# Patient Record
Sex: Male | Born: 1940 | Race: White | Hispanic: No | Marital: Married | Smoking: Former smoker
Health system: Southern US, Community
[De-identification: ages and names within clinical notes are randomized; demographics above are authoritative.]

## PROBLEM LIST (undated history)

## (undated) DIAGNOSIS — E785 Hyperlipidemia, unspecified: Secondary | ICD-10-CM

## (undated) DIAGNOSIS — T7840XA Allergy, unspecified, initial encounter: Secondary | ICD-10-CM

## (undated) DIAGNOSIS — D689 Coagulation defect, unspecified: Secondary | ICD-10-CM

## (undated) DIAGNOSIS — F419 Anxiety disorder, unspecified: Secondary | ICD-10-CM

## (undated) DIAGNOSIS — K56609 Unspecified intestinal obstruction, unspecified as to partial versus complete obstruction: Secondary | ICD-10-CM

## (undated) DIAGNOSIS — I639 Cerebral infarction, unspecified: Secondary | ICD-10-CM

## (undated) DIAGNOSIS — L570 Actinic keratosis: Secondary | ICD-10-CM

## (undated) DIAGNOSIS — F329 Major depressive disorder, single episode, unspecified: Secondary | ICD-10-CM

## (undated) DIAGNOSIS — G4700 Insomnia, unspecified: Secondary | ICD-10-CM

## (undated) DIAGNOSIS — W3400XA Accidental discharge from unspecified firearms or gun, initial encounter: Secondary | ICD-10-CM

## (undated) DIAGNOSIS — I10 Essential (primary) hypertension: Secondary | ICD-10-CM

## (undated) DIAGNOSIS — N189 Chronic kidney disease, unspecified: Secondary | ICD-10-CM

## (undated) DIAGNOSIS — I4891 Unspecified atrial fibrillation: Secondary | ICD-10-CM

## (undated) DIAGNOSIS — E119 Type 2 diabetes mellitus without complications: Secondary | ICD-10-CM

## (undated) DIAGNOSIS — I779 Disorder of arteries and arterioles, unspecified: Secondary | ICD-10-CM

## (undated) DIAGNOSIS — L989 Disorder of the skin and subcutaneous tissue, unspecified: Secondary | ICD-10-CM

## (undated) DIAGNOSIS — F32A Depression, unspecified: Secondary | ICD-10-CM

## (undated) DIAGNOSIS — Z8673 Personal history of transient ischemic attack (TIA), and cerebral infarction without residual deficits: Secondary | ICD-10-CM

## (undated) HISTORY — PX: WRIST SURGERY: SHX841

## (undated) HISTORY — DX: Major depressive disorder, single episode, unspecified: F32.9

## (undated) HISTORY — DX: Type 2 diabetes mellitus without complications: E11.9

## (undated) HISTORY — DX: Allergy, unspecified, initial encounter: T78.40XA

## (undated) HISTORY — DX: Essential (primary) hypertension: I10

## (undated) HISTORY — DX: Chronic kidney disease, unspecified: N18.9

## (undated) HISTORY — DX: Insomnia, unspecified: G47.00

## (undated) HISTORY — PX: FRACTURE SURGERY: SHX138

## (undated) HISTORY — DX: Cerebral infarction, unspecified: I63.9

## (undated) HISTORY — PX: HERNIA REPAIR: SHX51

## (undated) HISTORY — DX: Anxiety disorder, unspecified: F41.9

## (undated) HISTORY — DX: Actinic keratosis: L57.0

## (undated) HISTORY — DX: Accidental discharge from unspecified firearms or gun, initial encounter: W34.00XA

## (undated) HISTORY — DX: Unspecified intestinal obstruction, unspecified as to partial versus complete obstruction: K56.609

## (undated) HISTORY — DX: Disorder of the skin and subcutaneous tissue, unspecified: L98.9

## (undated) HISTORY — DX: Hyperlipidemia, unspecified: E78.5

## (undated) HISTORY — DX: Personal history of transient ischemic attack (TIA), and cerebral infarction without residual deficits: Z86.73

## (undated) HISTORY — PX: ELBOW SURGERY: SHX618

## (undated) HISTORY — DX: Coagulation defect, unspecified: D68.9

## (undated) HISTORY — DX: Depression, unspecified: F32.A

---

## 1977-02-04 DIAGNOSIS — W3400XA Accidental discharge from unspecified firearms or gun, initial encounter: Secondary | ICD-10-CM

## 1977-02-04 HISTORY — DX: Accidental discharge from unspecified firearms or gun, initial encounter: W34.00XA

## 2004-07-16 ENCOUNTER — Inpatient Hospital Stay: Payer: Self-pay | Admitting: Surgery

## 2005-02-04 DIAGNOSIS — Z8673 Personal history of transient ischemic attack (TIA), and cerebral infarction without residual deficits: Secondary | ICD-10-CM

## 2005-02-04 DIAGNOSIS — I639 Cerebral infarction, unspecified: Secondary | ICD-10-CM

## 2005-02-04 HISTORY — DX: Personal history of transient ischemic attack (TIA), and cerebral infarction without residual deficits: Z86.73

## 2005-02-04 HISTORY — DX: Cerebral infarction, unspecified: I63.9

## 2005-08-04 ENCOUNTER — Other Ambulatory Visit: Payer: Self-pay

## 2005-08-04 ENCOUNTER — Inpatient Hospital Stay: Payer: Self-pay | Admitting: Internal Medicine

## 2005-08-28 ENCOUNTER — Encounter: Payer: Self-pay | Admitting: Internal Medicine

## 2005-09-04 ENCOUNTER — Encounter: Payer: Self-pay | Admitting: Internal Medicine

## 2005-09-30 ENCOUNTER — Emergency Department: Payer: Self-pay | Admitting: Emergency Medicine

## 2005-10-05 ENCOUNTER — Encounter: Payer: Self-pay | Admitting: Internal Medicine

## 2005-10-11 ENCOUNTER — Emergency Department: Payer: Self-pay | Admitting: Emergency Medicine

## 2005-11-03 ENCOUNTER — Emergency Department: Payer: Self-pay | Admitting: Emergency Medicine

## 2005-11-04 ENCOUNTER — Encounter: Payer: Self-pay | Admitting: Internal Medicine

## 2005-12-05 ENCOUNTER — Encounter: Payer: Self-pay | Admitting: Internal Medicine

## 2005-12-17 ENCOUNTER — Ambulatory Visit: Payer: Self-pay | Admitting: Orthopedic Surgery

## 2006-01-04 ENCOUNTER — Encounter: Payer: Self-pay | Admitting: Internal Medicine

## 2006-01-13 ENCOUNTER — Ambulatory Visit: Payer: Self-pay | Admitting: *Deleted

## 2006-01-14 ENCOUNTER — Other Ambulatory Visit: Payer: Self-pay

## 2006-01-24 ENCOUNTER — Ambulatory Visit: Payer: Self-pay | Admitting: Orthopedic Surgery

## 2006-01-31 ENCOUNTER — Inpatient Hospital Stay: Payer: Self-pay | Admitting: Internal Medicine

## 2006-01-31 ENCOUNTER — Other Ambulatory Visit: Payer: Self-pay

## 2006-02-04 ENCOUNTER — Encounter: Payer: Self-pay | Admitting: Internal Medicine

## 2006-03-07 ENCOUNTER — Encounter: Payer: Self-pay | Admitting: Internal Medicine

## 2006-03-07 DIAGNOSIS — K56609 Unspecified intestinal obstruction, unspecified as to partial versus complete obstruction: Secondary | ICD-10-CM

## 2006-03-07 HISTORY — DX: Unspecified intestinal obstruction, unspecified as to partial versus complete obstruction: K56.609

## 2006-03-20 ENCOUNTER — Inpatient Hospital Stay: Payer: Self-pay | Admitting: General Surgery

## 2006-03-21 ENCOUNTER — Other Ambulatory Visit: Payer: Self-pay

## 2006-04-05 ENCOUNTER — Encounter: Payer: Self-pay | Admitting: Internal Medicine

## 2006-05-02 ENCOUNTER — Ambulatory Visit: Payer: Self-pay | Admitting: Family Medicine

## 2006-05-06 ENCOUNTER — Encounter: Payer: Self-pay | Admitting: Internal Medicine

## 2006-05-07 DIAGNOSIS — C4492 Squamous cell carcinoma of skin, unspecified: Secondary | ICD-10-CM

## 2006-05-07 HISTORY — DX: Squamous cell carcinoma of skin, unspecified: C44.92

## 2006-07-21 ENCOUNTER — Other Ambulatory Visit: Payer: Self-pay

## 2006-07-21 ENCOUNTER — Emergency Department: Payer: Self-pay | Admitting: Emergency Medicine

## 2006-10-23 ENCOUNTER — Ambulatory Visit: Payer: Self-pay | Admitting: Vascular Surgery

## 2006-11-13 ENCOUNTER — Ambulatory Visit: Payer: Self-pay | Admitting: Vascular Surgery

## 2006-11-26 ENCOUNTER — Inpatient Hospital Stay: Payer: Self-pay | Admitting: Vascular Surgery

## 2006-12-01 HISTORY — PX: CAROTID STENT: SHX1301

## 2007-12-03 ENCOUNTER — Ambulatory Visit: Payer: Self-pay | Admitting: Family Medicine

## 2008-12-09 ENCOUNTER — Other Ambulatory Visit: Payer: Self-pay | Admitting: Family Medicine

## 2009-04-06 ENCOUNTER — Other Ambulatory Visit: Payer: Self-pay | Admitting: Family Medicine

## 2009-09-09 ENCOUNTER — Other Ambulatory Visit: Payer: Self-pay | Admitting: Family Medicine

## 2010-03-09 ENCOUNTER — Other Ambulatory Visit: Payer: Self-pay | Admitting: Family Medicine

## 2010-08-22 ENCOUNTER — Emergency Department: Payer: Self-pay | Admitting: Emergency Medicine

## 2010-11-10 ENCOUNTER — Ambulatory Visit: Payer: Self-pay | Admitting: Family Medicine

## 2010-11-17 ENCOUNTER — Ambulatory Visit: Payer: Self-pay

## 2011-03-03 ENCOUNTER — Inpatient Hospital Stay: Payer: Self-pay | Admitting: Internal Medicine

## 2011-03-03 LAB — CK TOTAL AND CKMB (NOT AT ARMC)
CK, Total: 62 U/L (ref 35–232)
CK, Total: 93 U/L (ref 35–232)
CK-MB: <0.5 ng/mL — ABNORMAL LOW (ref 0.5–3.6)

## 2011-03-03 LAB — COMPREHENSIVE METABOLIC PANEL
Anion Gap: 13 (ref 7–16)
Bilirubin,Total: 0.5 mg/dL (ref 0.2–1.0)
Calcium, Total: 9.3 mg/dL (ref 8.5–10.1)
Chloride: 95 mmol/L — ABNORMAL LOW (ref 98–107)
Co2: 24 mmol/L (ref 21–32)
EGFR (African American): 53 — ABNORMAL LOW
Osmolality: 282 (ref 275–301)
Potassium: 4.1 mmol/L (ref 3.5–5.1)
SGOT(AST): 17 U/L (ref 15–37)

## 2011-03-03 LAB — CBC
HCT: 40.9 % (ref 40.0–52.0)
HGB: 13.5 g/dL (ref 13.0–18.0)
RBC: 4.47 10*6/uL (ref 4.40–5.90)
WBC: 23.4 10*3/uL — ABNORMAL HIGH (ref 3.8–10.6)

## 2011-03-03 LAB — DIFFERENTIAL
Basophil #: 0 10*3/uL (ref 0.0–0.1)
Basophil %: 0.1 %
Eosinophil #: 0 10*3/uL (ref 0.0–0.7)
Eosinophil %: 0 %
Lymphocyte #: 1.3 10*3/uL (ref 1.0–3.6)
Monocyte #: 1.6 10*3/uL — ABNORMAL HIGH (ref 0.0–0.7)
Neutrophil #: 20.5 10*3/uL — ABNORMAL HIGH (ref 1.4–6.5)

## 2011-03-03 LAB — MONONUCLEOSIS SCREEN: Mono Test: NEGATIVE

## 2011-03-03 LAB — TROPONIN I: Troponin-I: <0.02 ng/mL

## 2011-03-03 LAB — RAPID INFLUENZA A&B ANTIGENS

## 2011-03-03 LAB — PROTIME-INR: INR: 1

## 2011-03-03 LAB — APTT
Activated PTT: 34.8 secs (ref 23.6–35.9)
Activated PTT: 60.4 secs — ABNORMAL HIGH (ref 23.6–35.9)

## 2011-03-04 LAB — CBC WITH DIFFERENTIAL/PLATELET
Eosinophil: 1 %
HCT: 39.1 % — ABNORMAL LOW (ref 40.0–52.0)
HGB: 12.9 g/dL — ABNORMAL LOW (ref 13.0–18.0)
MCHC: 33 g/dL (ref 32.0–36.0)
MCV: 92 fL (ref 80–100)
Monocytes: 10 %
Platelet: 242 10*3/uL (ref 150–440)
RBC: 4.24 10*6/uL — ABNORMAL LOW (ref 4.40–5.90)
RDW: 14.5 % (ref 11.5–14.5)
Segmented Neutrophils: 67 %
WBC: 27.3 10*3/uL — ABNORMAL HIGH (ref 3.8–10.6)

## 2011-03-04 LAB — APTT
Activated PTT: 65.1 secs — ABNORMAL HIGH (ref 23.6–35.9)
Activated PTT: 80.8 secs — ABNORMAL HIGH (ref 23.6–35.9)

## 2011-03-04 LAB — COMPREHENSIVE METABOLIC PANEL
Alkaline Phosphatase: 49 U/L — ABNORMAL LOW (ref 50–136)
Anion Gap: 11 (ref 7–16)
Calcium, Total: 9 mg/dL (ref 8.5–10.1)
Chloride: 99 mmol/L (ref 98–107)
Co2: 24 mmol/L (ref 21–32)
Creatinine: 1.27 mg/dL (ref 0.60–1.30)
EGFR (Non-African Amer.): 60 — ABNORMAL LOW
Potassium: 3.7 mmol/L (ref 3.5–5.1)
SGOT(AST): 15 U/L (ref 15–37)
SGPT (ALT): 16 U/L

## 2011-03-04 LAB — CK TOTAL AND CKMB (NOT AT ARMC)
CK, Total: 56 U/L (ref 35–232)
CK-MB: <0.5 ng/mL — ABNORMAL LOW (ref 0.5–3.6)

## 2011-03-05 LAB — PROTIME-INR
INR: 1
Prothrombin Time: 13.6 secs (ref 11.5–14.7)

## 2011-03-05 LAB — APTT: Activated PTT: 62.8 secs — ABNORMAL HIGH (ref 23.6–35.9)

## 2011-03-06 LAB — HEMOGLOBIN: HGB: 13 g/dL (ref 13.0–18.0)

## 2011-03-19 LAB — EXPECTORATED SPUTUM ASSESSMENT W GRAM STAIN, RFLX TO RESP C

## 2011-05-15 ENCOUNTER — Other Ambulatory Visit: Payer: Self-pay | Admitting: Family Medicine

## 2011-05-15 LAB — LIPID PANEL
Cholesterol: 127 mg/dL (ref 0–200)
Ldl Cholesterol, Calc: 57 mg/dL (ref 0–100)
VLDL Cholesterol, Calc: 25 mg/dL (ref 5–40)

## 2011-05-15 LAB — RENAL FUNCTION PANEL
Albumin: 4 g/dL (ref 3.4–5.0)
Anion Gap: 8 (ref 7–16)
Calcium, Total: 9.5 mg/dL (ref 8.5–10.1)
Chloride: 103 mmol/L (ref 98–107)
Co2: 26 mmol/L (ref 21–32)
EGFR (African American): >60
EGFR (Non-African Amer.): >60
Osmolality: 281 (ref 275–301)
Potassium: 4.4 mmol/L (ref 3.5–5.1)
Sodium: 137 mmol/L (ref 136–145)

## 2011-05-15 LAB — CK: CK, Total: 87 U/L (ref 35–232)

## 2011-08-07 ENCOUNTER — Other Ambulatory Visit: Payer: Self-pay | Admitting: Family Medicine

## 2011-08-07 LAB — CBC WITH DIFFERENTIAL/PLATELET
Basophil #: 0.1 10*3/uL (ref 0.0–0.1)
Basophil %: 1 %
Eosinophil %: 1.5 %
HCT: 40.9 % (ref 40.0–52.0)
Lymphocyte #: 2.8 10*3/uL (ref 1.0–3.6)
Lymphocyte %: 25.6 %
MCV: 91 fL (ref 80–100)
Monocyte #: 1.2 x10 3/mm — ABNORMAL HIGH (ref 0.2–1.0)
Neutrophil #: 6.7 10*3/uL — ABNORMAL HIGH (ref 1.4–6.5)
RBC: 4.51 10*6/uL (ref 4.40–5.90)
RDW: 14.8 % — ABNORMAL HIGH (ref 11.5–14.5)
WBC: 10.9 10*3/uL — ABNORMAL HIGH (ref 3.8–10.6)

## 2012-05-20 ENCOUNTER — Other Ambulatory Visit: Payer: Self-pay | Admitting: Family Medicine

## 2012-05-20 LAB — RENAL FUNCTION PANEL
Anion Gap: 2 — ABNORMAL LOW (ref 7–16)
Chloride: 104 mmol/L (ref 98–107)
Co2: 30 mmol/L (ref 21–32)
Creatinine: 1.24 mg/dL (ref 0.60–1.30)
EGFR (Non-African Amer.): 59 — ABNORMAL LOW
Glucose: 141 mg/dL — ABNORMAL HIGH (ref 65–99)
Osmolality: 277 (ref 275–301)
Phosphorus: 3.2 mg/dL (ref 2.5–4.9)
Potassium: 4.9 mmol/L (ref 3.5–5.1)
Sodium: 136 mmol/L (ref 136–145)

## 2012-05-20 LAB — LIPID PANEL
Cholesterol: 131 mg/dL (ref 0–200)
HDL Cholesterol: 44 mg/dL (ref 40–60)
Ldl Cholesterol, Calc: 56 mg/dL (ref 0–100)
Triglycerides: 154 mg/dL (ref 0–200)
VLDL Cholesterol, Calc: 31 mg/dL (ref 5–40)

## 2012-12-15 ENCOUNTER — Emergency Department: Payer: Self-pay | Admitting: Emergency Medicine

## 2013-03-18 ENCOUNTER — Ambulatory Visit: Payer: Self-pay | Admitting: Unknown Physician Specialty

## 2013-03-18 LAB — HM COLONOSCOPY

## 2013-03-19 LAB — PATHOLOGY REPORT

## 2013-06-01 LAB — PSA: PSA: 0.2

## 2013-06-01 LAB — HEPATIC FUNCTION PANEL: ALT: 13 U/L (ref 10–40)

## 2013-06-01 LAB — CBC AND DIFFERENTIAL
HCT: 44 % (ref 41–53)
Hemoglobin: 15 g/dL (ref 13.5–17.5)
PLATELETS: 331 10*3/uL (ref 150–399)
WBC: 11 10^3/mL

## 2013-06-01 LAB — BASIC METABOLIC PANEL: Sodium: 141 mmol/L (ref 137–147)

## 2014-04-19 LAB — LIPID PANEL
CHOLESTEROL: 121 mg/dL (ref 0–200)
HDL: 46 mg/dL (ref 35–70)
LDL Cholesterol: 42 mg/dL
Triglycerides: 165 mg/dL — AB (ref 40–160)

## 2014-04-19 LAB — HEMOGLOBIN A1C: Hgb A1c MFr Bld: 7.6 % — AB (ref 4.0–6.0)

## 2014-04-19 LAB — BASIC METABOLIC PANEL
BUN: 29 mg/dL — AB (ref 4–21)
Creatinine: 1.4 mg/dL — AB (ref 0.6–1.3)
GLUCOSE: 151 mg/dL
POTASSIUM: 5.7 mmol/L — AB (ref 3.4–5.3)
Sodium: 140 mmol/L (ref 137–147)
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
## 2014-05-29 NOTE — Discharge Summary (Signed)
PATIENT NAME:  ALEXANDER, RONDINELLI MR#:  [id number] DATE OF BIRTH:  Apr 04, 1940  DATE OF ADMISSION:  03/03/2011 DATE OF DISCHARGE:  03/06/2011  PRIMARY CARE PHYSICIAN: Lelon Huh, MD   CARDIOLOGY:  Loran Senters. Callwood, MD    DISCHARGE DIAGNOSES:  1. Systemic inflammatory response syndrome likely due to strep pharyngitis as well as possibly from acute bronchitis, on Levaquin, improving.  2. Acute renal failure, likely prerenal, resolved with hydration.  3. Leukocytosis likely due to acute bronchitis, improving.  4. Atrial fibrillation with a rapid ventricular rate, on metoprolol to slow heart rate, normal thyroid-stimulating hormone, normal echocardiogram with negative serial cardiac enzymes, on Coumadin.  5. Diabetes, on metformin and diabetic diet.  6. Hypertension, stable on medication. Metoprolol added for better rate control.  7. Hyperlipidemia, on statin.   SECONDARY DIAGNOSES:  1. Carotid artery stenosis, status post stenting.  2. History of cerebrovascular accident.  3. Hypertension.  4. Hyperlipidemia.  5. Coronary artery disease.  6. Diabetes.  7. History of transient ischemic attack.   CONSULTATIONS: Cardiology, Dr. Clayborn Bigness.   LABORATORY, DIAGNOSTIC AND RADIOLOGICAL DATA:   Chest x-ray on the 27th of January showed no acute cardiopulmonary disease.  A 2-D echocardiogram on the 27th of January showed normal LV size, no thrombus, normal LV systolic function. Ejection fraction more than 55%.   Sputum culture grew heavy growth of gram-negative rods.  Blood culture was negative x1.  Influenza A plus B was negative.   HISTORY AND SHORT HOSPITAL COURSE: The patient is 74 year old male with the above-mentioned medical problems who was admitted for possible SIRS thought to be secondary to strep pharyngitis/tonsillitis and likely acute bronchitis. He was started on Levaquin. He was also found to be in acute renal failure with a creatinine of 1.66 on admission, was started on IV  hydration. His hydrochlorothiazide was held, and he was improving with hydration. He was also found to be in atrial fibrillation with rapid ventricular rate which was new onset. He did continue to stay in atrial fibrillation and had a normal TSH, negative serial cardiac enzymes. He was evaluated by Cardiology, Dr. Clayborn Bigness, who recommended to start on anticoagulation. He was started on Coumadin. He was already on heparin drip on admission and was slowly getting better. His rate was well controlled. He was feeling much better on 03/06/2011 and was discharged home in stable condition.   PERTINENT DISCHARGE PHYSICAL EXAMINATION: VITAL SIGNS: On the date of discharge, his vital signs were as follows: Temperature 98.1, heart rate 62 per minute, respirations 20 per minute, blood pressure 129/78 mmHg. He was saturating 97% on room air. CARDIOVASCULAR: Irregularly irregular heart rate, no murmurs, rubs, or gallops. LUNGS: Clear to auscultation bilaterally. No wheezes, rales, rhonchi, or crepitation. ABDOMEN: Soft, benign. NEUROLOGICAL: Nonfocal examination. All other physical examination remained at the baseline.   DISCHARGE MEDICATIONS:  1. Aspirin 81 mg p.o. daily.  2. Glucophage 850 mg p.o. b.i.d.  3. Simvastatin 20 mg p.o. at bedtime. 4. Lorazepam 0.5 mg p.o. at bedtime. 5. EpiPen as needed.  6. Coumadin 5 mg p.o. at bedtime.  7. Plavix 75 mg p.o. daily.  8. Metoprolol 25 mg p.o. b.i.d.  9. Hydrochlorothiazide/lisinopril 12.5/10 mg once daily.   DISCHARGE DIET: Low sodium, 1800 ADA, low cholesterol.   DISCHARGE ACTIVITY: As tolerated.   DISCHARGE INSTRUCTIONS AND FOLLOW-UP:  1. The patient was instructed to follow up with his primary care physician, Dr. Lelon Huh, in 1 to 2 weeks.  2. He was instructed to get PT-INR  check on Friday, the 1st of February, and Monday, the 4th of February, with results forwarded to primary care physician for Coumadin adjustment.  3. He will need follow-up with Dr.  Clayborn Bigness in 2 to 3 weeks.  4. He was also given instructions to be cautious for signs of bleeding, considering two or three anticoagulation agents, as he will be at high risk for any bleeding.   TOTAL TIME DISCHARGING THIS PATIENT: 45 minutes.   ____________________________ Lucina Mellow. Manuella Ghazi, MD vss:cbb D: 03/09/2011 13:29:27 ET T: 03/11/2011 12:03:47 ET JOB#: [id number]  cc: Hasan Douse S. Manuella Ghazi, MD, <Dictator> Kirstie Peri. Caryn Section, MD Dwayne D. Clayborn Bigness, MD Remer Macho MD ELECTRONICALLY SIGNED 03/11/2011 12:48
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
## 2014-06-01 DIAGNOSIS — Z91030 Bee allergy status: Secondary | ICD-10-CM | POA: Insufficient documentation

## 2014-06-01 DIAGNOSIS — I739 Peripheral vascular disease, unspecified: Secondary | ICD-10-CM

## 2014-06-01 DIAGNOSIS — I779 Disorder of arteries and arterioles, unspecified: Secondary | ICD-10-CM | POA: Insufficient documentation

## 2014-06-01 DIAGNOSIS — I480 Paroxysmal atrial fibrillation: Secondary | ICD-10-CM | POA: Insufficient documentation

## 2014-06-01 DIAGNOSIS — G4700 Insomnia, unspecified: Secondary | ICD-10-CM | POA: Insufficient documentation

## 2014-06-01 DIAGNOSIS — E785 Hyperlipidemia, unspecified: Secondary | ICD-10-CM | POA: Insufficient documentation

## 2014-06-01 DIAGNOSIS — E1121 Type 2 diabetes mellitus with diabetic nephropathy: Secondary | ICD-10-CM | POA: Insufficient documentation

## 2014-06-01 DIAGNOSIS — I10 Essential (primary) hypertension: Secondary | ICD-10-CM | POA: Insufficient documentation

## 2014-06-01 DIAGNOSIS — I69320 Aphasia following cerebral infarction: Secondary | ICD-10-CM | POA: Insufficient documentation

## 2014-06-01 DIAGNOSIS — F341 Dysthymic disorder: Secondary | ICD-10-CM | POA: Insufficient documentation

## 2014-07-21 ENCOUNTER — Ambulatory Visit (INDEPENDENT_AMBULATORY_CARE_PROVIDER_SITE_OTHER): Payer: 59 | Admitting: Family Medicine

## 2014-07-21 ENCOUNTER — Encounter: Payer: Self-pay | Admitting: Family Medicine

## 2014-07-21 VITALS — BP 120/70 | Temp 98.4°F | Resp 16 | Ht 66.0 in | Wt 166.0 lb

## 2014-07-21 DIAGNOSIS — I69920 Aphasia following unspecified cerebrovascular disease: Secondary | ICD-10-CM

## 2014-07-21 DIAGNOSIS — E785 Hyperlipidemia, unspecified: Secondary | ICD-10-CM | POA: Diagnosis not present

## 2014-07-21 DIAGNOSIS — I739 Peripheral vascular disease, unspecified: Secondary | ICD-10-CM

## 2014-07-21 DIAGNOSIS — Z86010 Personal history of colonic polyps: Secondary | ICD-10-CM

## 2014-07-21 DIAGNOSIS — Z860101 Personal history of adenomatous and serrated colon polyps: Secondary | ICD-10-CM | POA: Insufficient documentation

## 2014-07-21 DIAGNOSIS — I779 Disorder of arteries and arterioles, unspecified: Secondary | ICD-10-CM | POA: Diagnosis not present

## 2014-07-21 DIAGNOSIS — I480 Paroxysmal atrial fibrillation: Secondary | ICD-10-CM

## 2014-07-21 DIAGNOSIS — E119 Type 2 diabetes mellitus without complications: Secondary | ICD-10-CM | POA: Diagnosis not present

## 2014-07-21 DIAGNOSIS — Z1389 Encounter for screening for other disorder: Secondary | ICD-10-CM

## 2014-07-21 DIAGNOSIS — I10 Essential (primary) hypertension: Secondary | ICD-10-CM

## 2014-07-21 DIAGNOSIS — I69320 Aphasia following cerebral infarction: Secondary | ICD-10-CM

## 2014-07-21 LAB — POCT GLYCOSYLATED HEMOGLOBIN (HGB A1C)
Est. average glucose Bld gHb Est-mCnc: 146
Hemoglobin A1C: 6.7

## 2014-07-21 NOTE — Progress Notes (Signed)
[name] Male    DOB: 07-07-1940   74 y.o.   MRN: [id number] Visit Date: 07/21/2014  Today's Provider: Lelon Huh, MD   Chief Complaint  Patient presents with  . Follow-up  . Hyperlipidemia  . Diabetes  . Hypertension   Subjective:    Hyperlipidemia This is a chronic problem. The problem is controlled. Pertinent negatives include no chest pain, leg pain or shortness of breath. Current antihyperlipidemic treatment includes exercise. Risk factors for coronary artery disease include diabetes mellitus and hypertension.  Diabetes Pertinent negatives for hypoglycemia include no confusion. Pertinent negatives for diabetes include no chest pain and no fatigue.  Hypertension Pertinent negatives include no chest pain or shortness of breath.     Diabetes Mellitus Type II, Follow-up:   Lab Results  Component Value Date   HGBA1C 6.7 07/21/2014   HGBA1C 7.6* 04/19/2014   Last seen for diabetes 3 months ago.  Management changes included none. He reports good compliance with treatment. He is not having side effects. none  Home blood sugar records: fasting range: 113  Episodes of hypoglycemia? no   Weight trend: stable Current exercise: [place]  ------------------------------------------------------------------------   Hypertension, follow-up:  BP Readings from Last 3 Encounters:  07/21/14 120/70  05/10/14 100/54    He was last seen for hypertension 3 months ago.  BP at that visit was 126/80. Management changes since that visit include none  .He reports good compliance with treatment. He is not having side effects. none  He is not exercising. He is adherent to low salt diet.   Outside blood pressures are 120/70. He is experiencing none.  Patient denies none.   Cardiovascular risk factors include none.  Use of agents associated with hypertension: none.    ------------------------------------------------------------------------    Lipid/Cholesterol, Follow-up:   Last seen for this 3 months ago.  Management changes since that visit include none.  Last Lipid Panel:    Component Value Date/Time   CHOL 121 04/19/2014   TRIG 165* 04/19/2014   HDL 46 04/19/2014   LDLCALC 42 04/19/2014    He reports good compliance with treatment. (simvastatin He is not having side effects. none  Wt Readings from Last 3 Encounters:  07/21/14 166 lb (75.297 kg)  05/10/14 170 lb (77.111 kg)    ------------------------------------------------------------------------  Insomnia, Follow-up  He and his wife reports he has been sleeping well, taking lorazepam every night which helps quite a bit and he does not feel groggy in the morning. His memory has been good and having no adverse effects from medications.   ------------------------------------------------------------------------      Previous Medications   ASPIRIN 81 MG TABLET    Take 1 tablet by mouth daily.   CLOPIDOGREL (PLAVIX) 75 MG TABLET    Take 1 tablet by mouth daily.   EPINEPHRINE 0.3 MG/0.3 ML IJ SOAJ INJECTION    1 injection as directed   GLUCOSE BLOOD TEST STRIP    1 each by Other route as needed (check blood sugar 2-3 times daily). Use as instructed   LORAZEPAM (ATIVAN) 1 MG TABLET    Take 0.5-1 tablets by mouth at bedtime.   LOSARTAN-HYDROCHLOROTHIAZIDE (HYZAAR) 50-12.5 MG PER TABLET    Take 1 tablet by mouth daily.   METFORMIN (GLUCOPHAGE) 850 MG TABLET    Take 1 tablet by mouth 2 (two) times daily.   METOPROLOL TARTRATE (LOPRESSOR) 25 MG TABLET    Take 1 tablet by mouth 2 (two)  times daily.   SIMVASTATIN (ZOCOR) 20 MG TABLET    Take 1 tablet by mouth daily.    Review of Systems  Constitutional: Negative for fever, appetite change and fatigue.  Respiratory: Negative for shortness of breath.   Cardiovascular: Negative for chest pain.  Gastrointestinal: Negative for  abdominal pain.  Psychiatric/Behavioral: Negative for behavioral problems, confusion, decreased concentration and agitation.    History  Substance Use Topics  . Smoking status: Former Smoker -- 0.51 packs/day    Types: Cigarettes    Quit date: 08/23/2013  . Smokeless tobacco: Not on file  . Alcohol Use: No   Objective:   BP 120/70 mmHg  Temp(Src) 98.4 F (36.9 C) (Oral)  Resp 16  Ht 5\' 6"  (1.676 m)  Wt 166 lb (75.297 kg)  BMI 26.81 kg/m2  Physical Exam   General Appearance:    Alert, cooperative, no distress  Eyes:    PERRL, conjunctiva/corneas clear, EOM's intact       Lungs:     Clear to auscultation bilaterally, respirations unlabored  Heart:    Regular rate and rhythm  Neurologic:   Awake, alert, oriented x 3. No apparent focal neurological           defect. Diabetic foot exam done  Pulses  1+   Results for orders placed or performed in visit on 07/21/14  POCT HgB A1C  Result Value Ref Range   Hemoglobin A1C 6.7    Est. average glucose Bld gHb Est-mCnc 146         Assessment & Plan:     1. Type 2 diabetes mellitus without complication Well controlled. Continue current medications.   - POCT HgB A1C  2. HLD (hyperlipidemia) He is tolerating simvastatin well with no adverse effects.    3. Essential hypertension well controlled Continue current medications.   4. Intermittent atrial fibrillation In SR today.   5. Aphasia as late effect of cerebrovascular accident Recovered well, nearly to baseline  6. Carotid artery disease, unspecified laterality He has follow up with Dr. Lucky Cowboy next week. Continue current medications.      Follow up: Return in about 4 months (around 11/20/2014).

## 2014-07-25 NOTE — Patient Outreach (Signed)
[facility]) Care Management  07/25/2014  Bobby Thomas [birth year] [id number]   Received today, notes from a recent visit that [facility] had with Dr. Caryn Section on 07/21/14.  His BP was ideal at 120/60 and the MD has drawn an A1C.   [name], RN, BA, [place], [place] Direct Dial:  [phone]  Fax:  [phone] E-mail: [email or web address] [address], [place], [place]  [address]

## 2014-09-26 LAB — HM DIABETES EYE EXAM

## 2014-10-31 ENCOUNTER — Other Ambulatory Visit: Payer: Self-pay | Admitting: Family Medicine

## 2014-11-24 ENCOUNTER — Ambulatory Visit (INDEPENDENT_AMBULATORY_CARE_PROVIDER_SITE_OTHER): Payer: 59 | Admitting: Family Medicine

## 2014-11-24 ENCOUNTER — Encounter: Payer: Self-pay | Admitting: Family Medicine

## 2014-11-24 VITALS — BP 120/78 | HR 67 | Temp 97.8°F | Resp 16 | Wt 170.0 lb

## 2014-11-24 DIAGNOSIS — I779 Disorder of arteries and arterioles, unspecified: Secondary | ICD-10-CM | POA: Diagnosis not present

## 2014-11-24 DIAGNOSIS — I480 Paroxysmal atrial fibrillation: Secondary | ICD-10-CM | POA: Diagnosis not present

## 2014-11-24 DIAGNOSIS — Z23 Encounter for immunization: Secondary | ICD-10-CM

## 2014-11-24 DIAGNOSIS — E119 Type 2 diabetes mellitus without complications: Secondary | ICD-10-CM | POA: Diagnosis not present

## 2014-11-24 DIAGNOSIS — I10 Essential (primary) hypertension: Secondary | ICD-10-CM

## 2014-11-24 DIAGNOSIS — E785 Hyperlipidemia, unspecified: Secondary | ICD-10-CM

## 2014-11-24 DIAGNOSIS — I739 Peripheral vascular disease, unspecified: Secondary | ICD-10-CM

## 2014-11-24 LAB — POCT GLYCOSYLATED HEMOGLOBIN (HGB A1C)
Est. average glucose Bld gHb Est-mCnc: 151
HEMOGLOBIN A1C: 6.9

## 2014-11-24 NOTE — Progress Notes (Signed)
[name] Male    DOB: May 01, 1940   74 y.o.   MRN: [id number] Visit Date: 11/24/2014  Today's Provider: Lelon Huh, MD   Chief Complaint  Patient presents with  . Follow-up  . Hypertension  . Diabetes  . Hyperlipidemia   Subjective:    HPI   Diabetes Mellitus Type II, Follow-up:   Lab Results  Component Value Date   HGBA1C 6.7 07/21/2014   HGBA1C 7.6* 04/19/2014   Last seen for diabetes 4 months ago.  Management since then includes none. He reports good compliance with treatment. He is not having side effects. none Current symptoms include none and have been stable. Home blood sugar records: fasting range: 120  Episodes of hypoglycemia? no   Current Insulin Regimen: n/a Most Recent Eye Exam: 09/05/2014 Weight trend: stable Prior visit with dietician: no Current diet: well balanced Current exercise: walking  ------------------------------------------------------------------------   Hypertension, follow-up:  BP Readings from Last 3 Encounters:  11/24/14 120/78  07/21/14 120/70  05/10/14 100/54    He was last seen for hypertension 4 months ago.  BP at that visit was 120/70. Management since that visit includes none .He reports good compliance with treatment. He is not having side effects. none  He is exercising. He is adherent to low salt diet.   Outside blood pressures are normal. He is experiencing none.  Patient denies none.   Cardiovascular risk factors include diabetes mellitus.  Use of agents associated with hypertension: none.   ------------------------------------------------------------------------    Lipid/Cholesterol, Follow-up:   Last seen for this 4 months ago.  Management since that visit includes none.  Last Lipid Panel:    Component Value Date/Time   CHOL 121 04/19/2014   CHOL 131 05/20/2012 0940   TRIG 165* 04/19/2014   TRIG 154 05/20/2012 0940   HDL 46 04/19/2014   HDL 44 05/20/2012 0940   VLDL  31 05/20/2012 0940   LDLCALC 42 04/19/2014   LDLCALC 56 05/20/2012 0940    He reports good compliance with treatment. He is not having side effects. none  Wt Readings from Last 3 Encounters:  11/24/14 170 lb (77.111 kg)  07/21/14 166 lb (75.297 kg)  05/10/14 170 lb (77.111 kg)    ------------------------------------------------------------------------    Allergies  Allergen Reactions  . Bee Venom Other (See Comments)  . Codeine     Other reaction(s): Pruritus  . Morphine Sulfate     Other reaction(s): Pruritus  . Warfarin Sodium Diarrhea   Previous Medications   ASPIRIN 81 MG TABLET    Take 1 tablet by mouth daily.   CLOPIDOGREL (PLAVIX) 75 MG TABLET    TAKE 1 TABLET BY MOUTH DAILY   EPINEPHRINE 0.3 MG/0.3 ML IJ SOAJ INJECTION    1 injection as directed   GLUCOSE BLOOD TEST STRIP    1 each by Other route as needed (check blood sugar 2-3 times daily). Use as instructed   LORAZEPAM (ATIVAN) 1 MG TABLET    Take 0.5-1 tablets by mouth at bedtime.   LOSARTAN-HYDROCHLOROTHIAZIDE (HYZAAR) 50-12.5 MG PER TABLET    Take 1 tablet by mouth daily.   METFORMIN (GLUCOPHAGE) 850 MG TABLET    Take 1 tablet by mouth 2 (two) times daily.   METOPROLOL TARTRATE (LOPRESSOR) 25 MG TABLET    Take 1 tablet by mouth 2 (two) times daily.   SIMVASTATIN (ZOCOR) 20 MG TABLET    Take 1 tablet by mouth daily.    Review of  Systems  Cardiovascular: Negative for chest pain and palpitations.  Neurological: Negative for dizziness, light-headedness and headaches.    Social History  Substance Use Topics  . Smoking status: Former Smoker -- 0.51 packs/day    Types: Cigarettes    Quit date: 08/23/2013  . Smokeless tobacco: Not on file  . Alcohol Use: No   Objective:   BP 120/78 mmHg  Pulse 67  Temp(Src) 97.8 F (36.6 C) (Oral)  Resp 16  Wt 170 lb (77.111 kg)  SpO2 97%   Fall Risk  11/24/2014  Falls in the past year? No    Physical Exam   General Appearance:    Alert, cooperative, no  distress  Eyes:    PERRL, conjunctiva/corneas clear, EOM's intact       Lungs:     Clear to auscultation bilaterally, respirations unlabored  Heart:    Regular rate and rhythm  Neurologic:   Awake, alert, oriented x 3. No apparent focal neurological           defect.       Results for orders placed or performed in visit on 11/24/14  POCT glycosylated hemoglobin (Hb A1C)  Result Value Ref Range   Hemoglobin A1C 6.9    Est. average glucose Bld gHb Est-mCnc 151        Assessment & Plan:     1. Essential hypertension Well controlled.  Continue current medications.   - EKG 12-Lead  2. Type 2 diabetes mellitus without complication, without long-term current use of insulin (HCC) Well controlled.  Continue current medications.   - POCT glycosylated hemoglobin (Hb A1C) - Renal function panel  3. Carotid artery disease, unspecified laterality ([place]) Continue routine follow up with vascular.   4. HLD (hyperlipidemia) He is tolerating simvastatin well with no adverse effects.    5. Need for influenza vaccination  - Flu vaccine HIGH DOSE PF  6. Intermittent atrial fibrillation (HCC) Asymptomatic. Did not tolerate warfarin and patient not willing to try Xarelto or Pradaxa. Is doing well with ASA and Plavix therapy which he would prefer to continue,   Return in about 6 months (around 05/25/2015).        Lelon Huh, MD  [facility]

## 2014-11-25 ENCOUNTER — Encounter: Payer: Self-pay | Admitting: Family Medicine

## 2014-11-25 ENCOUNTER — Telehealth: Payer: Self-pay

## 2014-11-25 ENCOUNTER — Telehealth: Payer: Self-pay | Admitting: Family Medicine

## 2014-11-25 DIAGNOSIS — N183 Chronic kidney disease, stage 3 (moderate): Secondary | ICD-10-CM

## 2014-11-25 DIAGNOSIS — N184 Chronic kidney disease, stage 4 (severe): Secondary | ICD-10-CM | POA: Insufficient documentation

## 2014-11-25 LAB — RENAL FUNCTION PANEL
ALBUMIN: 4.6 g/dL (ref 3.5–4.8)
BUN/Creatinine Ratio: 23 — ABNORMAL HIGH (ref 10–22)
BUN: 39 mg/dL — AB (ref 8–27)
CO2: 21 mmol/L (ref 18–29)
Calcium: 10.4 mg/dL — ABNORMAL HIGH (ref 8.6–10.2)
Chloride: 98 mmol/L (ref 97–106)
Creatinine, Ser: 1.67 mg/dL — ABNORMAL HIGH (ref 0.76–1.27)
GFR, EST AFRICAN AMERICAN: 47 mL/min/{1.73_m2} — AB (ref >59)
GFR, EST NON AFRICAN AMERICAN: 40 mL/min/{1.73_m2} — AB (ref >59)
GLUCOSE: 99 mg/dL (ref 65–99)
PHOSPHORUS: 4.2 mg/dL (ref 2.5–4.5)
POTASSIUM: 5.6 mmol/L — AB (ref 3.5–5.2)
Sodium: 140 mmol/L (ref 136–144)

## 2014-11-25 NOTE — Telephone Encounter (Signed)
Wife advised, wife states patient drinks a lot of water and gets up at night 3 times because of the fluid intake. She states that they thought one of the medication could have caused this. Please review. Anything else that patient can do to help this or change anything with his medication-aa

## 2014-11-25 NOTE — Telephone Encounter (Signed)
Pt stating he is returning a call from the nurse.  CB# [phone]  CC

## 2014-11-25 NOTE — Telephone Encounter (Signed)
Patient notified

## 2014-11-28 ENCOUNTER — Other Ambulatory Visit: Payer: Self-pay | Admitting: Family Medicine

## 2014-11-28 NOTE — Telephone Encounter (Signed)
Please call in alprazolam.  

## 2014-11-30 ENCOUNTER — Other Ambulatory Visit: Payer: Self-pay | Admitting: Family Medicine

## 2014-11-30 NOTE — Telephone Encounter (Signed)
Please call in lorazepam.  

## 2014-12-01 NOTE — Telephone Encounter (Signed)
Rx called in to pharmacy. 

## 2014-12-26 ENCOUNTER — Other Ambulatory Visit: Payer: Self-pay | Admitting: Family Medicine

## 2015-03-04 ENCOUNTER — Emergency Department: Payer: 59

## 2015-03-04 ENCOUNTER — Emergency Department
Admission: EM | Admit: 2015-03-04 | Discharge: 2015-03-04 | Disposition: A | Discharge status: Home / Self Care | Payer: 59 | Attending: Emergency Medicine | Admitting: Emergency Medicine

## 2015-03-04 DIAGNOSIS — Z7902 Long term (current) use of antithrombotics/antiplatelets: Secondary | ICD-10-CM | POA: Insufficient documentation

## 2015-03-04 DIAGNOSIS — S5001XA Contusion of right elbow, initial encounter: Secondary | ICD-10-CM | POA: Diagnosis not present

## 2015-03-04 DIAGNOSIS — E1121 Type 2 diabetes mellitus with diabetic nephropathy: Secondary | ICD-10-CM | POA: Insufficient documentation

## 2015-03-04 DIAGNOSIS — Z7982 Long term (current) use of aspirin: Secondary | ICD-10-CM | POA: Diagnosis not present

## 2015-03-04 DIAGNOSIS — W010XXA Fall on same level from slipping, tripping and stumbling without subsequent striking against object, initial encounter: Secondary | ICD-10-CM | POA: Diagnosis not present

## 2015-03-04 DIAGNOSIS — Y998 Other external cause status: Secondary | ICD-10-CM | POA: Insufficient documentation

## 2015-03-04 DIAGNOSIS — Z87891 Personal history of nicotine dependence: Secondary | ICD-10-CM | POA: Insufficient documentation

## 2015-03-04 DIAGNOSIS — Y9269 Other specified industrial and construction area as the place of occurrence of the external cause: Secondary | ICD-10-CM | POA: Diagnosis not present

## 2015-03-04 DIAGNOSIS — Z79899 Other long term (current) drug therapy: Secondary | ICD-10-CM | POA: Diagnosis not present

## 2015-03-04 DIAGNOSIS — S59901A Unspecified injury of right elbow, initial encounter: Secondary | ICD-10-CM | POA: Diagnosis present

## 2015-03-04 DIAGNOSIS — Y9389 Activity, other specified: Secondary | ICD-10-CM | POA: Diagnosis not present

## 2015-03-04 DIAGNOSIS — Z7984 Long term (current) use of oral hypoglycemic drugs: Secondary | ICD-10-CM | POA: Insufficient documentation

## 2015-03-04 DIAGNOSIS — M25521 Pain in right elbow: Secondary | ICD-10-CM | POA: Diagnosis not present

## 2015-03-04 MED ORDER — TRAMADOL HCL 50 MG PO TABS: 50.0000 mg | ORAL_TABLET | Freq: Four times a day (QID) | ORAL | Status: DC | PRN

## 2015-03-04 NOTE — ED Notes (Signed)
Pt reports he was repairing the floor in his shop when he tripped and fell to on his right elbow with swelling. Denies hitting head or LOC. Takes plavix. Denies pain any other place

## 2015-03-04 NOTE — Discharge Instructions (Signed)
Elbow Contusion An elbow contusion is a deep bruise of the elbow. Contusions are the result of an injury that caused bleeding under the skin. The contusion may turn blue, purple, or yellow. Minor injuries will give you a painless contusion, but more severe contusions may stay painful and swollen for a few weeks.  CAUSES  An elbow contusion comes from a direct force to that area, such as falling on the elbow. SYMPTOMS   Swelling and redness of the elbow.  Bruising of the elbow area.  Tenderness or soreness of the elbow. DIAGNOSIS  You will have a physical exam and will be asked about your history. You may need an X-ray of your elbow to look for a broken bone (fracture).  TREATMENT  A sling or splint may be needed to support your injury. Resting, elevating, and applying cold compresses to the elbow area are often the best treatments for an elbow contusion. Over-the-counter medicines may also be recommended for pain control. HOME CARE INSTRUCTIONS   Put ice on the injured area.  Put ice in a plastic bag.  Place a towel between your skin and the bag.  Leave the ice on for 15-20 minutes, 03-04 times a day.  Only take over-the-counter or prescription medicines for pain, discomfort, or fever as directed by your caregiver.  Rest your injured elbow until the pain and swelling are better.  Elevate your elbow to reduce swelling.  Apply a compression wrap as directed by your caregiver. This can help reduce swelling and motion. You may remove the wrap for sleeping, showers, and baths. If your fingers become numb, cold, or blue, take the wrap off and reapply it more loosely.  Use your elbow only as directed by your caregiver. You may be asked to do range of motion exercises. Do them as directed.  See your caregiver as directed. It is very important to keep all follow-up appointments in order to avoid any long-term problems with your elbow, including chronic pain or inability to move your elbow  normally. SEEK IMMEDIATE MEDICAL CARE IF:   You have increased redness, swelling, or pain in your elbow.  Your swelling or pain is not relieved with medicines.  You have swelling of the hand and fingers.  You are unable to move your fingers or wrist.  You begin to lose feeling in your hand or fingers.  Your fingers or hand become cold or blue. MAKE SURE YOU:   Understand these instructions.  Will watch your condition.  Will get help right away if you are not doing well or get worse.   This information is not intended to replace advice given to you by your health care provider. Make sure you discuss any questions you have with your health care provider.   Document Released: 12/30/2005 Document Revised: 04/15/2011 Document Reviewed: 09/05/2014 Elsevier Interactive Patient Education [employer].
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
## 2015-03-13 ENCOUNTER — Telehealth: Payer: Self-pay

## 2015-03-16 DIAGNOSIS — M25521 Pain in right elbow: Secondary | ICD-10-CM | POA: Diagnosis not present

## 2015-03-23 ENCOUNTER — Ambulatory Visit (INDEPENDENT_AMBULATORY_CARE_PROVIDER_SITE_OTHER): Payer: 59 | Admitting: Family Medicine

## 2015-03-23 ENCOUNTER — Encounter: Payer: Self-pay | Admitting: Family Medicine

## 2015-03-23 VITALS — BP 122/62 | HR 60 | Temp 97.4°F | Resp 16 | Wt 173.0 lb

## 2015-03-23 DIAGNOSIS — I10 Essential (primary) hypertension: Secondary | ICD-10-CM

## 2015-03-23 DIAGNOSIS — E785 Hyperlipidemia, unspecified: Secondary | ICD-10-CM | POA: Diagnosis not present

## 2015-03-23 DIAGNOSIS — I779 Disorder of arteries and arterioles, unspecified: Secondary | ICD-10-CM

## 2015-03-23 DIAGNOSIS — N183 Chronic kidney disease, stage 3 unspecified: Secondary | ICD-10-CM

## 2015-03-23 DIAGNOSIS — E1121 Type 2 diabetes mellitus with diabetic nephropathy: Secondary | ICD-10-CM | POA: Diagnosis not present

## 2015-03-23 DIAGNOSIS — I6990 Unspecified sequelae of unspecified cerebrovascular disease: Secondary | ICD-10-CM | POA: Diagnosis not present

## 2015-03-23 DIAGNOSIS — I739 Peripheral vascular disease, unspecified: Secondary | ICD-10-CM

## 2015-03-23 LAB — POCT GLYCOSYLATED HEMOGLOBIN (HGB A1C)
ESTIMATED AVERAGE GLUCOSE: 148
Hemoglobin A1C: 6.8

## 2015-03-23 NOTE — Progress Notes (Signed)
[name] Male    DOB: February 15, 1940   75 y.o.   MRN: [id number] Visit Date: 03/23/2015  Today's Provider: Lelon Huh, MD   Chief Complaint  Patient presents with  . Diabetes    follow up  . Hypertension    follow up  . Hyperlipidemia    follow up   Subjective:    HPI  Diabetes Mellitus Type II, Follow-up:   Lab Results  Component Value Date   HGBA1C 6.9 11/24/2014   HGBA1C 6.7 07/21/2014   HGBA1C 7.6* 04/19/2014   Last seen for diabetes 4 months ago.  Management since then includes no changes. He reports good compliance with treatment. He is not having side effects.  Current symptoms include none and have been stable. Home blood sugar records: fasting range: 120-130  Episodes of hypoglycemia? no   Current Insulin Regimen: none Most Recent Eye Exam: 09/2014 Weight trend: fluctuating a bit Prior visit with dietician: no Current diet: in general, a "healthy" diet   Current exercise: walking  ------------------------------------------------------------------------   Hypertension, follow-up:  BP Readings from Last 3 Encounters:  03/23/15 122/62  03/04/15 137/81  11/24/14 120/78    He was last seen for hypertension 4 months ago.  BP at that visit was 120/78. Management since that visit includes no changes.He reports good compliance with treatment. He is not having side effects.  He is exercising. He is adherent to low salt diet.   Outside blood pressures are not being checked. He is experiencing none.  Patient denies chest pain, chest pressure/discomfort, claudication, dyspnea, exertional chest pressure/discomfort, fatigue, irregular heart beat, lower extremity edema, near-syncope, orthopnea, palpitations, paroxysmal nocturnal dyspnea, syncope and tachypnea.   Cardiovascular risk factors include advanced age (older than 22 for men, 71 for women), diabetes mellitus, hypertension and male gender.  Use of agents associated with  hypertension: NSAIDS.   ------------------------------------------------------------------------    Lipid/Cholesterol, Follow-up:   Last seen for this 4 months ago.  Management since that visit includes no changes.  Last Lipid Panel:    Component Value Date/Time   CHOL 121 04/19/2014   CHOL 131 05/20/2012 0940   TRIG 165* 04/19/2014   TRIG 154 05/20/2012 0940   HDL 46 04/19/2014   HDL 44 05/20/2012 0940   VLDL 31 05/20/2012 0940   LDLCALC 42 04/19/2014   LDLCALC 56 05/20/2012 0940    He reports good compliance with treatment. He is not having side effects.   Wt Readings from Last 3 Encounters:  03/23/15 173 lb (78.472 kg)  03/04/15 160 lb (72.576 kg)  11/24/14 170 lb (77.111 kg)    ------------------------------------------------------------------------  Follow up CAD: Last office visit was 4 months ago and no changes were made. Patient was to continue to follow up with Vascular. Patient states he see's Dr. Lucky Cowboy once a year for carotid evaluation, and the next follow up is scheduled 07/2015.     Allergies  Allergen Reactions  . Bee Venom Other (See Comments)  . Codeine     Other reaction(s): Pruritus  . Morphine Sulfate     Other reaction(s): Pruritus  . Warfarin Sodium Diarrhea   Previous Medications   ASPIRIN 81 MG TABLET    Take 1 tablet by mouth daily.   CLOPIDOGREL (PLAVIX) 75 MG TABLET    TAKE 1 TABLET BY MOUTH DAILY   EPINEPHRINE 0.3 MG/0.3 ML IJ SOAJ INJECTION    1 injection as directed   GLUCOSE BLOOD TEST STRIP  1 each by Other route as needed (check blood sugar 2-3 times daily). Use as instructed   LORAZEPAM (ATIVAN) 1 MG TABLET    TAKE 1/2 TO 1 TABLET BY MOUTH AT BEDTIME   LOSARTAN-HYDROCHLOROTHIAZIDE (HYZAAR) 50-12.5 MG PER TABLET    Take 1 tablet by mouth daily.   METFORMIN (GLUCOPHAGE) 850 MG TABLET    Take 1 tablet by mouth 2 (two) times daily.   METOPROLOL TARTRATE (LOPRESSOR) 25 MG TABLET    TAKE 1 TABLET BY MOUTH TWICE DAILY   SIMVASTATIN  (ZOCOR) 20 MG TABLET    Take 1 tablet (20 mg total) by mouth daily.    Review of Systems  Constitutional: Negative for fever, chills and appetite change.  Respiratory: Negative for chest tightness, shortness of breath and wheezing.   Cardiovascular: Negative for chest pain and palpitations.  Gastrointestinal: Negative for nausea, vomiting and abdominal pain.  Endocrine: Negative for cold intolerance, heat intolerance, polydipsia, polyphagia and polyuria.    Social History  Substance Use Topics  . Smoking status: Former Smoker -- 0.51 packs/day    Types: Cigarettes    Quit date: 08/23/2013  . Smokeless tobacco: Not on file  . Alcohol Use: No   Objective:   BP 122/62 mmHg  Pulse 60  Temp(Src) 97.4 F (36.3 C) (Oral)  Resp 16  Wt 173 lb (78.472 kg)  Physical Exam    General Appearance:    Alert, cooperative, no distress  Eyes:    PERRL, conjunctiva/corneas clear, EOM's intact       Lungs:     Clear to auscultation bilaterally, respirations unlabored  Heart:    Regular rate and rhythm  Neurologic:   Awake, alert, oriented x 3. No apparent focal neurological           defect.        Results for orders placed or performed in visit on 03/23/15  POCT HgB A1C  Result Value Ref Range   Hemoglobin A1C 6.8    Est. average glucose Bld gHb Est-mCnc 148        Assessment & Plan:     1. Controlled type 2 diabetes mellitus with diabetic nephropathy, without long-term current use of insulin (HCC)  - POCT HgB A1C  2. HLD (hyperlipidemia) He is tolerating simvastatin well with no adverse effects.   - Lipid panel - Hepatic function panel  3. Essential hypertension Well controlled.  Continue current medications.   - Renal function panel  4. Chronic kidney disease (CKD), stage III (moderate)  - Renal function panel       Lelon Huh, MD  [facility]

## 2015-03-24 LAB — LIPID PANEL
CHOL/HDL RATIO: 3.3 ratio (ref 0.0–5.0)
Cholesterol, Total: 155 mg/dL (ref 100–199)
HDL: 47 mg/dL (ref >39)
LDL CALC: 70 mg/dL (ref 0–99)
TRIGLYCERIDES: 191 mg/dL — AB (ref 0–149)
VLDL Cholesterol Cal: 38 mg/dL (ref 5–40)

## 2015-03-24 LAB — RENAL FUNCTION PANEL
Albumin: 4.5 g/dL (ref 3.5–4.8)
BUN / CREAT RATIO: 26 — AB (ref 10–22)
BUN: 36 mg/dL — ABNORMAL HIGH (ref 8–27)
CO2: 23 mmol/L (ref 18–29)
CREATININE: 1.39 mg/dL — AB (ref 0.76–1.27)
Calcium: 10.6 mg/dL — ABNORMAL HIGH (ref 8.6–10.2)
Chloride: 100 mmol/L (ref 96–106)
GFR calc Af Amer: 58 mL/min/{1.73_m2} — ABNORMAL LOW (ref >59)
GFR, EST NON AFRICAN AMERICAN: 50 mL/min/{1.73_m2} — AB (ref >59)
Glucose: 148 mg/dL — ABNORMAL HIGH (ref 65–99)
PHOSPHORUS: 3.7 mg/dL (ref 2.5–4.5)
Potassium: 6.2 mmol/L — ABNORMAL HIGH (ref 3.5–5.2)
SODIUM: 140 mmol/L (ref 134–144)

## 2015-03-24 LAB — HEPATIC FUNCTION PANEL
ALT: 15 IU/L (ref 0–44)
AST: 18 IU/L (ref 0–40)
Alkaline Phosphatase: 59 IU/L (ref 39–117)
BILIRUBIN TOTAL: 1.1 mg/dL (ref 0.0–1.2)
Bilirubin, Direct: 0.22 mg/dL (ref 0.00–0.40)
Total Protein: 7.6 g/dL (ref 6.0–8.5)

## 2015-03-29 ENCOUNTER — Other Ambulatory Visit: Payer: Self-pay | Admitting: Pharmacist

## 2015-03-30 NOTE — Patient Outreach (Signed)
[facility]) Care Management  [place]   03/30/2015  Bobby Thomas 09-16-1940 [id number]  Subjective: Bobby Thomas was here today for his initial Link To Wellness visit with the pharmacist. 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 He also had left carotid stenosis status post stenting 2008. He was able to discuss the basics of his disease states and answer most of the questions asked.   Objective:  Blood Pressure: 146/96 mmHG. Patient states this is usually much lower. A1c: 6.8% 03/23/15 Lipid Panel (03/23/15): TC = 155 mg/dl; TG = 191 mg/dl; HDL = 47 mg/dl; LDL = 70 mg/dl. Renal Panel (03/23/15): BUN 36 mg/dl; Scr = 1.39 mg/dl; GFR = 50 ml/min/1.73; K = 6.2 mmol/L  Current Medications: Current Outpatient Prescriptions  Medication Sig Dispense Refill  . aspirin 81 MG tablet Take 1 tablet by mouth daily.    . clopidogrel (PLAVIX) 75 MG tablet TAKE 1 TABLET BY MOUTH DAILY 30 tablet 12  . EPINEPHrine 0.3 mg/0.3 mL IJ SOAJ injection 1 injection as directed    . glucose blood test strip 1 each by Other route as needed (check blood sugar 2-3 times daily). Use as instructed    . LORazepam (ATIVAN) 1 MG tablet TAKE 1/2 TO 1 TABLET BY MOUTH AT BEDTIME 90 tablet 1  . losartan-hydrochlorothiazide (HYZAAR) 50-12.5 MG per tablet Take 1 tablet by mouth daily.    . metFORMIN (GLUCOPHAGE) 850 MG tablet Take 1 tablet by mouth 2 (two) times daily.    . metoprolol tartrate (LOPRESSOR) 25 MG tablet TAKE 1 TABLET BY MOUTH TWICE DAILY 60 tablet 11  . simvastatin (ZOCOR) 20 MG tablet Take 1 tablet (20 mg total) by mouth daily. 30 tablet 12   No current facility-administered medications for this visit.    Functional Status: In your present state of health, do you have any difficulty performing the following activities: 03/29/2015 03/29/2015  Hearing? - N  Vision? - N  Difficulty concentrating or making decisions? Bobby Thomas  Walking or climbing stairs? - N  Dressing  or bathing? - N  Doing errands, shopping? - N    Fall/Depression Screening: PHQ 2/9 Scores 03/29/2015  PHQ - 2 Score 0    THN CM Care Plan Problem One        Most Recent Value   Care Plan Problem One  Maintain A1c at goal   Role Documenting the Problem One  Clinical Pharmacist   Care Plan for Problem One  Active   THN Long Term Goal (31-90 days)  Over the next 90 days patient will maintain A1c below 7% evidenced by patient report or lab value   THN Long Term Goal Start Date  03/29/15   Interventions for Problem One Long Term Goal  Patient will continue to attend physician appointments. Patient will continue exercising 5 days per week for at least 30 minutes per session. Patient will continue current medications.      Assessment: 1. Diabetes: A1c within goal of less than 7%. 2. Cholesterol: TC within goal of less than 200 mg/dl; HDL within goal of greater than 40 mg/dl; LDL within goal of less than 100 mg/dl; TG not within goal of less than 150 mg/dl. 3. Hypertension: Blood Pressure not within goal of less than 140/90 mmHG. 4. Renal Function: Stage III chronic kidney disease per chart. Lab values indicate slight improvement from October 2016 values.   Plan: 1. Patient has an appointment scheduled with Dr. Caryn Thomas  on 09/21/15 for Diabetes management. 2. Patient will follow up with the pharmacist in 6 months for the Link To Wellness visit. 3. Encouraged patient to continue going to all appointments and continue exercising 5 days per week.  Bobby Thomas K. [name], PharmD [facility] [phone]

## 2015-05-01 ENCOUNTER — Other Ambulatory Visit: Payer: Self-pay | Admitting: Family Medicine

## 2015-05-08 DIAGNOSIS — L820 Inflamed seborrheic keratosis: Secondary | ICD-10-CM | POA: Diagnosis not present

## 2015-05-08 DIAGNOSIS — Z85828 Personal history of other malignant neoplasm of skin: Secondary | ICD-10-CM | POA: Diagnosis not present

## 2015-05-08 DIAGNOSIS — L578 Other skin changes due to chronic exposure to nonionizing radiation: Secondary | ICD-10-CM | POA: Diagnosis not present

## 2015-05-08 DIAGNOSIS — L570 Actinic keratosis: Secondary | ICD-10-CM | POA: Diagnosis not present

## 2015-05-08 DIAGNOSIS — Z1283 Encounter for screening for malignant neoplasm of skin: Secondary | ICD-10-CM | POA: Diagnosis not present

## 2015-05-08 DIAGNOSIS — L821 Other seborrheic keratosis: Secondary | ICD-10-CM | POA: Diagnosis not present

## 2015-05-15 ENCOUNTER — Other Ambulatory Visit: Payer: Self-pay | Admitting: Family Medicine

## 2015-05-29 ENCOUNTER — Other Ambulatory Visit: Payer: Self-pay | Admitting: Family Medicine

## 2015-05-29 NOTE — Telephone Encounter (Signed)
Please call in alprazolam.  

## 2015-05-29 NOTE — Telephone Encounter (Signed)
Rx called in to pharmacy. 

## 2015-06-09 ENCOUNTER — Encounter: Payer: Self-pay | Admitting: *Deleted

## 2015-06-09 DIAGNOSIS — H5203 Hypermetropia, bilateral: Secondary | ICD-10-CM | POA: Diagnosis not present

## 2015-06-09 LAB — HM DIABETES EYE EXAM

## 2015-06-19 ENCOUNTER — Other Ambulatory Visit: Payer: Self-pay | Admitting: Family Medicine

## 2015-07-07 ENCOUNTER — Encounter (INDEPENDENT_AMBULATORY_CARE_PROVIDER_SITE_OTHER): Payer: Self-pay

## 2015-09-01 DIAGNOSIS — E785 Hyperlipidemia, unspecified: Secondary | ICD-10-CM | POA: Diagnosis not present

## 2015-09-01 DIAGNOSIS — E119 Type 2 diabetes mellitus without complications: Secondary | ICD-10-CM | POA: Diagnosis not present

## 2015-09-01 DIAGNOSIS — I6523 Occlusion and stenosis of bilateral carotid arteries: Secondary | ICD-10-CM | POA: Diagnosis not present

## 2015-09-01 DIAGNOSIS — I10 Essential (primary) hypertension: Secondary | ICD-10-CM | POA: Diagnosis not present

## 2015-09-21 ENCOUNTER — Encounter: Payer: Self-pay | Admitting: Family Medicine

## 2015-09-21 ENCOUNTER — Ambulatory Visit (INDEPENDENT_AMBULATORY_CARE_PROVIDER_SITE_OTHER): Payer: 59 | Admitting: Family Medicine

## 2015-09-21 VITALS — BP 126/80 | HR 88 | Temp 97.7°F | Resp 16 | Ht 67.0 in | Wt 170.0 lb

## 2015-09-21 DIAGNOSIS — E1121 Type 2 diabetes mellitus with diabetic nephropathy: Secondary | ICD-10-CM

## 2015-09-21 DIAGNOSIS — I10 Essential (primary) hypertension: Secondary | ICD-10-CM | POA: Diagnosis not present

## 2015-09-21 LAB — POCT GLYCOSYLATED HEMOGLOBIN (HGB A1C)
Est. average glucose Bld gHb Est-mCnc: 166
Hemoglobin A1C: 7.4

## 2015-09-21 NOTE — Progress Notes (Signed)
[name] Male    DOB: 24-Feb-1940   75 y.o.   MRN: [id number] Visit Date: 09/21/2015  Today's Provider: Lelon Huh, MD   Chief Complaint  Patient presents with  . Diabetes  . Hypertension  . Hyperlipidemia  . Chronic Kidney Disease   Subjective:    HPI  Diabetes Mellitus Type II, Follow-up:   Lab Results  Component Value Date   HGBA1C 7.4 09/21/2015   HGBA1C 6.8 03/23/2015   HGBA1C 6.9 11/24/2014   Last seen for diabetes 6 months ago.  Management since then includes no changes. He reports excellent compliance with treatment. He is not having side effects.  Current symptoms include none and have been stable. Home blood sugar records: fasting range: 120-130  Episodes of hypoglycemia? no   Current Insulin Regimen: none Most Recent Eye Exam: yes Weight trend: stable Prior visit with dietician: no Current diet: in general, a "healthy" diet   Current exercise: walking and yard work  ------------------------------------------------------------------------   Hypertension, follow-up:  BP Readings from Last 3 Encounters:  09/21/15 126/80  03/29/15 (!) 146/96  03/23/15 122/62    He was last seen for hypertension 6 months ago.  BP at that visit was 122/62. Management since that visit includes no changes.He reports excellent compliance with treatment. He is not having side effects.  He is exercising. He is adherent to low salt diet.   Outside blood pressures are stable. He is experiencing none.  Patient denies chest pain.   Cardiovascular risk factors include diabetes mellitus and hypertension.  Use of agents associated with hypertension: none.   ------------------------------------------------------------------------    Lipid/Cholesterol, Follow-up:   Last seen for this 6 months ago.  Management since that visit includes no changes.  Last Lipid Panel:    Component Value Date/Time   CHOL 155 03/23/2015 0904   CHOL 131  05/20/2012 0940   TRIG 191 (H) 03/23/2015 0904   TRIG 154 05/20/2012 0940   HDL 47 03/23/2015 0904   HDL 44 05/20/2012 0940   CHOLHDL 3.3 03/23/2015 0904   VLDL 31 05/20/2012 0940   LDLCALC 70 03/23/2015 0904   LDLCALC 56 05/20/2012 0940    He reports excellent compliance with treatment. He is not having side effects.  Wt Readings from Last 3 Encounters:  09/21/15 170 lb (77.1 kg)  03/29/15 174 lb (78.9 kg)  03/23/15 173 lb (78.5 kg)    ------------------------------------------------------------------------   Follow up for CKD  The patient was last seen for this 6 months ago. Changes made at last visit include labs checked, no changes.  He reports excellent compliance with treatment. He feels that condition is Unchanged. He is not having side effects.   ------------------------------------------------------------------------------------       Allergies  Allergen Reactions  . Hydrocodone-Guaifenesin Shortness Of Breath and Other (See Comments)    Not able to sit still when using Codiclear DH  . Bee Venom Other (See Comments)  . Codeine     Other reaction(s): Pruritus  . Morphine Sulfate     Other reaction(s): Pruritus  . Warfarin Sodium Diarrhea   Current Meds  Medication Sig  . aspirin 81 MG tablet Take 1 tablet by mouth daily.  . clopidogrel (PLAVIX) 75 MG tablet TAKE 1 TABLET BY MOUTH DAILY  . EPINEPHrine 0.3 mg/0.3 mL IJ SOAJ injection 1 injection as directed  . LORazepam (ATIVAN) 1 MG tablet TAKE 1/2 TO 1 TABLET BY MOUTH AT BEDTIME  . losartan-hydrochlorothiazide (HYZAAR) 50-12.5 MG  tablet TAKE 1 TABLET BY MOUTH ONCE DAILY  . metFORMIN (GLUCOPHAGE) 850 MG tablet TAKE 1 TABLET BY MOUTH TWICE DAILY USE NDC [id number]  . metoprolol tartrate (LOPRESSOR) 25 MG tablet TAKE 1 TABLET BY MOUTH TWICE DAILY  . simvastatin (ZOCOR) 20 MG tablet Take 1 tablet (20 mg total) by mouth daily.  . TRUE METRIX BLOOD GLUCOSE TEST test strip USE DAILY    Review of  Systems  Constitutional: Negative.  Negative for appetite change, chills and fever.  Respiratory: Negative for chest tightness, shortness of breath and wheezing.   Cardiovascular: Negative.  Negative for chest pain and palpitations.  Gastrointestinal: Negative for abdominal pain, nausea and vomiting.  Endocrine: Negative.     Social History  Substance Use Topics  . Smoking status: Former Smoker    Packs/day: 0.51    Types: Cigarettes    Quit date: 08/23/2013  . Smokeless tobacco: Never Used  . Alcohol use No   Objective:   BP 126/80 (BP Location: Left Arm, Patient Position: Sitting, Cuff Size: Large)   Pulse 88   Temp 97.7 F (36.5 C) (Oral)   Resp 16   Ht 5\' 7"  (1.702 m)   Wt 170 lb (77.1 kg)   BMI 26.63 kg/m   Physical Exam   General Appearance:    Alert, cooperative, no distress  Eyes:    PERRL, conjunctiva/corneas clear, EOM's intact       Lungs:     Clear to auscultation bilaterally, respirations unlabored  Heart:    Regular rate and rhythm  Neurologic:   Awake, alert, oriented x 3. No apparent focal neurological           defect.       Results for orders placed or performed in visit on 09/21/15  POCT glycosylated hemoglobin (Hb A1C)  Result Value Ref Range   Hemoglobin A1C 7.4    Est. average glucose Bld gHb Est-mCnc 166         Assessment & Plan:     1. Controlled type 2 diabetes mellitus with diabetic nephropathy, without long-term current use of insulin (HCC) Controlled, but A1c is increasing. Admits to eating more sweets lately which he is going to cut out. Continue current medications.   - POCT glycosylated hemoglobin (Hb A1C)  2. Essential hypertension Well controlled.  Continue current medications.    Follow up 6 months.         Lelon Huh, MD  [facility]

## 2015-09-27 ENCOUNTER — Other Ambulatory Visit: Payer: Self-pay | Admitting: Pharmacist

## 2015-09-27 NOTE — Patient Outreach (Signed)
[facility]) Care Management  [place]   09/27/2015  Bobby Thomas 1940-07-07 [id number]  Subjective: Bobby Thomas is a 75 year old male here today with his wife for his Link To Wellness visit. He is very positive about his health and voices no concerns at this visit. He is current with his dental and vision exams. He was last seen on 09/21/15 by his primary care provider, Dr. Caryn Section. His A1c was slightly elevated at 7.4%. Patient's wife explained that this happens every summer. He continues to exercise 5-7 days per week for 60 minutes per session.   Objective:   Vitals:   09/27/15 0909  BP: (!) 142/80  Weight: 171 lb (77.6 kg)  Height: 1.702 m (5\' 7" )    Labs: A1c = 7.4% 09/21/15 Lipid Panel 03/23/15: TC = 155 mg/dl; TG = 191 mg/dl; HDL = 47 mg/dl; LDL = 70 mg/dl Renal Panel: BUN = 36 mg/dl; SCr = 1.39 mg/dl; GFR = 50 ml/min/1.73   Encounter Medications: Outpatient Encounter Prescriptions as of 09/27/2015  Medication Sig Note  . aspirin 81 MG tablet Take 1 tablet by mouth daily. 06/01/2014: Received from: [place]:   . clopidogrel (PLAVIX) 75 MG tablet TAKE 1 TABLET BY MOUTH DAILY   . EPINEPHrine 0.3 mg/0.3 mL IJ SOAJ injection 1 injection as directed   . LORazepam (ATIVAN) 1 MG tablet TAKE 1/2 TO 1 TABLET BY MOUTH AT BEDTIME   . losartan-hydrochlorothiazide (HYZAAR) 50-12.5 MG tablet TAKE 1 TABLET BY MOUTH ONCE DAILY   . metFORMIN (GLUCOPHAGE) 850 MG tablet Take 850 mg by mouth 2 (two) times daily with a meal. Use NDC [id number] per previous med entry   . metoprolol tartrate (LOPRESSOR) 25 MG tablet TAKE 1 TABLET BY MOUTH TWICE DAILY   . simvastatin (ZOCOR) 20 MG tablet Take 1 tablet (20 mg total) by mouth daily.   . TRUE METRIX BLOOD GLUCOSE TEST test strip USE DAILY   . [DISCONTINUED] metFORMIN (GLUCOPHAGE) 850 MG tablet TAKE 1 TABLET BY MOUTH TWICE DAILY USE NDC [id number]  (Patient taking differently: Take 1  tablet by mouth twice daily  USE NDC [id number])    No facility-administered encounter medications on file as of 09/27/2015.     Functional Status: In your present state of health, do you have any difficulty performing the following activities: 09/27/2015 03/29/2015  Hearing? N -  Vision? N -  Difficulty concentrating or making decisions? N Y  Walking or climbing stairs? N -  Dressing or bathing? N -  Doing errands, shopping? N -  Some recent data might be hidden    Fall/Depression Screening: PHQ 2/9 Scores 09/27/2015 03/29/2015  PHQ - 2 Score 0 0     THN CM Care Plan Problem One   Flowsheet Row Most Recent Value  Care Plan Problem One  At risk of elevated A1c  Role Documenting the Problem One  Clinical Pharmacist  Care Plan for Problem One  Active  THN Long Term Goal (31-90 days)  Over the next 90 days, patient will decrease A1c to 7%,  evidenced by patient report or lab value  THN Long Term Goal Start Date  09/27/15  Interventions for Problem One Long Term Goal  Patient encouraged to continue exercising 5-7 days per week, 30 minutes per session. Encouraged to take all medications as directed. Paitent will decrease intake of sweets.     Assessment: 1. Diabetes: A1c not at goal of less than 7%. Patient states he  was eating sweets every day and has cut this out of his morning routine. His morning blood glucose ranges have been in the 130-140 range. 2. Cholesterol: TC within goal of less than 200 mg/dl; HDL within goal of greater than 40 mg/dl; LDL within goal of less than 100 mg/dl; TG not within goal of less than 150 mg/dl. 3. Hypertension: Blood Pressure not within goal of less than 140/90 mmHG. Patient states this typically runs lower. 4. Renal Function: Stage III chronic kidney disease per chart. Lab values indicate slight improvement from October 2016 values.   Plan: 1. Patient has an appointment scheduled with Dr. Caryn Section on 03/22/16 for Diabetes management. 2. Patient will  follow up with the pharmacist in 6 months for the Link To Wellness visit. 3. Encouraged patient to continue going to all appointments and continue exercising 5 days per week.  [name] K. [name], PharmD [facility] [phone]

## 2015-10-28 ENCOUNTER — Ambulatory Visit (INDEPENDENT_AMBULATORY_CARE_PROVIDER_SITE_OTHER): Payer: Medicare Other

## 2015-10-28 DIAGNOSIS — Z23 Encounter for immunization: Secondary | ICD-10-CM | POA: Diagnosis not present

## 2015-11-13 ENCOUNTER — Other Ambulatory Visit: Payer: Self-pay | Admitting: Family Medicine

## 2015-11-20 ENCOUNTER — Telehealth: Payer: Self-pay | Admitting: Family Medicine

## 2015-11-20 ENCOUNTER — Other Ambulatory Visit: Payer: Self-pay | Admitting: Family Medicine

## 2015-11-20 NOTE — Telephone Encounter (Signed)
Patient's wife stated that they will discuss referral and call back with their decision.

## 2015-11-20 NOTE — Telephone Encounter (Signed)
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 If don't know if this is something to be concerned about or nor. Recommend appointment with ENT to examine his jaw and maybe get some more Xrays. If patient is agreeable then please enter order for sarah to do referral. Patient needs to pick up Xrays and take them to ENT when he sees them. Thanks.

## 2015-11-21 NOTE — Telephone Encounter (Signed)
Patient's wife stated that pt wants to discuss the x-ray results with Dr. Caryn Section at his next ov appt.

## 2015-11-21 NOTE — Telephone Encounter (Signed)
Pt's wife called saying patient is not sure if he wants to see an ENT.  Please call back at [phone]  Thank sTeri

## 2015-11-27 ENCOUNTER — Other Ambulatory Visit: Payer: Self-pay | Admitting: Family Medicine

## 2015-11-27 DIAGNOSIS — D692 Other nonthrombocytopenic purpura: Secondary | ICD-10-CM | POA: Diagnosis not present

## 2015-11-27 DIAGNOSIS — D229 Melanocytic nevi, unspecified: Secondary | ICD-10-CM | POA: Diagnosis not present

## 2015-11-27 DIAGNOSIS — L812 Freckles: Secondary | ICD-10-CM | POA: Diagnosis not present

## 2015-11-27 DIAGNOSIS — Z1283 Encounter for screening for malignant neoplasm of skin: Secondary | ICD-10-CM | POA: Diagnosis not present

## 2015-11-27 DIAGNOSIS — Z85828 Personal history of other malignant neoplasm of skin: Secondary | ICD-10-CM | POA: Diagnosis not present

## 2015-11-27 DIAGNOSIS — L820 Inflamed seborrheic keratosis: Secondary | ICD-10-CM | POA: Diagnosis not present

## 2015-11-27 DIAGNOSIS — L570 Actinic keratosis: Secondary | ICD-10-CM | POA: Diagnosis not present

## 2015-11-27 DIAGNOSIS — L821 Other seborrheic keratosis: Secondary | ICD-10-CM | POA: Diagnosis not present

## 2015-11-27 DIAGNOSIS — L578 Other skin changes due to chronic exposure to nonionizing radiation: Secondary | ICD-10-CM | POA: Diagnosis not present

## 2015-11-27 NOTE — Telephone Encounter (Signed)
Please call in lorazepam.  

## 2015-11-28 ENCOUNTER — Other Ambulatory Visit: Payer: Self-pay | Admitting: Family Medicine

## 2015-11-28 NOTE — Telephone Encounter (Signed)
Please call in lorazepam.  

## 2015-11-28 NOTE — Telephone Encounter (Signed)
Rx called in to pharmacy. 

## 2015-12-09 ENCOUNTER — Emergency Department: Payer: 59

## 2015-12-09 ENCOUNTER — Inpatient Hospital Stay
Admission: EM | Admit: 2015-12-09 | Discharge: 2015-12-11 | DRG: 390 | Disposition: A | Discharge status: Home / Self Care | Payer: 59 | Attending: General Surgery | Admitting: General Surgery

## 2015-12-09 DIAGNOSIS — Z91030 Bee allergy status: Secondary | ICD-10-CM | POA: Diagnosis not present

## 2015-12-09 DIAGNOSIS — Z7982 Long term (current) use of aspirin: Secondary | ICD-10-CM | POA: Diagnosis not present

## 2015-12-09 DIAGNOSIS — F329 Major depressive disorder, single episode, unspecified: Secondary | ICD-10-CM | POA: Diagnosis present

## 2015-12-09 DIAGNOSIS — Z888 Allergy status to other drugs, medicaments and biological substances status: Secondary | ICD-10-CM

## 2015-12-09 DIAGNOSIS — Z87891 Personal history of nicotine dependence: Secondary | ICD-10-CM | POA: Diagnosis not present

## 2015-12-09 DIAGNOSIS — K56609 Unspecified intestinal obstruction, unspecified as to partial versus complete obstruction: Secondary | ICD-10-CM | POA: Diagnosis not present

## 2015-12-09 DIAGNOSIS — Z7984 Long term (current) use of oral hypoglycemic drugs: Secondary | ICD-10-CM | POA: Diagnosis not present

## 2015-12-09 DIAGNOSIS — F419 Anxiety disorder, unspecified: Secondary | ICD-10-CM | POA: Diagnosis present

## 2015-12-09 DIAGNOSIS — K565 Intestinal adhesions [bands], unspecified as to partial versus complete obstruction: Secondary | ICD-10-CM | POA: Diagnosis present

## 2015-12-09 DIAGNOSIS — N183 Chronic kidney disease, stage 3 (moderate): Secondary | ICD-10-CM | POA: Diagnosis present

## 2015-12-09 DIAGNOSIS — Z885 Allergy status to narcotic agent status: Secondary | ICD-10-CM

## 2015-12-09 DIAGNOSIS — Z7902 Long term (current) use of antithrombotics/antiplatelets: Secondary | ICD-10-CM

## 2015-12-09 DIAGNOSIS — Z79899 Other long term (current) drug therapy: Secondary | ICD-10-CM | POA: Diagnosis not present

## 2015-12-09 DIAGNOSIS — I69920 Aphasia following unspecified cerebrovascular disease: Secondary | ICD-10-CM

## 2015-12-09 DIAGNOSIS — Z955 Presence of coronary angioplasty implant and graft: Secondary | ICD-10-CM

## 2015-12-09 DIAGNOSIS — E785 Hyperlipidemia, unspecified: Secondary | ICD-10-CM | POA: Diagnosis present

## 2015-12-09 DIAGNOSIS — E1122 Type 2 diabetes mellitus with diabetic chronic kidney disease: Secondary | ICD-10-CM | POA: Diagnosis present

## 2015-12-09 DIAGNOSIS — I4891 Unspecified atrial fibrillation: Secondary | ICD-10-CM | POA: Diagnosis present

## 2015-12-09 DIAGNOSIS — K5669 Other partial intestinal obstruction: Secondary | ICD-10-CM | POA: Diagnosis not present

## 2015-12-09 LAB — CBC
HEMATOCRIT: 44.7 % (ref 40.0–52.0)
Hemoglobin: 15 g/dL (ref 13.0–18.0)
MCH: 30.8 pg (ref 26.0–34.0)
MCHC: 33.6 g/dL (ref 32.0–36.0)
MCV: 91.5 fL (ref 80.0–100.0)
Platelets: 251 10*3/uL (ref 150–440)
RBC: 4.89 MIL/uL (ref 4.40–5.90)
RDW: 14.9 % — AB (ref 11.5–14.5)
WBC: 16 10*3/uL — ABNORMAL HIGH (ref 3.8–10.6)

## 2015-12-09 LAB — URINALYSIS COMPLETE WITH MICROSCOPIC (ARMC ONLY)
BACTERIA UA: NONE SEEN
BILIRUBIN URINE: NEGATIVE
GLUCOSE, UA: NEGATIVE mg/dL
Hgb urine dipstick: NEGATIVE
Ketones, ur: NEGATIVE mg/dL
Leukocytes, UA: NEGATIVE
NITRITE: NEGATIVE
Protein, ur: 30 mg/dL — AB
Specific Gravity, Urine: 1.035 — ABNORMAL HIGH (ref 1.005–1.030)
WBC UA: NONE SEEN WBC/hpf (ref 0–5)
pH: 5 (ref 5.0–8.0)

## 2015-12-09 LAB — COMPREHENSIVE METABOLIC PANEL
ALBUMIN: 4.7 g/dL (ref 3.5–5.0)
ALT: 17 U/L (ref 17–63)
AST: 26 U/L (ref 15–41)
Alkaline Phosphatase: 43 U/L (ref 38–126)
Anion gap: 13 (ref 5–15)
BILIRUBIN TOTAL: 1.4 mg/dL — AB (ref 0.3–1.2)
BUN: 43 mg/dL — AB (ref 6–20)
CO2: 28 mmol/L (ref 22–32)
Calcium: 10.2 mg/dL (ref 8.9–10.3)
Chloride: 96 mmol/L — ABNORMAL LOW (ref 101–111)
Creatinine, Ser: 1.61 mg/dL — ABNORMAL HIGH (ref 0.61–1.24)
GFR calc Af Amer: 47 mL/min — ABNORMAL LOW (ref >60)
GFR calc non Af Amer: 41 mL/min — ABNORMAL LOW (ref >60)
GLUCOSE: 210 mg/dL — AB (ref 65–99)
POTASSIUM: 4.4 mmol/L (ref 3.5–5.1)
SODIUM: 137 mmol/L (ref 135–145)
TOTAL PROTEIN: 8.6 g/dL — AB (ref 6.5–8.1)

## 2015-12-09 LAB — GLUCOSE, CAPILLARY: Glucose-Capillary: 178 mg/dL — ABNORMAL HIGH (ref 65–99)

## 2015-12-09 LAB — LIPASE, BLOOD: Lipase: 25 U/L (ref 11–51)

## 2015-12-09 LAB — TROPONIN I

## 2015-12-09 MED ORDER — DEXTROSE IN LACTATED RINGERS 5 % IV SOLN
INTRAVENOUS | Status: DC
  Administered 2015-12-09 – 2015-12-10 (×2): via INTRAVENOUS

## 2015-12-09 MED ORDER — ONDANSETRON HCL 4 MG/2ML IJ SOLN
4.0000 mg | Freq: Four times a day (QID) | INTRAMUSCULAR | Status: DC | PRN
  Administered 2015-12-09: 4 mg via INTRAVENOUS
  Filled 2015-12-09: qty 2

## 2015-12-09 MED ORDER — ONDANSETRON HCL 4 MG/2ML IJ SOLN: 4.0000 mg | Freq: Once | INTRAMUSCULAR | Status: DC

## 2015-12-09 MED ORDER — DIPHENHYDRAMINE HCL 12.5 MG/5ML PO ELIX: 12.5000 mg | ORAL_SOLUTION | Freq: Four times a day (QID) | ORAL | Status: DC | PRN

## 2015-12-09 MED ORDER — SODIUM CHLORIDE 0.9 % IV BOLUS (SEPSIS)
1000.0000 mL | Freq: Once | INTRAVENOUS | Status: AC
  Administered 2015-12-09: 1000 mL via INTRAVENOUS

## 2015-12-09 MED ORDER — DIPHENHYDRAMINE HCL 50 MG/ML IJ SOLN
12.5000 mg | Freq: Four times a day (QID) | INTRAMUSCULAR | Status: DC | PRN
  Administered 2015-12-10: 12.5 mg via INTRAVENOUS
  Filled 2015-12-09: qty 1

## 2015-12-09 MED ORDER — IOPAMIDOL (ISOVUE-300) INJECTION 61%
100.0000 mL | Freq: Once | INTRAVENOUS | Status: AC | PRN
  Administered 2015-12-09: 80 mL via INTRAVENOUS

## 2015-12-09 MED ORDER — ONDANSETRON 4 MG PO TBDP: 4.0000 mg | ORAL_TABLET | Freq: Four times a day (QID) | ORAL | Status: DC | PRN

## 2015-12-09 MED ORDER — IOPAMIDOL (ISOVUE-300) INJECTION 61%
30.0000 mL | Freq: Once | INTRAVENOUS | Status: AC | PRN
  Administered 2015-12-09: 30 mL via ORAL

## 2015-12-09 MED ORDER — HYDRALAZINE HCL 20 MG/ML IJ SOLN
10.0000 mg | INTRAMUSCULAR | Status: DC | PRN
  Administered 2015-12-11: 10 mg via INTRAVENOUS
  Filled 2015-12-09: qty 1

## 2015-12-09 MED ORDER — FAMOTIDINE IN NACL 20-0.9 MG/50ML-% IV SOLN
20.0000 mg | Freq: Two times a day (BID) | INTRAVENOUS | Status: DC
  Administered 2015-12-09 – 2015-12-10 (×3): 20 mg via INTRAVENOUS
  Filled 2015-12-09 (×5): qty 50

## 2015-12-09 MED ORDER — INSULIN ASPART 100 UNIT/ML ~~LOC~~ SOLN
0.0000 [IU] | SUBCUTANEOUS | Status: DC
Start: 2015-12-09 — End: 2015-12-11
  Administered 2015-12-09 – 2015-12-10 (×2): 3 [IU] via SUBCUTANEOUS
  Administered 2015-12-10: 2 [IU] via SUBCUTANEOUS
  Administered 2015-12-10: 3 [IU] via SUBCUTANEOUS
  Administered 2015-12-10 – 2015-12-11 (×4): 2 [IU] via SUBCUTANEOUS
  Filled 2015-12-09: qty 3
  Filled 2015-12-09: qty 2
  Filled 2015-12-09: qty 3
  Filled 2015-12-09 (×3): qty 2
  Filled 2015-12-09: qty 3
  Filled 2015-12-09: qty 2

## 2015-12-09 MED ORDER — ENOXAPARIN SODIUM 40 MG/0.4ML ~~LOC~~ SOLN
30.0000 mg | SUBCUTANEOUS | Status: DC
Start: 2015-12-09 — End: 2015-12-11
  Administered 2015-12-09 – 2015-12-10 (×2): 30 mg via SUBCUTANEOUS
  Filled 2015-12-09 (×2): qty 0.4
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
## 2015-12-09 NOTE — ED Triage Notes (Signed)
Pt states for the past 2 days he has been vomiting everything he put on his stomach, pt reports a blockage approx 5 years ago, pt states that he feels the same and thinks it may be related to the green beans he didn't chew well a few days ago. Pt passed clear liquid type stool earlier that was concerning to them
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
## 2015-12-09 NOTE — ED Notes (Addendum)
Pt states constipated x 2 days. States now when he eats he vomits. Took a laxative his wife had left over from surgery (she doesn't remember the name) with no relief. Had a clear liguid bm earlier today

## 2015-12-10 ENCOUNTER — Inpatient Hospital Stay: Payer: 59

## 2015-12-10 LAB — GLUCOSE, CAPILLARY
GLUCOSE-CAPILLARY: 179 mg/dL — AB (ref 65–99)
GLUCOSE-CAPILLARY: 146 mg/dL — AB (ref 65–99)
Glucose-Capillary: 155 mg/dL — ABNORMAL HIGH (ref 65–99)
Glucose-Capillary: 83 mg/dL (ref 65–99)
Glucose-Capillary: 131 mg/dL — ABNORMAL HIGH (ref 65–99)

## 2015-12-10 LAB — CBC
HCT: 41.6 % (ref 40.0–52.0)
Hemoglobin: 14.4 g/dL (ref 13.0–18.0)
MCH: 31.2 pg (ref 26.0–34.0)
MCHC: 34.5 g/dL (ref 32.0–36.0)
MCV: 90.6 fL (ref 80.0–100.0)
PLATELETS: 236 10*3/uL (ref 150–440)
RBC: 4.6 MIL/uL (ref 4.40–5.90)
RDW: 14.8 % — AB (ref 11.5–14.5)
WBC: 9.1 10*3/uL (ref 3.8–10.6)

## 2015-12-10 LAB — BASIC METABOLIC PANEL
Anion gap: 8 (ref 5–15)
BUN: 36 mg/dL — AB (ref 6–20)
CALCIUM: 9.3 mg/dL (ref 8.9–10.3)
CO2: 31 mmol/L (ref 22–32)
CREATININE: 1.57 mg/dL — AB (ref 0.61–1.24)
Chloride: 97 mmol/L — ABNORMAL LOW (ref 101–111)
GFR, EST AFRICAN AMERICAN: 49 mL/min — AB (ref >60)
GFR, EST NON AFRICAN AMERICAN: 42 mL/min — AB (ref >60)
Glucose, Bld: 185 mg/dL — ABNORMAL HIGH (ref 65–99)
Potassium: 4.1 mmol/L (ref 3.5–5.1)
SODIUM: 136 mmol/L (ref 135–145)

## 2015-12-10 LAB — MAGNESIUM: MAGNESIUM: 1.8 mg/dL (ref 1.7–2.4)

## 2015-12-10 LAB — PHOSPHORUS: PHOSPHORUS: 4 mg/dL (ref 2.5–4.6)

## 2015-12-10 MED ORDER — KCL IN DEXTROSE-NACL 20-5-0.2 MEQ/L-%-% IV SOLN
INTRAVENOUS | Status: DC
Start: 2015-12-10 — End: 2015-12-11
  Administered 2015-12-10 – 2015-12-11 (×3): via INTRAVENOUS
  Filled 2015-12-10 (×5): qty 1000

## 2015-12-10 MED ORDER — DIPHENHYDRAMINE HCL 50 MG/ML IJ SOLN
12.5000 mg | Freq: Four times a day (QID) | INTRAMUSCULAR | Status: DC | PRN
  Administered 2015-12-10: 12.5 mg via INTRAVENOUS
  Filled 2015-12-10: qty 1

## 2015-12-10 MED ORDER — DIPHENHYDRAMINE HCL 12.5 MG/5ML PO ELIX: 12.5000 mg | ORAL_SOLUTION | Freq: Four times a day (QID) | ORAL | Status: DC | PRN
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
## 2015-12-11 ENCOUNTER — Inpatient Hospital Stay: Payer: 59

## 2015-12-11 LAB — GLUCOSE, CAPILLARY
GLUCOSE-CAPILLARY: 107 mg/dL — AB (ref 65–99)
GLUCOSE-CAPILLARY: 131 mg/dL — AB (ref 65–99)
Glucose-Capillary: 134 mg/dL — ABNORMAL HIGH (ref 65–99)
Glucose-Capillary: 111 mg/dL — ABNORMAL HIGH (ref 65–99)
Glucose-Capillary: 127 mg/dL — ABNORMAL HIGH (ref 65–99)

## 2015-12-11 LAB — BASIC METABOLIC PANEL
Anion gap: 8 (ref 5–15)
BUN: 21 mg/dL — AB (ref 6–20)
CHLORIDE: 100 mmol/L — AB (ref 101–111)
CO2: 32 mmol/L (ref 22–32)
CREATININE: 1.31 mg/dL — AB (ref 0.61–1.24)
Calcium: 9 mg/dL (ref 8.9–10.3)
GFR calc Af Amer: >60 mL/min (ref >60)
GFR calc non Af Amer: 52 mL/min — ABNORMAL LOW (ref >60)
GLUCOSE: 161 mg/dL — AB (ref 65–99)
Potassium: 4.2 mmol/L (ref 3.5–5.1)
SODIUM: 140 mmol/L (ref 135–145)

## 2015-12-11 LAB — CBC
HCT: 40.2 % (ref 40.0–52.0)
HEMOGLOBIN: 13.9 g/dL (ref 13.0–18.0)
MCH: 31.7 pg (ref 26.0–34.0)
MCHC: 34.7 g/dL (ref 32.0–36.0)
MCV: 91.2 fL (ref 80.0–100.0)
PLATELETS: 231 10*3/uL (ref 150–440)
RBC: 4.4 MIL/uL (ref 4.40–5.90)
RDW: 15 % — ABNORMAL HIGH (ref 11.5–14.5)
WBC: 9.5 10*3/uL (ref 3.8–10.6)

## 2015-12-11 NOTE — Progress Notes (Signed)
12/11/2015 5:50 PM  Bobby Thomas to be D/C'd Home per MD order.  Discussed prescriptions and follow up appointments with the patient. Prescriptions given to patient, medication list explained in detail. Pt verbalized understanding.    Medication List    TAKE these medications   aspirin 81 MG tablet Take 1 tablet by mouth daily.   clopidogrel 75 MG tablet Commonly known as:  PLAVIX TAKE 1 TABLET BY MOUTH DAILY   EPINEPHrine 0.3 mg/0.3 mL Soaj injection Commonly known as:  EPI-PEN 1 injection as directed   LORazepam 1 MG tablet Commonly known as:  ATIVAN TAKE 1/2 TO 1 TABLET BY MOUTH NIGHTLY AT BEDTIME   losartan-hydrochlorothiazide 50-12.5 MG tablet Commonly known as:  HYZAAR TAKE 1 TABLET BY MOUTH ONCE DAILY   metFORMIN 850 MG tablet Commonly known as:  GLUCOPHAGE Take 850 mg by mouth 2 (two) times daily with a meal. Use NDC 82956-213-08 per previous med entry   metoprolol tartrate 25 MG tablet Commonly known as:  LOPRESSOR TAKE 1 TABLET BY MOUTH TWICE DAILY   simvastatin 20 MG tablet Commonly known as:  ZOCOR Take 1 tablet (20 mg total) by mouth daily.   TRUE METRIX BLOOD GLUCOSE TEST test strip Generic drug:  glucose blood USE DAILY       Vitals:   12/11/15 1446 12/11/15 1631  BP: (!) 185/90 136/76  Pulse: 83 95  Resp:    Temp:      Skin clean, dry and intact without evidence of skin break down, no evidence of skin tears noted. IV catheter discontinued intact. Site without signs and symptoms of complications. Dressing and pressure applied. Pt denies pain at this time. No complaints noted.  An After Visit Summary was printed and given to the patient. Patient escorted via [place], and D/C home via private auto.  [name]

## 2015-12-14 NOTE — Discharge Summary (Signed)
Physician Discharge Summary  Patient ID: Bobby Thomas MRN: [id number] DOB/AGE: 73-Apr-1942 73 y.o.  Admit date: 12/09/2015 Discharge date: 12/14/2015  Admission Diagnoses:SBO  Discharge Diagnoses:  Active Problems:   SBO (small bowel obstruction)   Discharged Condition: good  Hospital Course: 75 yr old male with SBO.  He had ng tube removed and diet slowly advanced.  He was up and moving around and having BMs.   Consults: None  Significant Diagnostic Studies: CT  Treatments: IV hydration  Discharge Exam: Blood pressure 136/76, pulse 95, temperature 98.1 F (36.7 C), temperature source Oral, resp. rate 16, height 5\' 7"  (1.702 m), weight 168 lb 12.8 oz (76.6 kg), SpO2 99 %. General appearance: alert, cooperative and no distress GI: soft, non-tender; bowel sounds normal; no masses,  no organomegaly  Disposition: 01-Home or Self Care  Discharge Instructions    Activity as tolerated - No restrictions    Complete by:  As directed    Call MD for:  difficulty breathing, headache or visual disturbances    Complete by:  As directed    Call MD for:  persistant nausea and vomiting    Complete by:  As directed    Call MD for:  redness, tenderness, or signs of infection (pain, swelling, redness, odor or green/yellow discharge around incision site)    Complete by:  As directed    Call MD for:  severe uncontrolled pain    Complete by:  As directed    Call MD for:  temperature >100.4    Complete by:  As directed    Diet - low sodium heart healthy    Complete by:  As directed    Increase activity slowly    Complete by:  As directed    No wound care    Complete by:  As directed        Medication List    TAKE these medications   aspirin 81 MG tablet Take 1 tablet by mouth daily.   clopidogrel 75 MG tablet Commonly known as:  PLAVIX TAKE 1 TABLET BY MOUTH DAILY   EPINEPHrine 0.3 mg/0.3 mL Soaj injection Commonly known as:  EPI-PEN 1 injection as directed   LORazepam 1  MG tablet Commonly known as:  ATIVAN TAKE 1/2 TO 1 TABLET BY MOUTH NIGHTLY AT BEDTIME   losartan-hydrochlorothiazide 50-12.5 MG tablet Commonly known as:  HYZAAR TAKE 1 TABLET BY MOUTH ONCE DAILY   metFORMIN 850 MG tablet Commonly known as:  GLUCOPHAGE Take 850 mg by mouth 2 (two) times daily with a meal. Use NDC 34193-790-24 per previous med entry   metoprolol tartrate 25 MG tablet Commonly known as:  LOPRESSOR TAKE 1 TABLET BY MOUTH TWICE DAILY   simvastatin 20 MG tablet Commonly known as:  ZOCOR Take 1 tablet (20 mg total) by mouth daily.   TRUE METRIX BLOOD GLUCOSE TEST test strip Generic drug:  glucose blood USE DAILY      Follow-up Information    Lelon Huh, MD Follow up.   Specialty:  Family Medicine Why:  Keep appointment on 11/20 please Contact information: [address] [place] [address] [phone]        [facility] Follow up.   Why:  please call with any questions or concerns          Signed: Hubbard Robinson 12/14/2015, 7:56 AM

## 2015-12-19 ENCOUNTER — Other Ambulatory Visit: Payer: Self-pay | Admitting: Family Medicine

## 2015-12-20 ENCOUNTER — Ambulatory Visit: Payer: Self-pay | Admitting: Pharmacist

## 2015-12-25 ENCOUNTER — Encounter: Payer: Self-pay | Admitting: Family Medicine

## 2015-12-25 ENCOUNTER — Ambulatory Visit (INDEPENDENT_AMBULATORY_CARE_PROVIDER_SITE_OTHER): Payer: 59 | Admitting: Family Medicine

## 2015-12-25 ENCOUNTER — Other Ambulatory Visit: Payer: Self-pay | Admitting: Family Medicine

## 2015-12-25 VITALS — BP 138/78 | HR 80 | Temp 97.6°F | Resp 16 | Wt 168.0 lb

## 2015-12-25 DIAGNOSIS — N183 Chronic kidney disease, stage 3 unspecified: Secondary | ICD-10-CM

## 2015-12-25 DIAGNOSIS — E1121 Type 2 diabetes mellitus with diabetic nephropathy: Secondary | ICD-10-CM | POA: Diagnosis not present

## 2015-12-25 DIAGNOSIS — I779 Disorder of arteries and arterioles, unspecified: Secondary | ICD-10-CM | POA: Diagnosis not present

## 2015-12-25 DIAGNOSIS — I739 Peripheral vascular disease, unspecified: Secondary | ICD-10-CM

## 2015-12-25 LAB — POCT GLYCOSYLATED HEMOGLOBIN (HGB A1C)
Est. average glucose Bld gHb Est-mCnc: 148
Hemoglobin A1C: 6.8

## 2015-12-25 MED ORDER — SIMVASTATIN 20 MG PO TABS: 20.0000 mg | ORAL_TABLET | Freq: Every day | ORAL | 4 refills | Status: DC

## 2015-12-25 NOTE — Progress Notes (Signed)
[name] Male    DOB: 08/04/1940   75 y.o.   MRN: [id number] Visit Date: 12/25/2015  Today's Provider: Lelon Huh, MD   Chief Complaint  Patient presents with  . Diabetes    follow up  . Hypertension    follow up   Subjective:    HPI  Diabetes Mellitus Type II, Follow-up:   Lab Results  Component Value Date   HGBA1C 7.4 09/21/2015   HGBA1C 6.8 03/23/2015   HGBA1C 6.9 11/24/2014    Last seen for diabetes 3 months ago.  Management since then includes counseling patient to continue to work on diet and exercise. He reports good compliance with treatment. He is not having side effects.  Current symptoms include none and have been stable. Home blood sugar records: fasting range: 130's  Episodes of hypoglycemia? no   Current Insulin Regimen: none Most Recent Eye Exam: 6 months ago Weight trend: stable Prior visit with dietician: no Current diet: healthy diet Current exercise: walking  Pertinent Labs:    Component Value Date/Time   CHOL 155 03/23/2015 0904   CHOL 131 05/20/2012 0940   TRIG 191 (H) 03/23/2015 0904   TRIG 154 05/20/2012 0940   HDL 47 03/23/2015 0904   HDL 44 05/20/2012 0940   LDLCALC 70 03/23/2015 0904   LDLCALC 56 05/20/2012 0940   CREATININE 1.31 (H) 12/11/2015 0528   CREATININE 1.24 05/20/2012 0940    Wt Readings from Last 3 Encounters:  12/09/15 168 lb 12.8 oz (76.6 kg)  09/27/15 171 lb (77.6 kg)  09/21/15 170 lb (77.1 kg)    ------------------------------------------------------------------------  Hypertension, follow-up:  BP Readings from Last 3 Encounters:  12/11/15 136/76  09/27/15 (!) 142/80  09/21/15 126/80    He was last seen for hypertension 3 months ago.  BP at that visit was 126/80. Management since that visit includes no changes. He reports good compliance with treatment. He is not having side effects.  He is exercising. He is adherent to low salt diet.   Outside blood pressures are not  checked. He is experiencing none.  Patient denies none.   Cardiovascular risk factors include advanced age (older than 50 for men, 40 for women), diabetes mellitus, hypertension and male gender.  Use of agents associated with hypertension: NSAIDS.     Weight trend: stable Wt Readings from Last 3 Encounters:  12/09/15 168 lb 12.8 oz (76.6 kg)  09/27/15 171 lb (77.6 kg)  09/21/15 170 lb (77.1 kg)    Current diet: in general, a "healthy" diet    ------------------------------------------------------------------------  Follow up Hospitalization  Patient was admitted to [facility] on 12/09/2015 and discharged on 12/14/2015. He was treated for Small Bowel Obstruction. Had NG tube placed while hospitalized and was followed by Dr. Pat Patrick. No surgical intervention planned at that time.  He reports good compliance with treatment. He reports this condition is Improved. Is now having no abdominal pain, bloating, nausea or constipation.   ------------------------------------------------------------------------------------      Allergies  Allergen Reactions  . Hydrocodone-Guaifenesin Shortness Of Breath and Other (See Comments)    Not able to sit still when using Codiclear DH  . Bee Venom Other (See Comments)  . Codeine     Other reaction(s): Pruritus  . Morphine Sulfate     Other reaction(s): Pruritus  . Warfarin Sodium Diarrhea     Current Outpatient Prescriptions:  .  aspirin 81 MG tablet, Take 1 tablet by mouth daily., Disp: , Rfl:  .  clopidogrel (PLAVIX) 75 MG tablet, TAKE 1 TABLET BY MOUTH DAILY, Disp: 30 tablet, Rfl: 12 .  EPINEPHrine 0.3 mg/0.3 mL IJ SOAJ injection, 1 injection as directed, Disp: , Rfl:  .  LORazepam (ATIVAN) 1 MG tablet, TAKE 1/2 TO 1 TABLET BY MOUTH NIGHTLY AT BEDTIME, Disp: 90 tablet, Rfl: 4 .  losartan-hydrochlorothiazide (HYZAAR) 50-12.5 MG tablet, TAKE 1 TABLET BY MOUTH ONCE DAILY, Disp: 30 tablet, Rfl: 11 .  metFORMIN (GLUCOPHAGE) 850 MG tablet, Take 850  mg by mouth 2 (two) times daily with a meal. Use NDC 84536-468-03 per previous med entry, Disp: , Rfl:  .  metoprolol tartrate (LOPRESSOR) 25 MG tablet, TAKE 1 TABLET BY MOUTH TWICE DAILY, Disp: 60 tablet, Rfl: 11 .  simvastatin (ZOCOR) 20 MG tablet, Take 1 tablet (20 mg total) by mouth daily., Disp: 30 tablet, Rfl: 12 .  TRUE METRIX BLOOD GLUCOSE TEST test strip, USE DAILY, Disp: 150 each, Rfl: 5  Review of Systems  Constitutional: Negative for appetite change, chills and fever.  Respiratory: Negative for chest tightness, shortness of breath and wheezing.   Cardiovascular: Negative for chest pain and palpitations.  Gastrointestinal: Negative for abdominal pain, nausea and vomiting.  Endocrine: Positive for heat intolerance.    Social History  Substance Use Topics  . Smoking status: Former Smoker    Packs/day: 0.51    Types: Cigarettes    Quit date: 08/23/2013  . Smokeless tobacco: Never Used  . Alcohol use No   Objective:   BP 138/78 (BP Location: Right Arm, Patient Position: Sitting, Cuff Size: Normal)   Pulse 80   Temp 97.6 F (36.4 C) (Oral)   Resp 16   Wt 168 lb (76.2 kg)   BMI 26.31 kg/m   Physical Exam   General Appearance:    Alert, cooperative, no distress  Eyes:    PERRL, conjunctiva/corneas clear, EOM's intact       Lungs:     Clear to auscultation bilaterally, respirations unlabored  Heart:    Regular rate and rhythm  Neurologic:   Awake, alert, oriented x 3. No apparent focal neurological           defect.   Neck:    Normal thyroid, no masses or LAD.     Results for orders placed or performed in visit on 12/25/15  POCT HgB A1C  Result Value Ref Range   Hemoglobin A1C 6.8    Est. 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  Continue current medications.   - POCT HgB A1C  2. Chronic kidney disease (CKD), stage III (moderate) Stable.   3. Carotid artery disease,  unspecified laterality ([place]) Asymptomatic. Compliant with medication.  Continue aggressive risk factor modification.  Continue routine follow up and carotid ultrasounds with Dr. Lucky Cowboy.  - simvastatin (ZOCOR) 20 MG tablet; Take 1 tablet (20 mg total) by mouth daily.  Dispense: 90 tablet; Refill: 4     Return in about 6 months (around 06/23/2016).   The entirety of the information documented in the History of Present Illness, Review of Systems and Physical Exam were personally obtained by me. Portions of this information were initially documented by Meyer Cory, CMA and reviewed by me for thoroughness and accuracy.    Lelon Huh, MD  [facility]

## 2015-12-27 ENCOUNTER — Other Ambulatory Visit: Payer: Self-pay | Admitting: Pharmacist

## 2015-12-27 NOTE — Patient Outreach (Signed)
Mr. Lamson was last seen 09/27/15 for his 6 month Link To Wellness visit. Today I am following up with the patient to update the 3 month care plan. A1c = 6.8% on 12-25-15 (Dr. Maralyn Sago office). Mr. Nichol has achieved his A1c goal of less than 7%. Next Link To Wellness visit scheduled for 03/27/16. Mr. Meech also has an appointment scheduled with Dr. Caryn Section on 03/22/16.   [facility] CM Care Plan Problem One   Flowsheet Row Most Recent Value  Care Plan Problem One  At risk of elevated A1c  Role Documenting the Problem One  Clinical Pharmacist  Care Plan for Problem One  Active  [facility] Long Term Goal (31-90 days)  Over the next 90 days, patient will decrease A1c to 7%,  evidenced by patient report or lab value  [facility]N Long Term Goal Start Date  09/27/15  [facility] Long Term Goal Met Date  12/25/15 [A1c goal of less than 7% met on 12-25-15]  Interventions for Problem One Long Term Goal  Patient encouraged to continue exercising 5-7 days per week, 30 minutes per session. Encouraged to take all medications as directed. Paitent will decrease intake of sweets.     Shinichi Anguiano K. [name], PharmD [facility] [phone]

## 2016-02-01 DIAGNOSIS — L820 Inflamed seborrheic keratosis: Secondary | ICD-10-CM | POA: Diagnosis not present

## 2016-02-01 DIAGNOSIS — L578 Other skin changes due to chronic exposure to nonionizing radiation: Secondary | ICD-10-CM | POA: Diagnosis not present

## 2016-02-01 DIAGNOSIS — L570 Actinic keratosis: Secondary | ICD-10-CM | POA: Diagnosis not present

## 2016-03-22 ENCOUNTER — Ambulatory Visit: Payer: Medicare Other | Admitting: Family Medicine

## 2016-03-27 ENCOUNTER — Other Ambulatory Visit: Payer: Self-pay | Admitting: Pharmacist

## 2016-03-27 ENCOUNTER — Encounter: Payer: Self-pay | Admitting: Pharmacist

## 2016-03-27 NOTE — Patient Outreach (Signed)
[facility]) Care Management  [place]   03/27/2016  Bobby Thomas Apr 12, 1940 [id number]  Subjective: Bobby Thomas is a 76 year old male here today with his wife for his Link To Wellness visit. He is very positive about his health and voices no concerns at this visit. He is current with his dental and vision exams. He was last seen in November by his primary care provider, Dr. Caryn Section. He continues to exercise 5-6 days per week for 60 minutes per session. He does not qualify for the [facility] program at this time.  Objective:  Vitals:   03/27/16 0907  BP: 118/70  Weight: 170 lb (77.1 kg)  Height: 1.676 m (5\' 6" )   Labs: A1c = 6.8% 12/25/2015 Renal Panel: Scr = 1.31 mg/dl 12/11/15 BUN = 21 mg/dl GFR = 52 ml/min Lipid Panel 03/23/15: TC = 155 mg/dl; TG = 191 mg/dl; HDL = 47 mg/dl; LDL = 70 mg/dl  Encounter Medications: Outpatient Encounter Prescriptions as of 03/27/2016  Medication Sig  . aspirin 81 MG tablet Take 1 tablet by mouth daily.  . clopidogrel (PLAVIX) 75 MG tablet TAKE 1 TABLET BY MOUTH DAILY  . EPINEPHrine 0.3 mg/0.3 mL IJ SOAJ injection 1 injection as directed  . LORazepam (ATIVAN) 1 MG tablet TAKE 1/2 TO 1 TABLET BY MOUTH NIGHTLY AT BEDTIME  . losartan-hydrochlorothiazide (HYZAAR) 50-12.5 MG tablet TAKE 1 TABLET BY MOUTH ONCE DAILY  . metFORMIN (GLUCOPHAGE) 850 MG tablet Take 850 mg by mouth 2 (two) times daily with a meal. Use NDC 94854-627-03 per previous med entry  . metoprolol tartrate (LOPRESSOR) 25 MG tablet TAKE 1 TABLET BY MOUTH TWICE DAILY  . simvastatin (ZOCOR) 20 MG tablet Take 1 tablet (20 mg total) by mouth daily.  [name] METRIX BLOOD GLUCOSE TEST test strip USE DAILY   No facility-administered encounter medications on file as of 03/27/2016.     Functional Status: In your present state of health, do you have any difficulty performing the following activities: 03/27/2016 12/09/2015  Hearing? N N  Vision? N N  Difficulty  concentrating or making decisions? N N  Walking or climbing stairs? N N  Dressing or bathing? N N  Doing errands, shopping? N Y  Some recent data might be hidden    Fall/Depression Screening: PHQ 2/9 Scores 03/27/2016 09/27/2015 03/29/2015  PHQ - 2 Score 0 0 0     [facility] CM Care Plan Problem One   Flowsheet Row Most Recent Value  Care Plan Problem One  At risk of elevated A1c  Role Documenting the Problem One  Clinical Pharmacist  Care Plan for Problem One  Active  THN Long Term Goal (31-90 days)  Over the next 90 days, patient will maintain A1c < 7%,  evidenced by patient report or lab value  [facility]N Long Term Goal Start Date  03/27/16  Interventions for Problem One Long Term Goal  Patient encouraged to continue exercising 5-6 days per week, 60 minutes per session. Encouraged to take all medications as directed.      Assessment: 1. Diabetes: A1c at goal of less than 7%. His morning blood glucose values have been in the 122-140 mg/dl range. Patient received the influenza vaccine last fall and is current with the pneumococcal vaccine. He does not qualify for the [facility] program as he does not have a smart phone. 2. Cholesterol: TC within goal of less than 200 mg/dl; HDL within goal of greater than 40 mg/dl; LDL within goal of less than  100 mg/dl; TG not within goal of less than 150 mg/dl.  3. Hypertension: Blood Pressure within goal of less than 140/90 mmHg (per ADA guidelines).  4. Renal Function: Stage III chronic kidney disease per chart. 12/2015 lab values indicate slight improvement from October 2016 values. 5. CVA: Hx of CVA 2007. Hx of carotid stenosis, S/p stenting 2008. Per patient's wife, stents are 99% open and doing well. After annual visit in July, patient's doctor will move visits to every other year.  Plan: 1. Patient has an appointment scheduled with Dr. Caryn Section on 06/25/16 for Diabetes management. 2. Patient will follow up with Dr. Lucky Cowboy 08/2016 regarding stents.  3. Patient will  follow up with the pharmacist in 6 months for the Link To Wellness visit. Pharmacist to call patient in 3 months for care plan assessment. 4. Encouraged patient to continue going to all appointments and continue exercising 5 days per week.   [name] K. [name], PharmD [facility] [phone]

## 2016-04-18 ENCOUNTER — Other Ambulatory Visit: Payer: Self-pay | Admitting: Family Medicine

## 2016-04-19 DIAGNOSIS — Z86010 Personal history of colonic polyps: Secondary | ICD-10-CM | POA: Diagnosis not present

## 2016-05-22 DIAGNOSIS — L820 Inflamed seborrheic keratosis: Secondary | ICD-10-CM | POA: Diagnosis not present

## 2016-05-22 DIAGNOSIS — L821 Other seborrheic keratosis: Secondary | ICD-10-CM | POA: Diagnosis not present

## 2016-05-22 DIAGNOSIS — L570 Actinic keratosis: Secondary | ICD-10-CM | POA: Diagnosis not present

## 2016-05-22 DIAGNOSIS — L812 Freckles: Secondary | ICD-10-CM | POA: Diagnosis not present

## 2016-05-22 DIAGNOSIS — L578 Other skin changes due to chronic exposure to nonionizing radiation: Secondary | ICD-10-CM | POA: Diagnosis not present

## 2016-06-06 DIAGNOSIS — H5203 Hypermetropia, bilateral: Secondary | ICD-10-CM | POA: Diagnosis not present

## 2016-06-06 DIAGNOSIS — E119 Type 2 diabetes mellitus without complications: Secondary | ICD-10-CM | POA: Diagnosis not present

## 2016-06-25 ENCOUNTER — Ambulatory Visit (INDEPENDENT_AMBULATORY_CARE_PROVIDER_SITE_OTHER): Payer: 59 | Admitting: Family Medicine

## 2016-06-25 ENCOUNTER — Encounter: Payer: Self-pay | Admitting: Family Medicine

## 2016-06-25 VITALS — BP 120/70 | HR 75 | Temp 97.4°F | Resp 16 | Ht 66.0 in | Wt 171.0 lb

## 2016-06-25 DIAGNOSIS — I779 Disorder of arteries and arterioles, unspecified: Secondary | ICD-10-CM | POA: Diagnosis not present

## 2016-06-25 DIAGNOSIS — E785 Hyperlipidemia, unspecified: Secondary | ICD-10-CM

## 2016-06-25 DIAGNOSIS — I739 Peripheral vascular disease, unspecified: Secondary | ICD-10-CM

## 2016-06-25 DIAGNOSIS — N183 Chronic kidney disease, stage 3 unspecified: Secondary | ICD-10-CM

## 2016-06-25 DIAGNOSIS — E1121 Type 2 diabetes mellitus with diabetic nephropathy: Secondary | ICD-10-CM

## 2016-06-25 LAB — POCT GLYCOSYLATED HEMOGLOBIN (HGB A1C)
ESTIMATED AVERAGE GLUCOSE: 151
Hemoglobin A1C: 6.9

## 2016-06-25 MED ORDER — EPINEPHRINE 0.3 MG/0.3ML IJ SOAJ: 0.3000 mg | Freq: Once | INTRAMUSCULAR | 1 refills | Status: AC

## 2016-06-25 NOTE — Progress Notes (Signed)
[name] Male    DOB: [birth year]   76 y.o.   MRN: [id number] Visit Date: 06/25/2016  Today's Provider: Lelon Huh, MD   Chief Complaint  Patient presents with  . Follow-up  . Diabetes  . Hypertension  . Hyperlipidemia   Subjective:    HPI   Diabetes Mellitus Type II, Follow-up:   Lab Results  Component Value Date   HGBA1C 6.9 06/25/2016   HGBA1C 6.8 12/25/2015   HGBA1C 7.4 09/21/2015   Last seen for diabetes 6 months ago.  Management since then includes; no changes. He reports good compliance with treatment. He is not having side effects. none Current symptoms include none and have been unchanged. Home blood sugar records: fasting range: 125  Episodes of hypoglycemia? no   Current Insulin Regimen: n/a Most Recent Eye Exam: 4/18 [place] Weight trend: stable Prior visit with dietician: no Current diet: in general, a "healthy" diet   Current exercise: walking  ----------------------------------------------------------------    Hypertension, follow-up:  BP Readings from Last 3 Encounters:  06/25/16 120/70  03/27/16 118/70  12/25/15 138/78    He was last seen for hypertension 9 months ago.  BP at that visit was 126/80. Management since that visit includes; no cahnges.He reports good compliance with treatment. He is not having side effects. none He is exercising. He is adherent to low salt diet.   Outside blood pressures are normal. He is experiencing none.  Patient denies none.   Cardiovascular risk factors include diabetes mellitus.  Use of agents associated with hypertension: none.   ----------------------------------------------------------------    Lipid/Cholesterol, Follow-up:   Last seen for this 3 months ago.  Management since that visit includes; no cahnges.  Last Lipid Panel:    Component Value Date/Time   CHOL 155 03/23/2015 0904   CHOL 131 05/20/2012 0940   TRIG 191 (H) 03/23/2015  0904   TRIG 154 05/20/2012 0940   HDL 47 03/23/2015 0904   HDL 44 05/20/2012 0940   CHOLHDL 3.3 03/23/2015 0904   VLDL 31 05/20/2012 0940   LDLCALC 70 03/23/2015 0904   LDLCALC 56 05/20/2012 0940    He reports good compliance with treatment. He is not having side effects. none  Wt Readings from Last 3 Encounters:  06/25/16 171 lb (77.6 kg)  03/27/16 170 lb (77.1 kg)  12/25/15 168 lb (76.2 kg)    ----------------------------------------------------------------  Chronic kidney disease (CKD), stage III (moderate) From 12/25/2015-no changes. Stable.   Carotid artery disease, unspecified laterality ([place]) From 12/25/2015-no changes. Continue routine follow up and carotid ultrasounds with Dr. Lucky Cowboy.     Allergies  Allergen Reactions  . Hydrocodone-Guaifenesin Shortness Of Breath and Other (See Comments)    Not able to sit still when using Codiclear DH  . Bee Venom Other (See Comments)  . Codeine     Other reaction(s): Pruritus  . Morphine Sulfate     Other reaction(s): Pruritus  . Warfarin Sodium Diarrhea     Current Outpatient Prescriptions:  .  aspirin 81 MG tablet, Take 1 tablet by mouth daily., Disp: , Rfl:  .  clopidogrel (PLAVIX) 75 MG tablet, TAKE 1 TABLET BY MOUTH DAILY, Disp: 30 tablet, Rfl: 12 .  EPINEPHrine 0.3 mg/0.3 mL IJ SOAJ injection, 1 injection as directed, Disp: , Rfl:  .  LORazepam (ATIVAN) 1 MG tablet, TAKE 1/2 TO 1 TABLET BY MOUTH NIGHTLY AT BEDTIME, Disp: 90 tablet, Rfl: 4 .  losartan-hydrochlorothiazide (HYZAAR) 50-12.5 MG  tablet, TAKE 1 TABLET BY MOUTH ONCE DAILY, Disp: 30 tablet, Rfl: 11 .  metFORMIN (GLUCOPHAGE) 850 MG tablet, Take 1 tablet (850 mg total) by mouth 2 (two) times daily with a meal., Disp: 60 tablet, Rfl: 11 .  metoprolol tartrate (LOPRESSOR) 25 MG tablet, TAKE 1 TABLET BY MOUTH TWICE DAILY, Disp: 60 tablet, Rfl: 11 .  simvastatin (ZOCOR) 20 MG tablet, Take 1 tablet (20 mg total) by mouth daily., Disp: 90 tablet, Rfl: 4 .  TRUE  METRIX BLOOD GLUCOSE TEST test strip, USE DAILY, Disp: 150 each, Rfl: 5  Review of Systems  Constitutional: Negative for appetite change, chills and fever.  Respiratory: Negative for chest tightness, shortness of breath and wheezing.   Cardiovascular: Negative for chest pain and palpitations.  Gastrointestinal: Negative for abdominal pain, nausea and vomiting.    Social History  Substance Use Topics  . Smoking status: Former Smoker    Packs/day: 0.51    Types: Cigarettes    Quit date: 08/23/2013  . Smokeless tobacco: Never Used  . Alcohol use No   Objective:   BP 120/70 (BP Location: Left Arm, Patient Position: Sitting, Cuff Size: Large)   Pulse 75   Temp 97.4 F (36.3 C) (Oral)   Resp 16   Ht 5\' 6"  (1.676 m)   Wt 171 lb (77.6 kg)   SpO2 99%   BMI 27.60 kg/m  Vitals:   06/25/16 0825  BP: 120/70  Pulse: 75  Resp: 16  Temp: 97.4 F (36.3 C)  TempSrc: Oral  SpO2: 99%  Weight: 171 lb (77.6 kg)  Height: 5\' 6"  (1.676 m)     Physical Exam   General Appearance:    Alert, cooperative, no distress  Eyes:    PERRL, conjunctiva/corneas clear, EOM's intact       Lungs:     Clear to auscultation bilaterally, respirations unlabored  Heart:    Regular rate and rhythm  Neurologic:   Awake, alert, oriented x 3. No apparent focal neurological           defect.        Results for orders placed or performed in visit on 06/25/16  POCT glycosylated hemoglobin (Hb A1C)  Result Value Ref Range   Hemoglobin A1C 6.9    Est. 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  Continue current medications.   - POCT glycosylated hemoglobin (Hb A1C)  2. Carotid artery disease, unspecified laterality ([facility]) Asymptomatic. Compliant with medication.  Continue aggressive risk factor modification.    3. Chronic kidney disease (CKD), stage III (moderate) Recheck renal functions.   4. Hyperlipidemia,  unspecified hyperlipidemia type He is tolerating simvastatin well with no adverse effects.   - Comprehensive metabolic panel - Lipid panel       Lelon Huh, MD  [facility]

## 2016-06-26 ENCOUNTER — Telehealth: Payer: Self-pay

## 2016-06-26 LAB — COMPREHENSIVE METABOLIC PANEL
A/G RATIO: 1.8 (ref 1.2–2.2)
ALBUMIN: 4.9 g/dL — AB (ref 3.5–4.8)
ALT: 15 IU/L (ref 0–44)
AST: 22 IU/L (ref 0–40)
Alkaline Phosphatase: 51 IU/L (ref 39–117)
BUN / CREAT RATIO: 22 (ref 10–24)
BUN: 35 mg/dL — ABNORMAL HIGH (ref 8–27)
Bilirubin Total: 0.9 mg/dL (ref 0.0–1.2)
CALCIUM: 10.5 mg/dL — AB (ref 8.6–10.2)
CO2: 26 mmol/L (ref 18–29)
Chloride: 100 mmol/L (ref 96–106)
Creatinine, Ser: 1.61 mg/dL — ABNORMAL HIGH (ref 0.76–1.27)
GFR, EST AFRICAN AMERICAN: 48 mL/min/{1.73_m2} — AB (ref >59)
GFR, EST NON AFRICAN AMERICAN: 42 mL/min/{1.73_m2} — AB (ref >59)
Globulin, Total: 2.7 g/dL (ref 1.5–4.5)
Glucose: 115 mg/dL — ABNORMAL HIGH (ref 65–99)
POTASSIUM: 5.1 mmol/L (ref 3.5–5.2)
Sodium: 142 mmol/L (ref 134–144)
TOTAL PROTEIN: 7.6 g/dL (ref 6.0–8.5)

## 2016-06-26 LAB — LIPID PANEL
CHOL/HDL RATIO: 3 ratio (ref 0.0–5.0)
Cholesterol, Total: 140 mg/dL (ref 100–199)
HDL: 46 mg/dL (ref >39)
LDL Calculated: 62 mg/dL (ref 0–99)
Triglycerides: 160 mg/dL — ABNORMAL HIGH (ref 0–149)
VLDL CHOLESTEROL CAL: 32 mg/dL (ref 5–40)

## 2016-06-26 NOTE — Telephone Encounter (Signed)
-----   Message from Birdie Sons, MD sent at 06/26/2016  7:57 AM EDT ----- Kidney functions a little worse, drink more water. Otherwise labs are good. Continue current medications.  Follow up in November as scheduled.

## 2016-06-26 NOTE — Telephone Encounter (Signed)
LMTCB

## 2016-06-26 NOTE — Telephone Encounter (Signed)
LMTCB 06/26/2016  Thanks,   -Mickel Baas

## 2016-06-26 NOTE — Telephone Encounter (Signed)
Patient advised as below. Patient verbalizes understanding and is in agreement with treatment plan.  

## 2016-06-27 ENCOUNTER — Other Ambulatory Visit: Payer: Self-pay | Admitting: Family Medicine

## 2016-07-02 ENCOUNTER — Encounter: Payer: Self-pay | Admitting: *Deleted

## 2016-07-03 ENCOUNTER — Ambulatory Visit: Payer: Self-pay | Admitting: Pharmacist

## 2016-07-03 ENCOUNTER — Ambulatory Visit: Payer: 59 | Admitting: Anesthesiology

## 2016-07-03 ENCOUNTER — Encounter
Admission: RE | Disposition: A | Discharge status: Home / Self Care | Payer: Self-pay | Source: Ambulatory Visit | Attending: Unknown Physician Specialty

## 2016-07-03 ENCOUNTER — Ambulatory Visit
Admission: RE | Admit: 2016-07-03 | Discharge: 2016-07-03 | Disposition: A | Discharge status: Home / Self Care | Payer: 59 | Source: Ambulatory Visit | Attending: Unknown Physician Specialty | Admitting: Unknown Physician Specialty

## 2016-07-03 DIAGNOSIS — G4700 Insomnia, unspecified: Secondary | ICD-10-CM | POA: Diagnosis not present

## 2016-07-03 DIAGNOSIS — E1122 Type 2 diabetes mellitus with diabetic chronic kidney disease: Secondary | ICD-10-CM | POA: Diagnosis not present

## 2016-07-03 DIAGNOSIS — Z1211 Encounter for screening for malignant neoplasm of colon: Secondary | ICD-10-CM | POA: Diagnosis not present

## 2016-07-03 DIAGNOSIS — Z8673 Personal history of transient ischemic attack (TIA), and cerebral infarction without residual deficits: Secondary | ICD-10-CM | POA: Diagnosis not present

## 2016-07-03 DIAGNOSIS — I739 Peripheral vascular disease, unspecified: Secondary | ICD-10-CM | POA: Diagnosis not present

## 2016-07-03 DIAGNOSIS — N189 Chronic kidney disease, unspecified: Secondary | ICD-10-CM | POA: Insufficient documentation

## 2016-07-03 DIAGNOSIS — Z86010 Personal history of colonic polyps: Secondary | ICD-10-CM | POA: Insufficient documentation

## 2016-07-03 DIAGNOSIS — F329 Major depressive disorder, single episode, unspecified: Secondary | ICD-10-CM | POA: Diagnosis not present

## 2016-07-03 DIAGNOSIS — Z539 Procedure and treatment not carried out, unspecified reason: Secondary | ICD-10-CM | POA: Insufficient documentation

## 2016-07-03 DIAGNOSIS — F419 Anxiety disorder, unspecified: Secondary | ICD-10-CM | POA: Diagnosis not present

## 2016-07-03 DIAGNOSIS — E785 Hyperlipidemia, unspecified: Secondary | ICD-10-CM | POA: Diagnosis not present

## 2016-07-03 DIAGNOSIS — I129 Hypertensive chronic kidney disease with stage 1 through stage 4 chronic kidney disease, or unspecified chronic kidney disease: Secondary | ICD-10-CM | POA: Diagnosis not present

## 2016-07-03 LAB — GLUCOSE, CAPILLARY: Glucose-Capillary: 145 mg/dL — ABNORMAL HIGH (ref 65–99)

## 2016-07-03 SURGERY — COLONOSCOPY WITH PROPOFOL
Anesthesia: General

## 2016-07-03 MED ORDER — SODIUM CHLORIDE 0.9 % IV SOLN
INTRAVENOUS | Status: DC
  Administered 2016-07-03: 1000 mL via INTRAVENOUS
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
## 2016-08-14 ENCOUNTER — Other Ambulatory Visit: Payer: Self-pay | Admitting: Family Medicine

## 2016-08-14 NOTE — Telephone Encounter (Signed)
Please call in lorazepam.  

## 2016-08-15 ENCOUNTER — Other Ambulatory Visit: Payer: Self-pay | Admitting: Family Medicine

## 2016-08-15 NOTE — Telephone Encounter (Signed)
rx called in-aa 

## 2016-08-16 NOTE — Telephone Encounter (Signed)
rx called in yesterday 08/15/16-aa

## 2016-08-16 NOTE — Telephone Encounter (Signed)
Please call in lorazepam.  

## 2016-08-22 ENCOUNTER — Other Ambulatory Visit: Payer: Self-pay | Admitting: Pharmacist

## 2016-08-22 NOTE — Patient Outreach (Signed)
Communicated with wife on 06/27/16. Received e-mail with updated A1c 6.9% on 06/25/16. Wife stated that Bobby Thomas is doing great. Saw Dr. Caryn Section 06-25-16 and will see Dr. Lucky Cowboy in July. Had a colonoscopy scheduled for 07-03-16; will confirm results with patient at appointment in September. I was not able to document this information at that time, as I had to go out of town due to urgent family health issues.  [facility] CM Care Plan Problem One     Most Recent Value  Care Plan Problem One  At risk of elevated A1c  Role Documenting the Problem One  Clinical Pharmacist  Care Plan for Problem One  Not Active  [facility] Long Term Goal   Over the next 90 days, patient will maintain A1c < 7%,  evidenced by patient report or lab value  [facility] Long Term Goal Start Date  03/27/16  [facility] Long Term Goal Met Date  06/25/16 [A1c = 6.9%]  Interventions for Problem One Long Term Goal  Patient encouraged to continue exercising 5-6 days per week, 60 minutes per session. Encouraged to take all medications as directed.       Plan: 6 month LTW follow up scheduled for 10/09/16 with the pharmacist.  Bobby Thomas. Bobby Thomas, PharmD [facility] [phone]

## 2016-09-02 ENCOUNTER — Other Ambulatory Visit (INDEPENDENT_AMBULATORY_CARE_PROVIDER_SITE_OTHER): Payer: Self-pay | Admitting: Vascular Surgery

## 2016-09-02 DIAGNOSIS — I739 Peripheral vascular disease, unspecified: Principal | ICD-10-CM

## 2016-09-02 DIAGNOSIS — I779 Disorder of arteries and arterioles, unspecified: Secondary | ICD-10-CM

## 2016-09-03 ENCOUNTER — Ambulatory Visit (INDEPENDENT_AMBULATORY_CARE_PROVIDER_SITE_OTHER): Payer: 59

## 2016-09-03 ENCOUNTER — Encounter (INDEPENDENT_AMBULATORY_CARE_PROVIDER_SITE_OTHER): Payer: Self-pay | Admitting: Vascular Surgery

## 2016-09-03 ENCOUNTER — Ambulatory Visit (INDEPENDENT_AMBULATORY_CARE_PROVIDER_SITE_OTHER): Payer: 59 | Admitting: Vascular Surgery

## 2016-09-03 VITALS — BP 133/85 | HR 64 | Resp 16 | Wt 176.0 lb

## 2016-09-03 DIAGNOSIS — E1121 Type 2 diabetes mellitus with diabetic nephropathy: Secondary | ICD-10-CM | POA: Diagnosis not present

## 2016-09-03 DIAGNOSIS — E785 Hyperlipidemia, unspecified: Secondary | ICD-10-CM | POA: Diagnosis not present

## 2016-09-03 DIAGNOSIS — I779 Disorder of arteries and arterioles, unspecified: Secondary | ICD-10-CM

## 2016-09-03 DIAGNOSIS — I10 Essential (primary) hypertension: Secondary | ICD-10-CM

## 2016-09-03 DIAGNOSIS — I739 Peripheral vascular disease, unspecified: Principal | ICD-10-CM
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
## 2016-09-03 NOTE — Assessment & Plan Note (Signed)
blood glucose control important in reducing the progression of atherosclerotic disease. Also, involved in wound healing. On appropriate medications.  

## 2016-09-03 NOTE — Assessment & Plan Note (Signed)
His carotid duplex today shows a widely patent right carotid artery stent with a known left carotid artery occlusion. He will continue his current medical regimen including aspirin, Plavix, and Zocor. He is 10 years out from his surgery he asks about extending his visits to every other year and I think that is reasonable. He will contact my office with any problems in the interim.

## 2016-09-03 NOTE — Assessment & Plan Note (Signed)
lipid control important in reducing the progression of atherosclerotic disease. Continue statin therapy  

## 2016-09-03 NOTE — Assessment & Plan Note (Signed)
blood pressure control important in reducing the progression of atherosclerotic disease. On appropriate oral medications.  

## 2016-09-03 NOTE — Patient Instructions (Signed)
Carotid Artery Disease The carotid arteries are arteries on both sides of the neck. They carry blood to the brain. Carotid artery disease is when the arteries get smaller (narrow) or get blocked. If these arteries get smaller or get blocked, you are more likely to have a stroke or warning stroke (transient ischemic attack). Follow these instructions at home:  Take medicines as told by your doctor. Make sure you understand all your medicine instructions. Do not stop your medicines without talking to your doctor first.  Follow your doctor's diet instructions. It is important to eat a healthy diet that includes plenty of: ? Fresh fruits. ? Vegetables. ? Lean meats.  Avoid: ? High-fat foods. ? High-sodium foods. ? Foods that are fried, overly processed, or have poor nutritional value.  Stay a healthy weight.  Stay active. Get at least 30 minutes of activity every day.  Do not smoke.  Limit alcohol use to: ? No more than 2 drinks a day for men. ? No more than 1 drink a day for women who are not pregnant.  Do not use illegal drugs.  Keep all doctor visits as told. Get help right away if:  You have sudden weakness or loss of feeling (numbness) on one side of the body, such as the face, arm, or leg.  You have sudden confusion.  You have trouble speaking (aphasia) or understanding.  You have sudden trouble seeing out of one or both eyes.  You have sudden trouble walking.  You have dizziness or feel like you might pass out (faint).  You have a loss of balance or your movements are not steady (uncoordinated).  You have a sudden, severe headache with no known cause.  You have trouble swallowing (dysphagia). Call your local emergency services (911 in [place] Do notdrive yourself to the clinic or hospital. This information is not intended to replace advice given to you by your health care provider. Make sure you discuss any questions you have with your health care  provider. Document Released: 01/08/2012 Document Revised: 06/29/2015 Document Reviewed: 07/22/2012 Elsevier Interactive Patient Education  2018 Elsevier Inc.  

## 2016-10-01 ENCOUNTER — Encounter: Payer: Self-pay | Admitting: *Deleted

## 2016-10-02 ENCOUNTER — Ambulatory Visit
Admission: RE | Admit: 2016-10-02 | Discharge: 2016-10-02 | Disposition: A | Discharge status: Home / Self Care | Payer: 59 | Source: Ambulatory Visit | Attending: Unknown Physician Specialty | Admitting: Unknown Physician Specialty

## 2016-10-02 ENCOUNTER — Ambulatory Visit: Payer: 59 | Admitting: Anesthesiology

## 2016-10-02 ENCOUNTER — Encounter
Admission: RE | Disposition: A | Discharge status: Home / Self Care | Payer: Self-pay | Source: Ambulatory Visit | Attending: Unknown Physician Specialty

## 2016-10-02 DIAGNOSIS — G4700 Insomnia, unspecified: Secondary | ICD-10-CM | POA: Diagnosis not present

## 2016-10-02 DIAGNOSIS — Z8673 Personal history of transient ischemic attack (TIA), and cerebral infarction without residual deficits: Secondary | ICD-10-CM | POA: Diagnosis not present

## 2016-10-02 DIAGNOSIS — K635 Polyp of colon: Secondary | ICD-10-CM | POA: Diagnosis not present

## 2016-10-02 DIAGNOSIS — Z833 Family history of diabetes mellitus: Secondary | ICD-10-CM | POA: Diagnosis not present

## 2016-10-02 DIAGNOSIS — I10 Essential (primary) hypertension: Secondary | ICD-10-CM | POA: Diagnosis not present

## 2016-10-02 DIAGNOSIS — Z7901 Long term (current) use of anticoagulants: Secondary | ICD-10-CM | POA: Diagnosis not present

## 2016-10-02 DIAGNOSIS — Z91030 Bee allergy status: Secondary | ICD-10-CM | POA: Diagnosis not present

## 2016-10-02 DIAGNOSIS — Z888 Allergy status to other drugs, medicaments and biological substances status: Secondary | ICD-10-CM | POA: Insufficient documentation

## 2016-10-02 DIAGNOSIS — F329 Major depressive disorder, single episode, unspecified: Secondary | ICD-10-CM | POA: Diagnosis not present

## 2016-10-02 DIAGNOSIS — Z86010 Personal history of colonic polyps: Secondary | ICD-10-CM | POA: Insufficient documentation

## 2016-10-02 DIAGNOSIS — Z87891 Personal history of nicotine dependence: Secondary | ICD-10-CM | POA: Diagnosis not present

## 2016-10-02 DIAGNOSIS — N189 Chronic kidney disease, unspecified: Secondary | ICD-10-CM | POA: Insufficient documentation

## 2016-10-02 DIAGNOSIS — E785 Hyperlipidemia, unspecified: Secondary | ICD-10-CM | POA: Diagnosis not present

## 2016-10-02 DIAGNOSIS — Z885 Allergy status to narcotic agent status: Secondary | ICD-10-CM | POA: Diagnosis not present

## 2016-10-02 DIAGNOSIS — I129 Hypertensive chronic kidney disease with stage 1 through stage 4 chronic kidney disease, or unspecified chronic kidney disease: Secondary | ICD-10-CM | POA: Diagnosis not present

## 2016-10-02 DIAGNOSIS — E119 Type 2 diabetes mellitus without complications: Secondary | ICD-10-CM | POA: Diagnosis not present

## 2016-10-02 DIAGNOSIS — Z9889 Other specified postprocedural states: Secondary | ICD-10-CM | POA: Insufficient documentation

## 2016-10-02 DIAGNOSIS — Z1211 Encounter for screening for malignant neoplasm of colon: Secondary | ICD-10-CM | POA: Insufficient documentation

## 2016-10-02 DIAGNOSIS — F419 Anxiety disorder, unspecified: Secondary | ICD-10-CM | POA: Insufficient documentation

## 2016-10-02 DIAGNOSIS — Z8249 Family history of ischemic heart disease and other diseases of the circulatory system: Secondary | ICD-10-CM | POA: Diagnosis not present

## 2016-10-02 DIAGNOSIS — D123 Benign neoplasm of transverse colon: Secondary | ICD-10-CM | POA: Diagnosis not present

## 2016-10-02 DIAGNOSIS — E1122 Type 2 diabetes mellitus with diabetic chronic kidney disease: Secondary | ICD-10-CM | POA: Diagnosis not present

## 2016-10-02 DIAGNOSIS — E1151 Type 2 diabetes mellitus with diabetic peripheral angiopathy without gangrene: Secondary | ICD-10-CM | POA: Diagnosis not present

## 2016-10-02 DIAGNOSIS — Z7984 Long term (current) use of oral hypoglycemic drugs: Secondary | ICD-10-CM | POA: Insufficient documentation

## 2016-10-02 DIAGNOSIS — Z7982 Long term (current) use of aspirin: Secondary | ICD-10-CM | POA: Diagnosis not present

## 2016-10-02 DIAGNOSIS — D125 Benign neoplasm of sigmoid colon: Secondary | ICD-10-CM | POA: Insufficient documentation

## 2016-10-02 DIAGNOSIS — I4891 Unspecified atrial fibrillation: Secondary | ICD-10-CM | POA: Diagnosis not present

## 2016-10-02 HISTORY — PX: COLONOSCOPY WITH PROPOFOL: SHX5780

## 2016-10-02 LAB — GLUCOSE, CAPILLARY: GLUCOSE-CAPILLARY: 112 mg/dL — AB (ref 65–99)

## 2016-10-02 SURGERY — COLONOSCOPY WITH PROPOFOL
Anesthesia: General

## 2016-10-02 MED ORDER — SODIUM CHLORIDE 0.9 % IV SOLN
INTRAVENOUS | Status: DC
  Administered 2016-10-02: 09:00:00 via INTRAVENOUS

## 2016-10-02 MED ORDER — PROPOFOL 10 MG/ML IV BOLUS
INTRAVENOUS | Status: DC | PRN
  Administered 2016-10-02: 40 mg via INTRAVENOUS

## 2016-10-02 MED ORDER — LIDOCAINE HCL (PF) 2 % IJ SOLN
INTRAMUSCULAR | Status: AC
  Filled 2016-10-02: qty 2

## 2016-10-02 MED ORDER — LIDOCAINE HCL (CARDIAC) 20 MG/ML IV SOLN
INTRAVENOUS | Status: DC | PRN
  Administered 2016-10-02: 100 mg via INTRAVENOUS

## 2016-10-02 MED ORDER — SODIUM CHLORIDE 0.9 % IV SOLN: INTRAVENOUS | Status: DC

## 2016-10-02 MED ORDER — PROPOFOL 500 MG/50ML IV EMUL
INTRAVENOUS | Status: DC | PRN
  Administered 2016-10-02: 150 ug/kg/min via INTRAVENOUS

## 2016-10-02 MED ORDER — PROPOFOL 500 MG/50ML IV EMUL
INTRAVENOUS | Status: AC
  Filled 2016-10-02: qty 50

## 2016-10-02 MED ORDER — PROPOFOL 10 MG/ML IV BOLUS
INTRAVENOUS | Status: AC
Start: 2016-10-02 — End: ?
  Filled 2016-10-02: qty 20

## 2016-10-02 NOTE — Op Note (Signed)
[facility] Gastroenterology Patient Name: Bobby Thomas Procedure Date: 10/02/2016 9:24 AM MRN: [id number] Account #: [id number] Date of Birth: [birth year] Admit Type: Outpatient Age: 76 Room: [facility] ENDO ROOM 3 Gender: Male Note Status: Finalized Procedure:            Colonoscopy Indications:          High risk colon cancer surveillance: Personal history                        of colonic polyps Providers:            Manya Silvas, MD Referring MD:         Kirstie Peri. Caryn Section, MD (Referring MD) Medicines:            Propofol per Anesthesia Complications:        No immediate complications. Procedure:            Pre-Anesthesia Assessment:                       - After reviewing the risks and benefits, the patient                        was deemed in satisfactory condition to undergo the                        procedure.                       After obtaining informed consent, the colonoscope was                        passed under direct vision. Throughout the procedure,                        the patient's blood pressure, pulse, and oxygen                        saturations were monitored continuously. The                        Colonoscope was introduced through the anus and                        advanced to the the cecum, identified by appendiceal                        orifice and ileocecal valve. The colonoscopy was                        performed without difficulty. The patient tolerated the                        procedure well. The quality of the bowel preparation                        was excellent. Findings:      A small polyp was found in the transverse colon. The polyp was sessile.       The polyp was removed with a hot snare. Resection and retrieval were       complete. To prevent bleeding after the polypectomy, one hemostatic clip  was successfully placed. There was no bleeding during, or at the end, of       the procedure.      A diminutive  polyp was found in the transverse colon. The polyp was       sessile. The polyp was removed with a jumbo cold forceps. Resection and       retrieval were complete.      Two sessile polyps were found in the sigmoid colon. The polyps were       diminutive in size. These polyps were removed with a jumbo cold forceps.       Resection and retrieval were complete. Impression:           - One small polyp in the transverse colon, removed with                        a hot snare. Resected and retrieved. Clip was placed.                       - One diminutive polyp in the transverse colon, removed                        with a jumbo cold forceps. Resected and retrieved.                       - Two diminutive polyps in the sigmoid colon, removed                        with a jumbo cold forceps. Resected and retrieved. Recommendation:       - Await pathology results. Manya Silvas, MD 10/02/2016 9:57:21 AM This report has been signed electronically. Number of Addenda: 0 Note Initiated On: 10/02/2016 9:24 AM Scope Withdrawal Time: 0 hours 15 minutes 25 seconds  Total Procedure Duration: 0 hours 21 minutes 39 seconds       [facility]

## 2016-10-02 NOTE — Anesthesia Postprocedure Evaluation (Signed)
Anesthesia Post Note  Patient: CAMERON SCHWINN  Procedure(s) Performed: Procedure(s) (LRB): COLONOSCOPY WITH PROPOFOL (N/A)  Patient location during evaluation: Endoscopy Anesthesia Type: General Level of consciousness: awake and alert Pain management: pain level controlled Vital Signs Assessment: post-procedure vital signs reviewed and stable Respiratory status: spontaneous breathing, nonlabored ventilation, respiratory function stable and patient connected to nasal cannula oxygen Cardiovascular status: blood pressure returned to baseline and stable Postop Assessment: no signs of nausea or vomiting Anesthetic complications: no     Last Vitals:  Vitals:   10/02/16 1007 10/02/16 1027  BP: 125/76 113/70  Pulse: (!) 54 67  Resp: 13 17  Temp:    SpO2: 98% (!) 88%    Last Pain:  Vitals:   10/02/16 0957  TempSrc: Tympanic                 Khaleed Holan S

## 2016-10-02 NOTE — Transfer of Care (Signed)
Immediate Anesthesia Transfer of Care Note  [name]  Procedure(s) Performed: Procedure(s): COLONOSCOPY WITH PROPOFOL (N/A)  Patient Location: PACU  Anesthesia Type:General  Level of Consciousness: drowsy and responds to stimulation  Airway & Oxygen Therapy: Patient Spontanous Breathing and Patient connected to nasal cannula oxygen  Post-op Assessment: Report given to RN and Post -op Vital signs reviewed and stable  Post vital signs: Reviewed and stable  Last Vitals:  Vitals:   10/02/16 0950 10/02/16 0957  BP: 92/61 92/61  Pulse: 61 (!) 57  Resp: 13 14  Temp: 36.5 C 36.5 C  SpO2:  98%    Last Pain:  Vitals:   10/02/16 0957  TempSrc: Tympanic         Complications: No apparent anesthesia complications
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
## 2016-10-02 NOTE — Anesthesia Post-op Follow-up Note (Signed)
Anesthesia QCDR form completed.        

## 2016-10-02 NOTE — Anesthesia Procedure Notes (Signed)
Date/Time: 10/02/2016 9:23 AM Performed by: Darlyne Russian Pre-anesthesia Checklist: Patient identified, Emergency Drugs available, Suction available, Patient being monitored and Timeout performed Patient Re-evaluated:Patient Re-evaluated prior to induction Oxygen Delivery Method: Nasal cannula Placement Confirmation: positive ETCO2

## 2016-10-03 ENCOUNTER — Encounter: Payer: Self-pay | Admitting: Unknown Physician Specialty

## 2016-10-03 LAB — SURGICAL PATHOLOGY

## 2016-10-09 ENCOUNTER — Encounter: Payer: Self-pay | Admitting: Pharmacist

## 2016-10-09 ENCOUNTER — Other Ambulatory Visit: Payer: Self-pay | Admitting: Pharmacist

## 2016-10-11 NOTE — Patient Outreach (Signed)
[facility]) Care Management  [place]   10/11/2016  ORVILL COULTHARD 03/02/1940 [id number]  Subjective: Mr. Delker is a 76 year old male here today with his wife for his Link To Wellness visit. He is very positive about his health and voices no concerns at this visit. He is current with his dental and vision exams. He was last seen in May by his primary care provider, Dr. Caryn Section. He continues to exercise 5-6 days per week for 60 minutes per session. He does not qualify for the [facility] program at this time.  Objective:  Vitals:   10/09/16 0853  BP: 122/70  Weight: 170 lb (77.1 kg)  Height: 1.676 m (5\' 6" )   Labs: A1c = 6.9% 06/25/16 Renal Panel 06/25/16: SCr = 1.61 mg/dl BUN = 35 mg/dl GFR = 42 ml/min/1.73 Lipid Panel 06/25/16: TC = 140 mg/dl; TG = 160 mg/dl; HDL= 46 mg/dl; LDL = 62 mg/dl   Encounter Medications: Outpatient Encounter Prescriptions as of 10/09/2016  Medication Sig  . aspirin 81 MG tablet Take 1 tablet by mouth daily.  . clopidogrel (PLAVIX) 75 MG tablet TAKE 1 TABLET BY MOUTH DAILY  . FREESTYLE LITE test strip USE AS DIRECTED DAILY  . LORazepam (ATIVAN) 1 MG tablet TAKE 1/2 TO 1 TABLET BY MOUTH NIGHTLY AT BEDTIME  . losartan-hydrochlorothiazide (HYZAAR) 50-12.5 MG tablet TAKE 1 TABLET BY MOUTH ONCE DAILY  . metFORMIN (GLUCOPHAGE) 850 MG tablet Take 1 tablet (850 mg total) by mouth 2 (two) times daily with a meal.  . metoprolol tartrate (LOPRESSOR) 25 MG tablet TAKE 1 TABLET BY MOUTH TWICE DAILY  . simvastatin (ZOCOR) 20 MG tablet Take 1 tablet (20 mg total) by mouth daily.   No facility-administered encounter medications on file as of 10/09/2016.     Functional Status: In your present state of health, do you have any difficulty performing the following activities: 10/09/2016 03/27/2016  Hearing? N N  Vision? N N  Difficulty concentrating or making decisions? N N  Walking or climbing stairs? N N  Dressing or bathing? N N  Comment - -   Doing errands, shopping? N N  Some recent data might be hidden    Fall/Depression Screening: Fall Risk  10/09/2016 03/27/2016 03/29/2015  Falls in the past year? No No -  Number falls in past yr: - - -  Injury with Fall? - - (No Data)  Comment - - Patient fell in his work area outside   [facility] 2/9 Scores 10/09/2016 03/27/2016 09/27/2015 03/29/2015  PHQ - 2 Score 0 0 0 0    THN CM Care Plan Problem One     Most Recent Value  Care Plan Problem One  At risk of elevated A1c  Role Documenting the Problem One  Clinical Pharmacist  Care Plan for Problem One  Active  THN Long Term Goal   Over the next 90 days, patient will maintain A1c < 7%,  evidenced by patient report or lab value  THN Long Term Goal Start Date  10/09/16  Interventions for Problem One Long Term Goal  Patient encouraged to continue exercising 5-6 days per week, 60 minutes per session. Encouraged to take all medications as directed.       Assessment: Diabetes: A1c at goal of less than 7%. His morning blood glucose value today was 115 mg/dl mg/dl range. He is currently on metformin, aspirin, losartan and simvastatin. He does not qualify for the [facility] program as he does not have a smart  phone. Cholesterol: TC within goal of less than 200 mg/dl; HDL within goal of greater than 40 mg/dl; LDL within goal of less than 100 mg/dl; TG not within goal of less than 150 mg/dl.  Hypertension: Blood Pressure within goal of less than 140/90 mmHg (per ADA guidelines).  Renal Function: Stage III chronic kidney disease per chart. 06/25/16 lab values indicate elevation in SCr and BUN and decrease in GFR from November 2017 values. CVA: Hx of CVA 2007. Hx of carotid stenosis, S/p stenting 2008. Annual visit in July with Dr. Lucky Cowboy went well, patient's doctor moved visits to every other year. Continues to take aspirin, clopidogrel and simvastatin.  Plan:  1. Patient has an appointment scheduled with Dr. Caryn Section in [facility] Diabetes management. 2.  Patient will follow up with Dr. Lucky Cowboy 08/2018 regarding stents.  3. Patient will follow up with the pharmacist in 6 months for the Link To Wellness visit. Pharmacist to call patient in 3 months for care plan assessment. 4. Encouraged patient to continue going to all appointments and continue exercising 5 days per week.  Lean Fayson K. [name], PharmD [place] [facility] [phone]

## 2016-10-31 ENCOUNTER — Other Ambulatory Visit: Payer: Self-pay | Admitting: Family Medicine

## 2016-11-09 ENCOUNTER — Ambulatory Visit (INDEPENDENT_AMBULATORY_CARE_PROVIDER_SITE_OTHER): Payer: Medicare Other

## 2016-11-09 DIAGNOSIS — Z23 Encounter for immunization: Secondary | ICD-10-CM

## 2016-12-16 ENCOUNTER — Other Ambulatory Visit: Payer: Self-pay | Admitting: Family Medicine

## 2016-12-23 ENCOUNTER — Other Ambulatory Visit: Payer: Self-pay | Admitting: Family Medicine

## 2016-12-23 NOTE — Telephone Encounter (Signed)
Pharmacy requesting refills. Thanks!  

## 2016-12-30 NOTE — Progress Notes (Signed)
[name] Male    DOB: 01-Feb-1941   76 y.o.   MRN: [id number] Visit Date: 12/30/2016  Today's Provider: Lelon Huh, MD   Chief Complaint  Patient presents with  . Diabetes  . Chronic Kidney Disease  . Hyperlipidemia   Subjective:    HPI   Diabetes Mellitus Type II, Follow-up:   Lab Results  Component Value Date   HGBA1C 6.9 06/25/2016   HGBA1C 6.8 12/25/2015   HGBA1C 7.4 09/21/2015   Last seen for diabetes 6 months ago.  Management since then includes; no changes. He reports good compliance with treatment. He is not having side effects.  Current symptoms include none and have been stable. Home blood sugar records: fasting range: 112-125  Episodes of hypoglycemia? no   Current Insulin Regimen: none Most Recent Eye Exam: 09/2016 Weight trend: fluctuating a bit Prior visit with dietician: no Current diet: in general, a "healthy" diet   Current exercise: walking  ------------------------------------------------------------------------   Lipid/Cholesterol, Follow-up:   Last seen for this 6 months ago.  Management since that visit includes; labs checked, no changes.  Last Lipid Panel:    Component Value Date/Time   CHOL 140 06/25/2016 0853   CHOL 131 05/20/2012 0940   TRIG 160 (H) 06/25/2016 0853   TRIG 154 05/20/2012 0940   HDL 46 06/25/2016 0853   HDL 44 05/20/2012 0940   CHOLHDL 3.0 06/25/2016 0853   VLDL 31 05/20/2012 0940   LDLCALC 62 06/25/2016 0853   LDLCALC 56 05/20/2012 0940    He reports good compliance with treatment. He is not having side effects.   Wt Readings from Last 3 Encounters:  10/09/16 170 lb (77.1 kg)  10/02/16 171 lb (77.6 kg)  09/03/16 176 lb (79.8 kg)    ------------------------------------------------------------------------  Carotid artery disease, unspecified laterality (HCC) From 06/25/2016-no changes. Patient reports good compliance with treatment, and good tolerance. He had recent follow up  with [facility] and reports he will follow up every 2 years.   Chronic kidney disease (CKD), stage III (moderate) From 06/25/2016-Rechecked renal functions-which were a little worse. Recommended he drink more water. Patient reports good compliance with treatment.   Allergies  Allergen Reactions  . Hydrocodone-Guaifenesin Shortness Of Breath and Other (See Comments)    Not able to sit still when using Codiclear DH  . Bee Venom Other (See Comments)  . Codeine     Other reaction(s): Pruritus  . Morphine Sulfate     Other reaction(s): Pruritus  . Warfarin Sodium Diarrhea     Current Outpatient Medications:  .  aspirin 81 MG tablet, Take 1 tablet by mouth daily., Disp: , Rfl:  .  clopidogrel (PLAVIX) 75 MG tablet, TAKE 1 TABLET BY MOUTH DAILY, Disp: 30 tablet, Rfl: 12 .  FREESTYLE LITE test strip, USE AS DIRECTED DAILY, Disp: 100 each, Rfl: 5 .  Lancets (FREESTYLE) lancets, USE AS DIRECTED DAILY, Disp: 100 each, Rfl: 4 .  LORazepam (ATIVAN) 1 MG tablet, TAKE 1/2 TO 1 TABLET BY MOUTH NIGHTLY AT BEDTIME, Disp: 90 tablet, Rfl: 4 .  losartan-hydrochlorothiazide (HYZAAR) 50-12.5 MG tablet, TAKE 1 TABLET BY MOUTH ONCE DAILY, Disp: 30 tablet, Rfl: 11 .  metFORMIN (GLUCOPHAGE) 850 MG tablet, Take 1 tablet (850 mg total) by mouth 2 (two) times daily with a meal., Disp: 60 tablet, Rfl: 11 .  metoprolol tartrate (LOPRESSOR) 25 MG tablet, TAKE 1 TABLET BY MOUTH TWICE DAILY, Disp: 60 tablet, Rfl: 11 .  simvastatin (ZOCOR) 20  MG tablet, Take 1 tablet (20 mg total) by mouth daily., Disp: 90 tablet, Rfl: 4  Review of Systems  Constitutional: Negative for appetite change, chills and fever.  Respiratory: Negative for chest tightness, shortness of breath and wheezing.   Cardiovascular: Negative for chest pain and palpitations.  Gastrointestinal: Negative for abdominal pain, nausea and vomiting.    Social History   Tobacco Use  . Smoking status: Former Smoker    Packs/day: 0.51    Types: Cigarettes    Last  attempt to quit: 08/23/2013    Years since quitting: 3.3  . Smokeless tobacco: Never Used  Substance Use Topics  . Alcohol use: No    Alcohol/week: 0.0 oz   Objective:   BP (!) 152/72 (BP Location: Left Arm, Cuff Size: Normal)   Pulse 75   Temp 97.6 F (36.4 C) (Oral)   Resp 16   Wt 178 lb (80.7 kg)   SpO2 99% Comment: room air  BMI 28.73 kg/m  Vitals:   12/31/16 0813 12/31/16 0816 12/31/16 0837  BP: 138/72 (Right arm) (!) 152/72 (left arm) 136/80 (left arm)  Pulse: 75    Resp: 16    Temp: 97.6 F (36.4 C)    TempSrc: Oral    SpO2: 99%    Weight: 178 lb (80.7 kg)       Physical Exam   General Appearance:    Alert, cooperative, no distress  Eyes:    PERRL, conjunctiva/corneas clear, EOM's intact       Lungs:     Clear to auscultation bilaterally, respirations unlabored  Heart:    Regular rate and rhythm  Neurologic:   Awake, alert, oriented x 3. No apparent focal neurological           defect.       Results for orders placed or performed in visit on 12/31/16  POCT HgB A1C  Result Value Ref Range   Hemoglobin A1C 7.0    Est. 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  Continue current medications.   - POCT HgB A1C - POCT UA - Microalbumin  2. Essential hypertension He requests change to separate medications in place of losartan-hctz due to insurance.  - losartan (COZAAR) 50 MG tablet; Take 1 tablet (50 mg total) by mouth daily.  Dispense: 90 tablet; Refill: 3 - hydrochlorothiazide (HYDRODIURIL) 12.5 MG tablet; Take 1 tablet (12.5 mg total) by mouth daily.  Dispense: 90 tablet; Refill: 3  3. Late effects of CVA (cerebrovascular accident) Stable.   4. Hyperlipidemia, unspecified hyperlipidemia type He is tolerating  simvastatin well with no adverse effects.    Return in about 6 months (around 06/30/2017).    The entirety of the information documented in the History of Present Illness, Review of Systems and Physical Exam were personally obtained by me. Portions of this information were initially documented by Meyer Cory, CMA and reviewed by me for thoroughness and accuracy.        Lelon Huh, MD  [facility]

## 2016-12-31 ENCOUNTER — Encounter: Payer: Self-pay | Admitting: Family Medicine

## 2016-12-31 ENCOUNTER — Ambulatory Visit (INDEPENDENT_AMBULATORY_CARE_PROVIDER_SITE_OTHER): Payer: 59 | Admitting: Family Medicine

## 2016-12-31 VITALS — BP 136/80 | HR 75 | Temp 97.6°F | Resp 16 | Wt 178.0 lb

## 2016-12-31 DIAGNOSIS — I6990 Unspecified sequelae of unspecified cerebrovascular disease: Secondary | ICD-10-CM | POA: Diagnosis not present

## 2016-12-31 DIAGNOSIS — I10 Essential (primary) hypertension: Secondary | ICD-10-CM

## 2016-12-31 DIAGNOSIS — E1121 Type 2 diabetes mellitus with diabetic nephropathy: Secondary | ICD-10-CM | POA: Diagnosis not present

## 2016-12-31 DIAGNOSIS — E785 Hyperlipidemia, unspecified: Secondary | ICD-10-CM

## 2016-12-31 LAB — POCT UA - MICROALBUMIN: Microalbumin Ur, POC: 50 mg/L

## 2016-12-31 LAB — POCT GLYCOSYLATED HEMOGLOBIN (HGB A1C)
Est. average glucose Bld gHb Est-mCnc: 154
Hemoglobin A1C: 7

## 2016-12-31 MED ORDER — HYDROCHLOROTHIAZIDE 12.5 MG PO TABS: 12.5000 mg | ORAL_TABLET | Freq: Every day | ORAL | 3 refills | Status: DC

## 2016-12-31 MED ORDER — LOSARTAN POTASSIUM 50 MG PO TABS: 50.0000 mg | ORAL_TABLET | Freq: Every day | ORAL | 3 refills | Status: DC

## 2017-01-15 ENCOUNTER — Other Ambulatory Visit: Payer: Self-pay | Admitting: Pharmacist

## 2017-01-15 NOTE — Patient Outreach (Signed)
Communicated with Bobby Thomas via email on 01-01-17 regarding her husband [name] (per patient's request). He has had a stroke and his difficulty with communication.  I discussed the transition of the Link To Wellness program to [facility] or [employer]. He does not have a "smart" phone, so he will need to enroll into [employer] program. Bobby Thomas stated that she completed the health assessments online for both of them and is familiar with the [email or web address] website. Bobby Thomas was last seen by his primary care provider on 12-31-16, next appointment scheduled for 07-01-17.  A1c is stable: 7.0% on 12-31-16 and 6.9% on 06-25-16.  THN CM Care Plan Problem One     Most Recent Value  Care Plan Problem One  At risk of elevated A1c  Role Documenting the Problem One  Clinical Pharmacist  Care Plan for Problem One  Not Active  THN Long Term Goal   Over the next 90 days, patient will maintain A1c < 7%,  evidenced by patient report or lab value  [facility] Long Term Goal Start Date  10/09/16  [facility] Long Term Goal Met Date  12/31/16 [A1c = 7%]  Interventions for Problem One Long Term Goal  Patient encouraged to continue exercising 5-6 days per week, 60 minutes per session. Encouraged to take all medications as directed.      PLAN: Patient will enroll into the [employer] for the employer sponsored diabetes wellness benefits.  Jazilyn Siegenthaler K. [name], PharmD [place] [facility] [phone]

## 2017-02-17 ENCOUNTER — Other Ambulatory Visit: Payer: Self-pay | Admitting: Family Medicine

## 2017-03-17 ENCOUNTER — Other Ambulatory Visit: Payer: Self-pay | Admitting: Family Medicine

## 2017-03-17 DIAGNOSIS — I779 Disorder of arteries and arterioles, unspecified: Secondary | ICD-10-CM

## 2017-03-17 DIAGNOSIS — I739 Peripheral vascular disease, unspecified: Principal | ICD-10-CM

## 2017-03-26 DIAGNOSIS — G4733 Obstructive sleep apnea (adult) (pediatric): Secondary | ICD-10-CM | POA: Diagnosis not present

## 2017-04-09 ENCOUNTER — Ambulatory Visit: Payer: Medicare Other | Admitting: Pharmacist

## 2017-04-14 ENCOUNTER — Other Ambulatory Visit: Payer: Self-pay | Admitting: Family Medicine

## 2017-05-09 ENCOUNTER — Telehealth: Payer: Self-pay

## 2017-05-09 NOTE — Telephone Encounter (Signed)
LMTCB to see about scheduling pts AWV and CPE. Pt already has a f/u scheduled for 5/28. If pt is willing, we can change that to a CPE and schedule the AWV in addition. -MM

## 2017-05-14 NOTE — Telephone Encounter (Signed)
Pt scheduled his AWV for 07/02/17 @ 9 AM.  -MM

## 2017-05-22 DIAGNOSIS — D485 Neoplasm of uncertain behavior of skin: Secondary | ICD-10-CM | POA: Diagnosis not present

## 2017-05-22 DIAGNOSIS — Z85828 Personal history of other malignant neoplasm of skin: Secondary | ICD-10-CM | POA: Diagnosis not present

## 2017-05-22 DIAGNOSIS — D1801 Hemangioma of skin and subcutaneous tissue: Secondary | ICD-10-CM | POA: Diagnosis not present

## 2017-05-22 DIAGNOSIS — L578 Other skin changes due to chronic exposure to nonionizing radiation: Secondary | ICD-10-CM | POA: Diagnosis not present

## 2017-05-22 DIAGNOSIS — L820 Inflamed seborrheic keratosis: Secondary | ICD-10-CM | POA: Diagnosis not present

## 2017-05-22 DIAGNOSIS — L905 Scar conditions and fibrosis of skin: Secondary | ICD-10-CM | POA: Diagnosis not present

## 2017-05-22 DIAGNOSIS — L570 Actinic keratosis: Secondary | ICD-10-CM | POA: Diagnosis not present

## 2017-05-22 DIAGNOSIS — D044 Carcinoma in situ of skin of scalp and neck: Secondary | ICD-10-CM | POA: Diagnosis not present

## 2017-05-22 DIAGNOSIS — Z1283 Encounter for screening for malignant neoplasm of skin: Secondary | ICD-10-CM | POA: Diagnosis not present

## 2017-06-04 DIAGNOSIS — H5203 Hypermetropia, bilateral: Secondary | ICD-10-CM | POA: Diagnosis not present

## 2017-06-04 DIAGNOSIS — E119 Type 2 diabetes mellitus without complications: Secondary | ICD-10-CM | POA: Diagnosis not present

## 2017-06-04 LAB — HM DIABETES EYE EXAM

## 2017-07-01 ENCOUNTER — Ambulatory Visit: Payer: Self-pay | Admitting: Family Medicine

## 2017-07-02 ENCOUNTER — Ambulatory Visit (INDEPENDENT_AMBULATORY_CARE_PROVIDER_SITE_OTHER): Payer: 59

## 2017-07-02 ENCOUNTER — Ambulatory Visit (INDEPENDENT_AMBULATORY_CARE_PROVIDER_SITE_OTHER): Payer: 59 | Admitting: Family Medicine

## 2017-07-02 VITALS — BP 140/66 | HR 60 | Temp 98.3°F | Ht 66.0 in | Wt 176.6 lb

## 2017-07-02 DIAGNOSIS — I10 Essential (primary) hypertension: Secondary | ICD-10-CM | POA: Diagnosis not present

## 2017-07-02 DIAGNOSIS — N183 Chronic kidney disease, stage 3 unspecified: Secondary | ICD-10-CM

## 2017-07-02 DIAGNOSIS — E782 Mixed hyperlipidemia: Secondary | ICD-10-CM

## 2017-07-02 DIAGNOSIS — E785 Hyperlipidemia, unspecified: Secondary | ICD-10-CM

## 2017-07-02 DIAGNOSIS — R221 Localized swelling, mass and lump, neck: Secondary | ICD-10-CM

## 2017-07-02 DIAGNOSIS — E1121 Type 2 diabetes mellitus with diabetic nephropathy: Secondary | ICD-10-CM | POA: Diagnosis not present

## 2017-07-02 DIAGNOSIS — Z0000 Encounter for general adult medical examination without abnormal findings: Secondary | ICD-10-CM

## 2017-07-02 DIAGNOSIS — Z125 Encounter for screening for malignant neoplasm of prostate: Secondary | ICD-10-CM | POA: Diagnosis not present

## 2017-07-02 NOTE — Patient Instructions (Addendum)
Bobby Thomas , Thank you for taking time to come for your Medicare Wellness Visit. I appreciate your ongoing commitment to your health goals. Please review the following plan we discussed and let me know if I can assist you in the future.   Screening recommendations/referrals: Colonoscopy: Up to date Recommended yearly ophthalmology/optometry visit for glaucoma screening and checkup Recommended yearly dental visit for hygiene and checkup  Vaccinations: Influenza vaccine: Up to date Pneumococcal vaccine: Up to date Tdap vaccine: .yutd Shingles vaccine: Pt declines today.     Advanced directives: Please bring a copy of your POA (Power of Attorney) and/or Living Will to your next appointment.   Conditions/risks identified: Recommend increasing water intake to 4-6 glasses a day.   Next appointment: 10:00 AM today with Dr Caryn Section.  Preventive Care 45 Years and Older, Male Preventive care refers to lifestyle choices and visits with your health care provider that can promote health and wellness. What does preventive care include?  A yearly physical exam. This is also called an annual well check.  Dental exams once or twice a year.  Routine eye exams. Ask your health care provider how often you should have your eyes checked.  Personal lifestyle choices, including:  Daily care of your teeth and gums.  Regular physical activity.  Eating a healthy diet.  Avoiding tobacco and drug use.  Limiting alcohol use.  Practicing safe sex.  Taking low doses of aspirin every day.  Taking vitamin and mineral supplements as recommended by your health care provider. What happens during an annual well check? The services and screenings done by your health care provider during your annual well check will depend on your age, overall health, lifestyle risk factors, and family history of disease. Counseling  Your health care provider may ask you questions about your:  Alcohol use.  Tobacco  use.  Drug use.  Emotional well-being.  Home and relationship well-being.  Sexual activity.  Eating habits.  History of falls.  Memory and ability to understand (cognition).  Work and work Statistician. Screening  You may have the following tests or measurements:  Height, weight, and BMI.  Blood pressure.  Lipid and cholesterol levels. These may be checked every 5 years, or more frequently if you are over 94 years old.  Skin check.  Lung cancer screening. You may have this screening every year starting at age 47 if you have a 30-pack-year history of smoking and currently smoke or have quit within the past 15 years.  Fecal occult blood test (FOBT) of the stool. You may have this test every year starting at age 2.  Flexible sigmoidoscopy or colonoscopy. You may have a sigmoidoscopy every 5 years or a colonoscopy every 10 years starting at age 68.  Prostate cancer screening. Recommendations will vary depending on your family history and other risks.  Hepatitis C blood test.  Hepatitis B blood test.  Sexually transmitted disease (STD) testing.  Diabetes screening. This is done by checking your blood sugar (glucose) after you have not eaten for a while (fasting). You may have this done every 1-3 years.  Abdominal aortic aneurysm (AAA) screening. You may need this if you are a current or former smoker.  Osteoporosis. You may be screened starting at age 63 if you are at high risk. Talk with your health care provider about your test results, treatment options, and if necessary, the need for more tests. Vaccines  Your health care provider may recommend certain vaccines, such as:  Influenza vaccine. This  is recommended every year.  Tetanus, diphtheria, and acellular pertussis (Tdap, Td) vaccine. You may need a Td booster every 10 years.  Zoster vaccine. You may need this after age 32.  Pneumococcal 13-valent conjugate (PCV13) vaccine. One dose is recommended after age  58.  Pneumococcal polysaccharide (PPSV23) vaccine. One dose is recommended after age 15. Talk to your health care provider about which screenings and vaccines you need and how often you need them. This information is not intended to replace advice given to you by your health care provider. Make sure you discuss any questions you have with your health care provider. Document Released: 02/17/2015 Document Revised: 10/11/2015 Document Reviewed: 11/22/2014 Elsevier Interactive Patient Education  2017 [place] Prevention in the Home Falls can cause injuries. They can happen to people of all ages. There are many things you can do to make your home safe and to help prevent falls. What can I do on the outside of my home?  Regularly fix the edges of walkways and driveways and fix any cracks.  Remove anything that might make you trip as you walk through a door, such as a raised step or threshold.  Trim any bushes or trees on the path to your home.  Use bright outdoor lighting.  Clear any walking paths of anything that might make someone trip, such as rocks or tools.  Regularly check to see if handrails are loose or broken. Make sure that both sides of any steps have handrails.  Any raised decks and porches should have guardrails on the edges.  Have any leaves, snow, or ice cleared regularly.  Use sand or salt on walking paths during winter.  Clean up any spills in your garage right away. This includes oil or grease spills. What can I do in the bathroom?  Use night lights.  Install grab bars by the toilet and in the tub and shower. Do not use towel bars as grab bars.  Use non-skid mats or decals in the tub or shower.  If you need to sit down in the shower, use a plastic, non-slip stool.  Keep the floor dry. Clean up any water that spills on the floor as soon as it happens.  Remove soap buildup in the tub or shower regularly.  Attach bath mats securely with double-sided  non-slip rug tape.  Do not have throw rugs and other things on the floor that can make you trip. What can I do in the bedroom?  Use night lights.  Make sure that you have a light by your bed that is easy to reach.  Do not use any sheets or blankets that are too big for your bed. They should not hang down onto the floor.  Have a firm chair that has side arms. You can use this for support while you get dressed.  Do not have throw rugs and other things on the floor that can make you trip. What can I do in the kitchen?  Clean up any spills right away.  Avoid walking on wet floors.  Keep items that you use a lot in easy-to-reach places.  If you need to reach something above you, use a strong step stool that has a grab bar.  Keep electrical cords out of the way.  Do not use floor polish or wax that makes floors slippery. If you must use wax, use non-skid floor wax.  Do not have throw rugs and other things on the floor that can make you  trip. What can I do with my stairs?  Do not leave any items on the stairs.  Make sure that there are handrails on both sides of the stairs and use them. Fix handrails that are broken or loose. Make sure that handrails are as long as the stairways.  Check any carpeting to make sure that it is firmly attached to the stairs. Fix any carpet that is loose or worn.  Avoid having throw rugs at the top or bottom of the stairs. If you do have throw rugs, attach them to the floor with carpet tape.  Make sure that you have a light switch at the top of the stairs and the bottom of the stairs. If you do not have them, ask someone to add them for you. What else can I do to help prevent falls?  Wear shoes that:  Do not have high heels.  Have rubber bottoms.  Are comfortable and fit you well.  Are closed at the toe. Do not wear sandals.  If you use a stepladder:  Make sure that it is fully opened. Do not climb a closed stepladder.  Make sure that both  sides of the stepladder are locked into place.  Ask someone to hold it for you, if possible.  Clearly mark and make sure that you can see:  Any grab bars or handrails.  First and last steps.  Where the edge of each step is.  Use tools that help you move around (mobility aids) if they are needed. These include:  Canes.  Walkers.  Scooters.  Crutches.  Turn on the lights when you go into a dark area. Replace any light bulbs as soon as they burn out.  Set up your furniture so you have a clear path. Avoid moving your furniture around.  If any of your floors are uneven, fix them.  If there are any pets around you, be aware of where they are.  Review your medicines with your doctor. Some medicines can make you feel dizzy. This can increase your chance of falling. Ask your doctor what other things that you can do to help prevent falls. This information is not intended to replace advice given to you by your health care provider. Make sure you discuss any questions you have with your health care provider. Document Released: 11/17/2008 Document Revised: 06/29/2015 Document Reviewed: 02/25/2014 Elsevier Interactive Patient Education  2017 [employer].
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
## 2017-07-02 NOTE — Progress Notes (Deleted)
[name] Male    DOB: 21-Mar-1940   77 y.o.   MRN: [id number] Visit Date: 07/02/2017  Today's Provider: Lelon Huh, MD   Chief Complaint  Patient presents with  . Annual Exam  . Hypertension  . Diabetes  . Hyperlipidemia   Subjective:   Patient saw [facility]enzie for AWV today at 9:00 am.  HPI   Diabetes Mellitus Type II, Follow-up:   Lab Results  Component Value Date   HGBA1C 7.0 12/31/2016   HGBA1C 6.9 06/25/2016   HGBA1C 6.8 12/25/2015   Last seen for diabetes 6 months ago.  Management since then includes; no changes. He reports {excellent/good/fair/poor:19665} compliance with treatment. He {ACTION; IS/IS CWC:[id number]} having side effects. *** Current symptoms include {Symptoms; diabetes:14075} and have been {Desc; course:15616}. Home blood sugar records: {diabetes glucometry results:16657}  Episodes of hypoglycemia? {yes***/no:17258}   Current Insulin Regimen: *** Most Recent Eye Exam: *** Weight trend: {trend:16658} Prior visit with dietician: {yes/no:17258} Current diet: {diet habits:16563} Current exercise: {exercise types:16438}  ------------------------------------------------------------------------   Hypertension, follow-up:  BP Readings from Last 3 Encounters:  07/02/17 140/66  12/31/16 136/80  10/09/16 122/70    He was last seen for hypertension 6 months ago.  BP at that visit was 138/72. Management since that visit includes; changed losartan-HCTZ to separate medications due to insurance.He reports {excellent/good/fair/poor:19665} compliance with treatment. He {ACTION; IS/IS VVO:16073710} having side effects. *** He {is/is not:9024} exercising. He {is/is not:9024} adherent to low salt diet.   Outside blood pressures are ***. He is experiencing {Symptoms; cardiac:12860}.  Patient denies {Symptoms; cardiac:12860}.   Cardiovascular risk factors include {cv risk factors:510}.  Use of agents associated with hypertension: {bp  agents assoc with hypertension:511::"none"}.   ------------------------------------------------------------------------    Lipid/Cholesterol, Follow-up:   Last seen for this 6 months ago.  Management since that visit includes; no changes.  Last Lipid Panel:    Component Value Date/Time   CHOL 140 06/25/2016 0853   CHOL 131 05/20/2012 0940   TRIG 160 (H) 06/25/2016 0853   TRIG 154 05/20/2012 0940   HDL 46 06/25/2016 0853   HDL 44 05/20/2012 0940   CHOLHDL 3.0 06/25/2016 0853   VLDL 31 05/20/2012 0940   LDLCALC 62 06/25/2016 0853   LDLCALC 56 05/20/2012 0940    He reports {excellent/good/fair/poor:19665} compliance with treatment. He {ACTION; IS/IS GYI:[id number]} having side effects. ***  Wt Readings from Last 3 Encounters:  07/02/17 176 lb 9.6 oz (80.1 kg)  12/31/16 178 lb (80.7 kg)  10/09/16 170 lb (77.1 kg)    ------------------------------------------------------------------------    Allergies  Allergen Reactions  . Hydrocodone-Guaifenesin Shortness Of Breath and Other (See Comments)    Not able to sit still when using Codiclear DH  . Bee Venom Other (See Comments)  . Codeine     Other reaction(s): Pruritus  . Morphine Sulfate     Other reaction(s): Pruritus  . Warfarin Sodium Diarrhea     Current Outpatient Medications:  .  aspirin 81 MG tablet, Take 1 tablet by mouth daily., Disp: , Rfl:  .  Chlorphen-Pseudoephed-APAP (TYLENOL ALLERGY COMPLETE PO), Take by mouth as needed., Disp: , Rfl:  .  clopidogrel (PLAVIX) 75 MG tablet, TAKE 1 TABLET BY MOUTH DAILY, Disp: 30 tablet, Rfl: 12 .  FREESTYLE LITE test strip, USE AS DIRECTED DAILY, Disp: 100 each, Rfl: 5 .  hydrochlorothiazide (HYDRODIURIL) 12.5 MG tablet, Take 1 tablet (12.5 mg total) by mouth daily., Disp: 90 tablet, Rfl: 3 .  Lancets (FREESTYLE)  lancets, USE AS DIRECTED DAILY, Disp: 100 each, Rfl: 4 .  LORazepam (ATIVAN) 1 MG tablet, TAKE 1/2 TO 1 TABLET BY MOUTH NIGHTLY AT BEDTIME, Disp: 90 tablet,  Rfl: 4 .  losartan (COZAAR) 50 MG tablet, Take 1 tablet (50 mg total) by mouth daily., Disp: 90 tablet, Rfl: 3 .  metFORMIN (GLUCOPHAGE) 850 MG tablet, TAKE 1 TABLET BY MOUTH 2 TIMES DAILY WITH A MEAL., Disp: 60 tablet, Rfl: 11 .  metoprolol tartrate (LOPRESSOR) 25 MG tablet, TAKE 1 TABLET BY MOUTH TWICE DAILY, Disp: 60 tablet, Rfl: 11 .  simvastatin (ZOCOR) 20 MG tablet, TAKE 1 TABLET (20 MG TOTAL) BY MOUTH DAILY., Disp: 90 tablet, Rfl: 4  Review of Systems  Constitutional: Negative for appetite change, chills, fatigue and fever.  HENT: Negative for congestion, ear pain, hearing loss, nosebleeds and trouble swallowing.   Eyes: Negative for pain and visual disturbance.  Respiratory: Negative for cough, chest tightness and shortness of breath.   Cardiovascular: Negative for chest pain, palpitations and leg swelling.  Gastrointestinal: Negative for abdominal pain, blood in stool, constipation, diarrhea, nausea and vomiting.  Endocrine: Negative for polydipsia, polyphagia and polyuria.  Genitourinary: Negative for dysuria and flank pain.  Musculoskeletal: Negative for arthralgias, back pain, joint swelling, myalgias and neck stiffness.  Skin: Negative for color change, rash and wound.  Neurological: Negative for dizziness, tremors, seizures, speech difficulty, weakness, light-headedness and headaches.  Psychiatric/Behavioral: Negative for behavioral problems, confusion, decreased concentration, dysphoric mood and sleep disturbance. The patient is not nervous/anxious.   All other systems reviewed and are negative.   Social History   Tobacco Use  . Smoking status: Former Smoker    Packs/day: 0.51    Types: Cigarettes    Last attempt to quit: 08/23/2013    Years since quitting: 3.8  . Smokeless tobacco: Never Used  Substance Use Topics  . Alcohol use: No    Alcohol/week: 0.0 oz   Objective:   There were no vitals taken for this visit. There were no vitals filed for this visit. Vitals:    BP 140/66 (BP Location: Right Arm)    Pulse 60    Temp 98.3 F (36.8 C) (Oral)    Ht 5\' 6"  (1.676 m)    Wt 176 lb 9.6 oz (80.1 kg)    BMI 28.50 kg/m    BSA 1.93 m           Physical Exam      Assessment & Plan:           Lelon Huh, MD  [facility]

## 2017-07-03 DIAGNOSIS — I10 Essential (primary) hypertension: Secondary | ICD-10-CM | POA: Diagnosis not present

## 2017-07-03 DIAGNOSIS — E1121 Type 2 diabetes mellitus with diabetic nephropathy: Secondary | ICD-10-CM | POA: Diagnosis not present

## 2017-07-03 DIAGNOSIS — E785 Hyperlipidemia, unspecified: Secondary | ICD-10-CM | POA: Diagnosis not present

## 2017-07-03 DIAGNOSIS — Z125 Encounter for screening for malignant neoplasm of prostate: Secondary | ICD-10-CM | POA: Diagnosis not present

## 2017-07-04 LAB — COMPREHENSIVE METABOLIC PANEL
A/G RATIO: 1.6 (ref 1.2–2.2)
ALT: 15 IU/L (ref 0–44)
AST: 16 IU/L (ref 0–40)
Albumin: 4.7 g/dL (ref 3.5–4.8)
Alkaline Phosphatase: 54 IU/L (ref 39–117)
BILIRUBIN TOTAL: 1.1 mg/dL (ref 0.0–1.2)
BUN/Creatinine Ratio: 25 — ABNORMAL HIGH (ref 10–24)
BUN: 41 mg/dL — AB (ref 8–27)
CALCIUM: 10.6 mg/dL — AB (ref 8.6–10.2)
CHLORIDE: 98 mmol/L (ref 96–106)
CO2: 24 mmol/L (ref 20–29)
Creatinine, Ser: 1.64 mg/dL — ABNORMAL HIGH (ref 0.76–1.27)
GFR calc non Af Amer: 41 mL/min/{1.73_m2} — ABNORMAL LOW (ref >59)
GFR, EST AFRICAN AMERICAN: 47 mL/min/{1.73_m2} — AB (ref >59)
GLUCOSE: 146 mg/dL — AB (ref 65–99)
Globulin, Total: 3 g/dL (ref 1.5–4.5)
POTASSIUM: 4.8 mmol/L (ref 3.5–5.2)
Sodium: 138 mmol/L (ref 134–144)
TOTAL PROTEIN: 7.7 g/dL (ref 6.0–8.5)

## 2017-07-04 LAB — LIPID PANEL
Chol/HDL Ratio: 3.2 ratio (ref 0.0–5.0)
Cholesterol, Total: 142 mg/dL (ref 100–199)
HDL: 44 mg/dL (ref >39)
LDL Calculated: 63 mg/dL (ref 0–99)
TRIGLYCERIDES: 176 mg/dL — AB (ref 0–149)
VLDL CHOLESTEROL CAL: 35 mg/dL (ref 5–40)

## 2017-07-04 LAB — HEMOGLOBIN A1C
Est. average glucose Bld gHb Est-mCnc: 163 mg/dL
Hgb A1c MFr Bld: 7.3 % — ABNORMAL HIGH (ref 4.8–5.6)

## 2017-07-04 LAB — PSA: PROSTATE SPECIFIC AG, SERUM: 0.3 ng/mL (ref 0.0–4.0)

## 2017-07-08 ENCOUNTER — Telehealth: Payer: Self-pay | Admitting: *Deleted

## 2017-07-08 ENCOUNTER — Ambulatory Visit: Payer: 59

## 2017-07-08 NOTE — Telephone Encounter (Signed)
LMOVM for pt to return call 

## 2017-07-08 NOTE — Telephone Encounter (Signed)
Pt returned call ° °teri °

## 2017-07-08 NOTE — Telephone Encounter (Signed)
Patient was notified of results. Expressed understanding.  

## 2017-07-08 NOTE — Telephone Encounter (Signed)
Tried calling patient. Left message to call back. 

## 2017-07-08 NOTE — Telephone Encounter (Signed)
Pt returned call again  [facility]

## 2017-07-08 NOTE — Telephone Encounter (Signed)
-----   Message from Birdie Sons, MD sent at 07/08/2017  8:05 AM EDT ----- PSA, blood sugar, kidney functions, electrolytes and cholesterol are all normal. Check labs yearly. a1c is stable at 7.3%. Continue current medications.  Schedule follow up for diabetes 5-6 months.

## 2017-07-09 ENCOUNTER — Ambulatory Visit: Payer: 59

## 2017-07-15 ENCOUNTER — Ambulatory Visit
Admission: RE | Admit: 2017-07-15 | Discharge: 2017-07-15 | Disposition: A | Discharge status: Home / Self Care | Payer: 59 | Source: Ambulatory Visit | Attending: Family Medicine | Admitting: Family Medicine

## 2017-07-15 DIAGNOSIS — R221 Localized swelling, mass and lump, neck: Secondary | ICD-10-CM | POA: Diagnosis not present

## 2017-07-16 ENCOUNTER — Telehealth: Payer: Self-pay

## 2017-07-16 NOTE — Telephone Encounter (Signed)
Patient's wife Barbaraann Share (on [place]) advised. She states she will discuss results with patient tonight. She will call back to let us know if patient would like to proceed with CT scan tomorrow.

## 2017-07-16 NOTE — Telephone Encounter (Signed)
-----   Message from Birdie Sons, MD sent at 07/15/2017 12:23 PM EDT ----- Ultrasound shows several lymph nodes in neck. Needs chest CT with contrast to better evaluate lymph nodes and to see if there are in others any chest.

## 2017-07-18 ENCOUNTER — Encounter: Payer: Self-pay | Admitting: Family Medicine

## 2017-07-18 NOTE — Telephone Encounter (Signed)
Per patient email patient wants to hold off on doing anymore testing at this time.

## 2017-08-08 IMAGING — DX DG ABD PORTABLE 2V
3 series · 3 of 3 positions shown · non-contrast
Comparison: Portable exam 0737 hours compared to CT abdomen and
pelvis 12/09/2015

CLINICAL DATA: Small bowel obstruction

EXAM:
PORTABLE ABDOMEN - 2 VIEW

[abdomen erect]
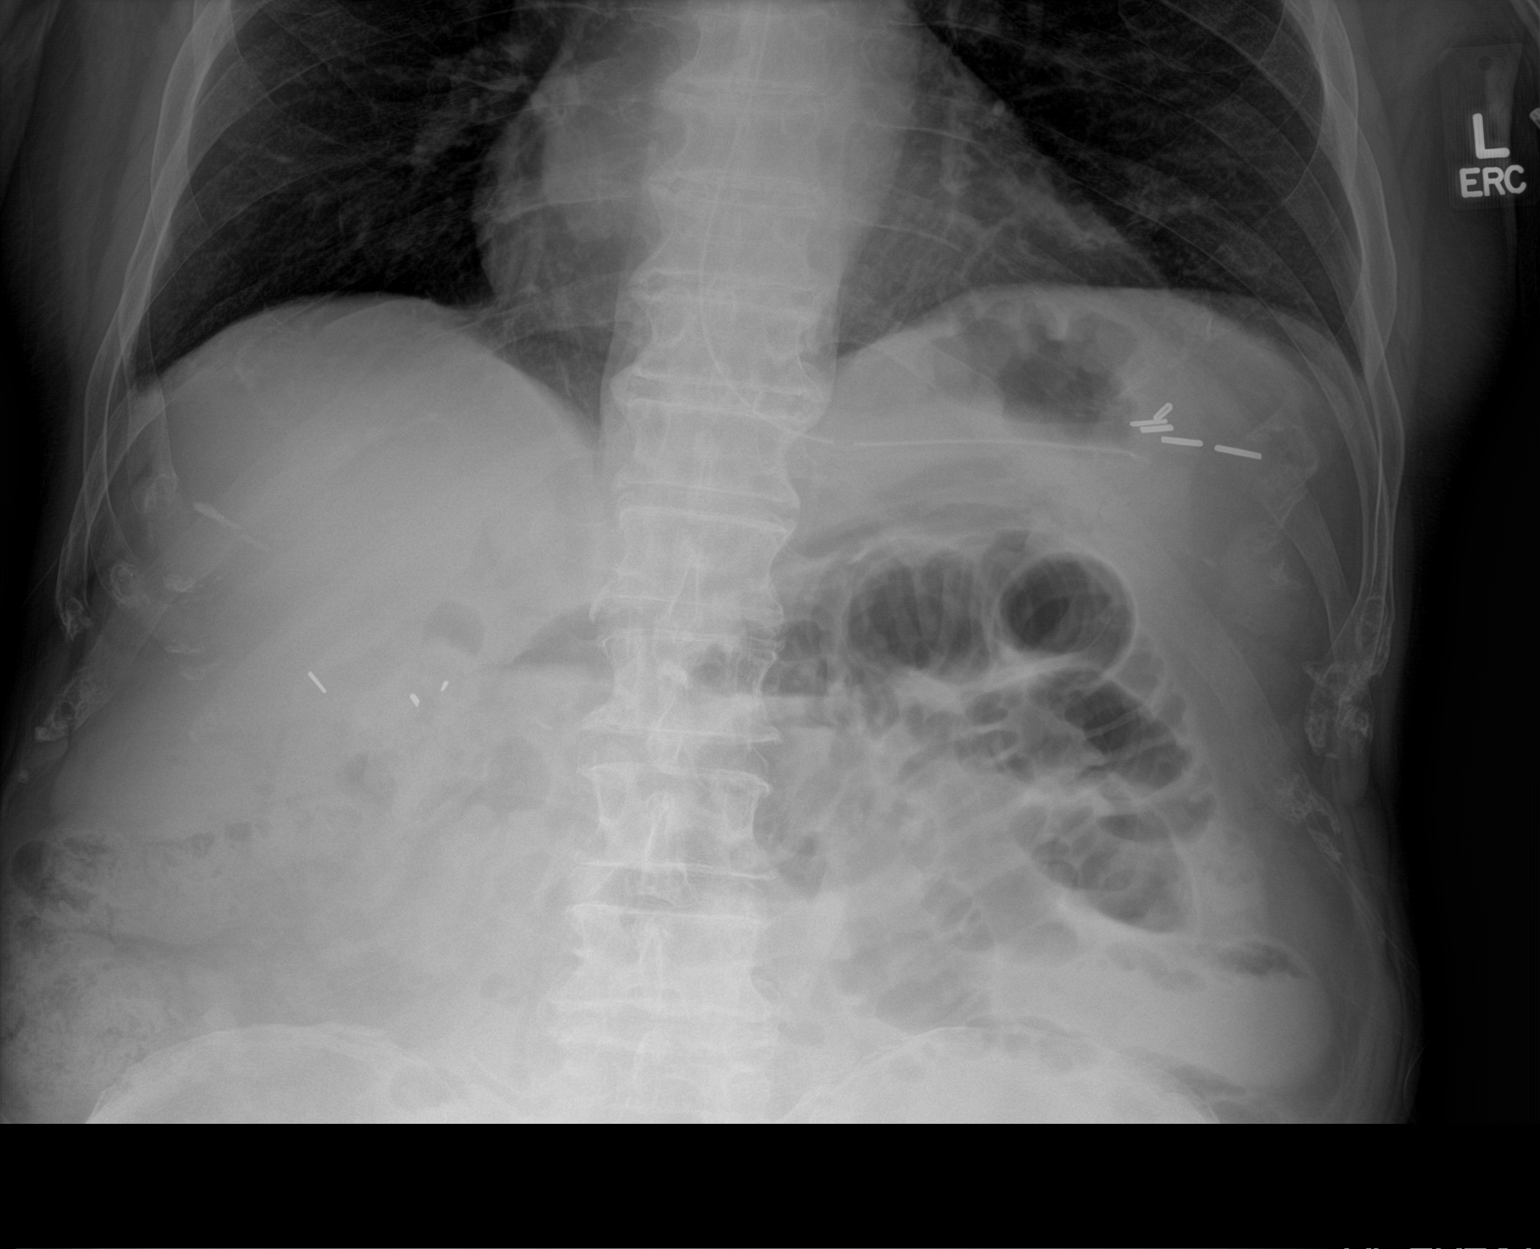

[abdomen supine (1 of 2)]
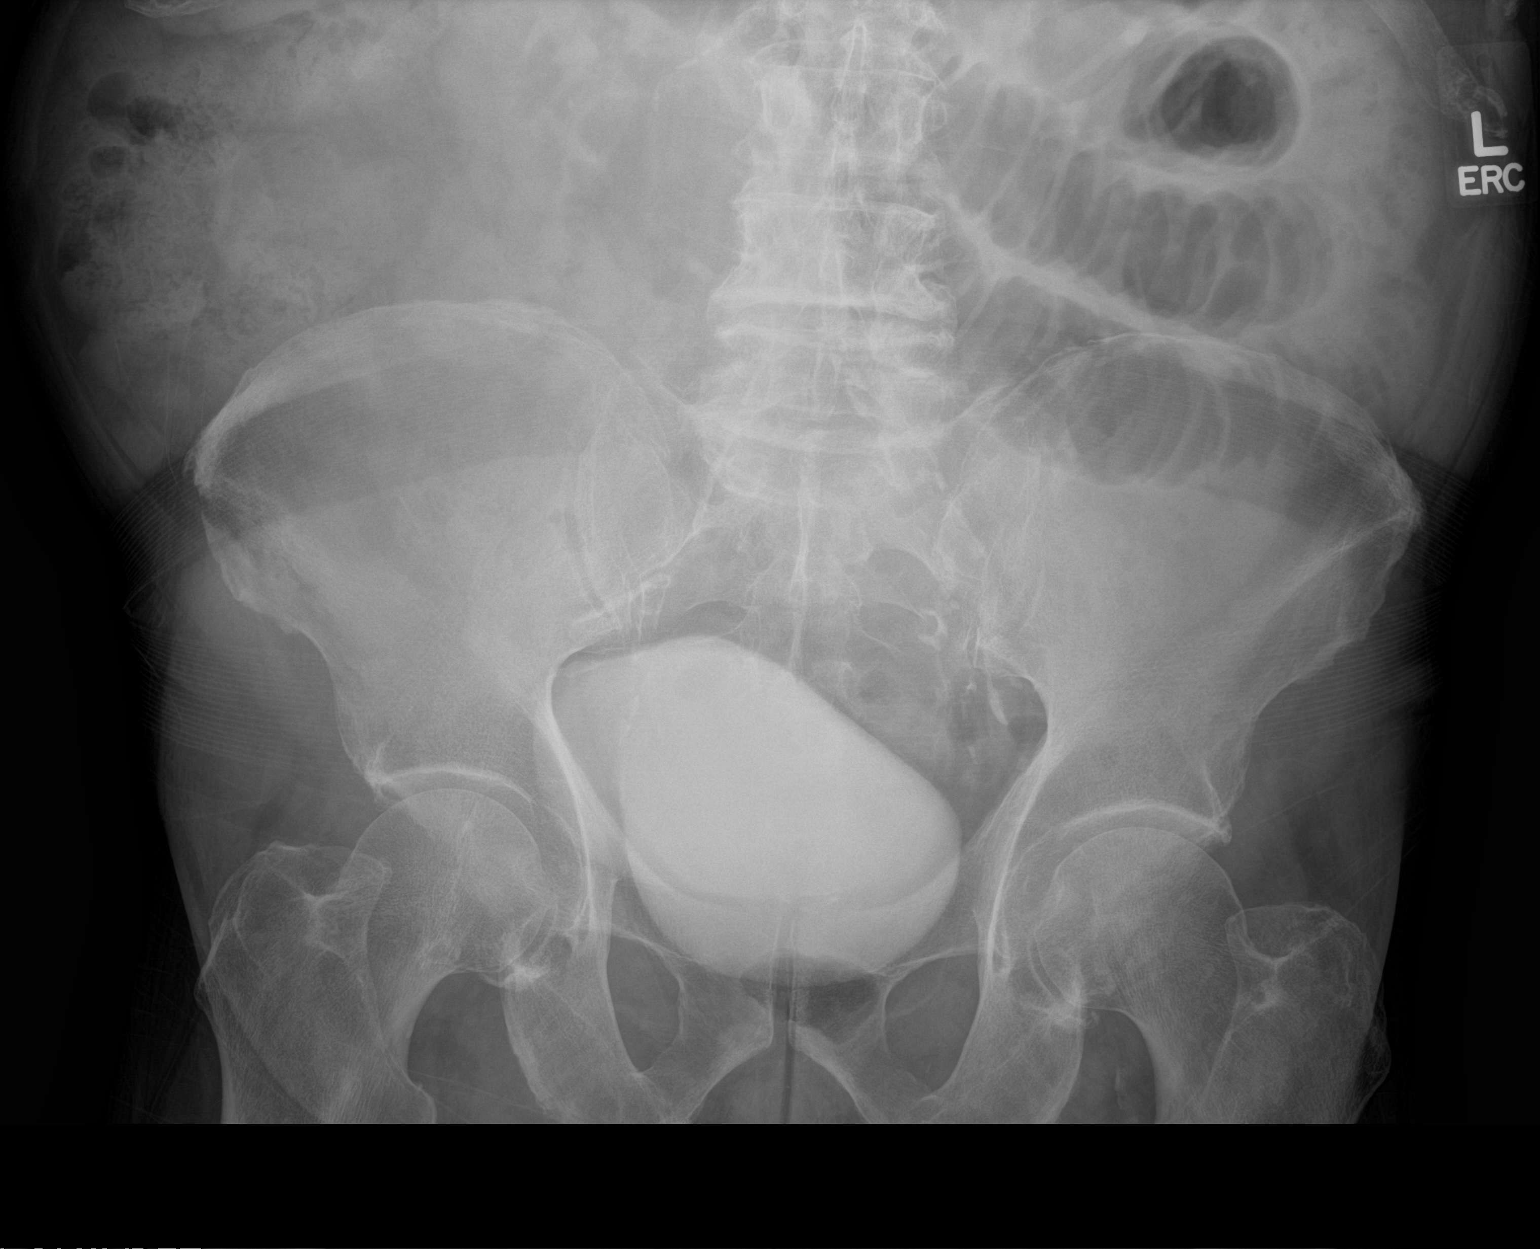

[abdomen supine (2 of 2)]
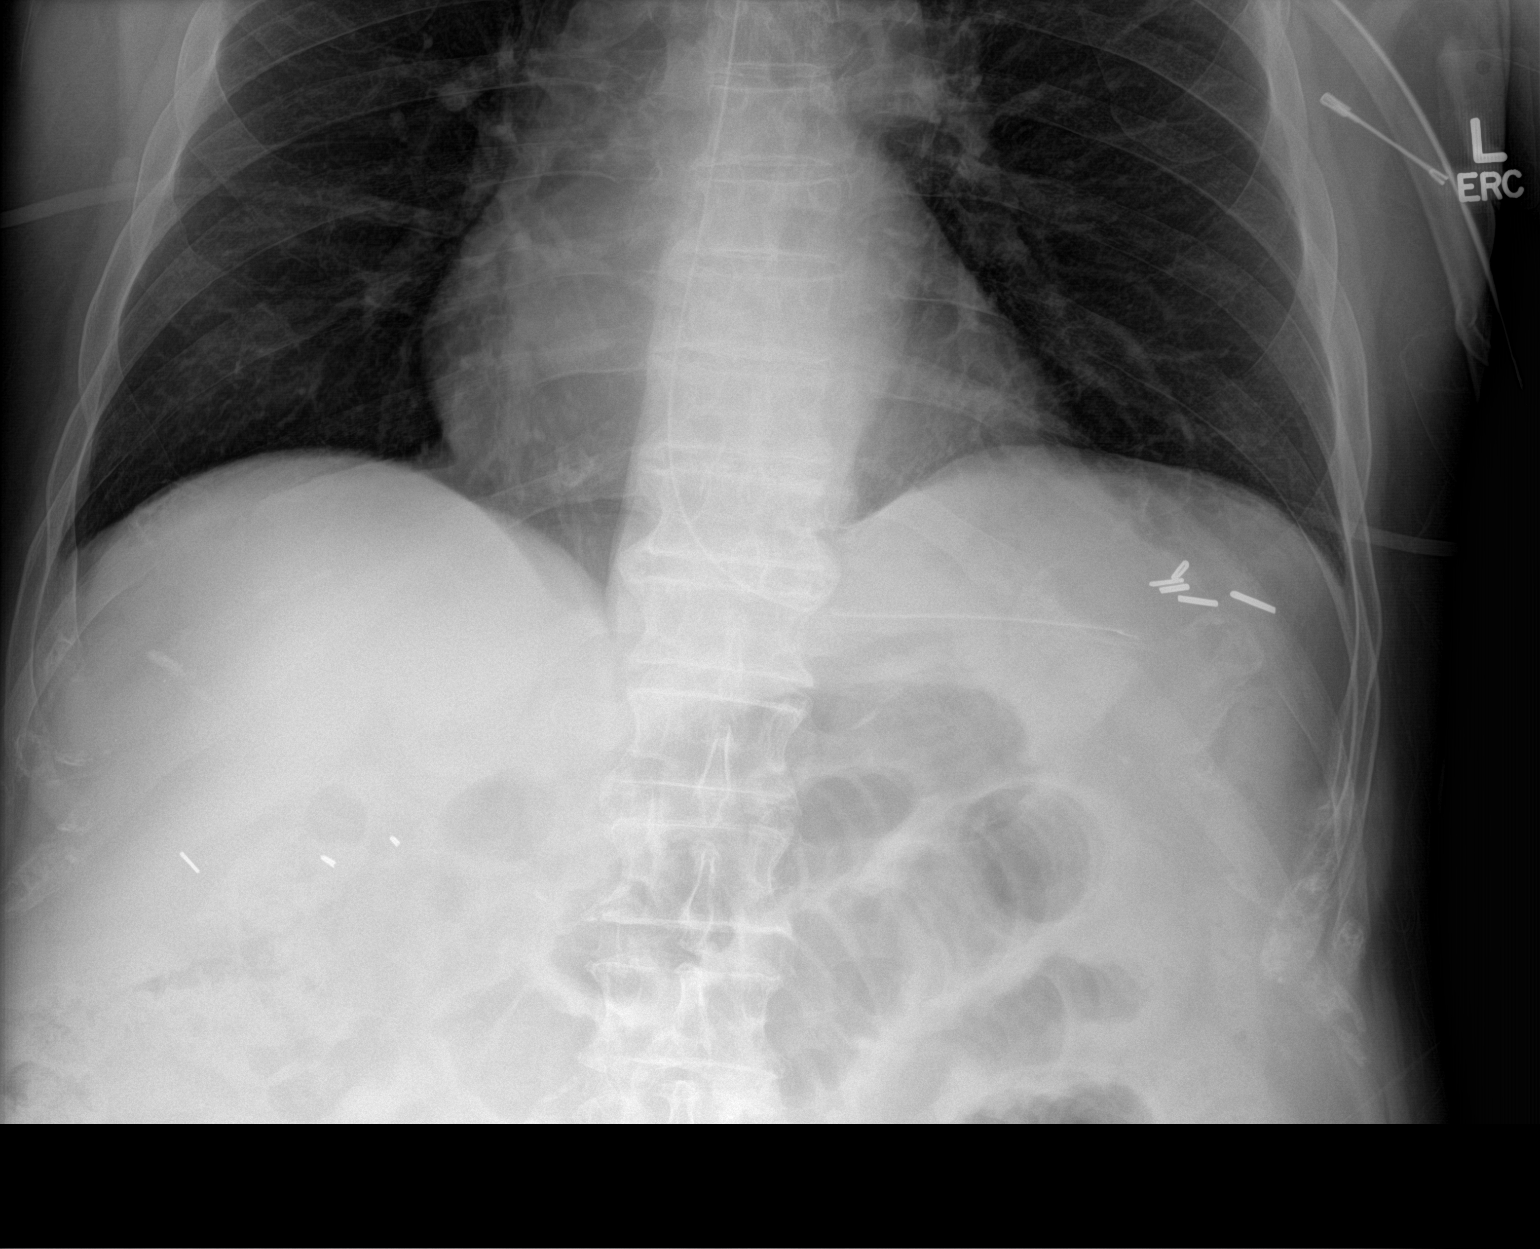

[3 of 3 positions shown; findings below may reference images not displayed]

FINDINGS: Lung bases clear.

Nasogastric tube projects over proximal stomach.

Persistent air-filled dilated loops of small bowel consistent with
small bowel obstruction.

Small amount gas and stool within RIGHT colon.

Excreted contrast material within urinary bladder.

No bowel wall thickening or free intraperitoneal air.

Diffuse osseous demineralization.

Atherosclerotic calcifications aorta and iliac arteries.
IMPRESSION: Persistent small bowel obstruction.

Aortic atherosclerosis.

## 2017-08-18 ENCOUNTER — Other Ambulatory Visit: Payer: Self-pay | Admitting: Family Medicine

## 2017-08-28 ENCOUNTER — Other Ambulatory Visit: Payer: Self-pay

## 2017-08-28 DIAGNOSIS — T782XXA Anaphylactic shock, unspecified, initial encounter: Secondary | ICD-10-CM

## 2017-08-28 MED ORDER — EPINEPHRINE 0.3 MG/0.3ML IJ SOAJ: 0.3000 mg | Freq: Once | INTRAMUSCULAR | 5 refills | Status: AC

## 2017-08-28 NOTE — Telephone Encounter (Signed)
Please advise 

## 2017-08-28 NOTE — Telephone Encounter (Signed)
Sent in

## 2017-08-28 NOTE — Telephone Encounter (Signed)
[name] is requesting a refill on Epi Pen be sent to [place]. Patient got stung by a bee today. They used a Epi-Pen that was out of date. Patient is fine at this time by wife.

## 2017-08-28 NOTE — Telephone Encounter (Signed)
Patient's wife was notified.  

## 2017-10-15 ENCOUNTER — Telehealth: Payer: Self-pay | Admitting: Family Medicine

## 2017-10-15 NOTE — Telephone Encounter (Signed)
Pt's wife called saying he needs a note for jury summons.  It will be in Oct but he is coming in this Saturday for a flu vaccine and she would like to pick the note up when they come.  BC#  [phone]  Thanks  Con Memos

## 2017-10-15 NOTE — Telephone Encounter (Signed)
Please advise 

## 2017-10-16 NOTE — Telephone Encounter (Signed)
done

## 2017-10-18 ENCOUNTER — Ambulatory Visit (INDEPENDENT_AMBULATORY_CARE_PROVIDER_SITE_OTHER): Payer: Medicare Other

## 2017-10-18 DIAGNOSIS — Z23 Encounter for immunization: Secondary | ICD-10-CM

## 2017-11-27 DIAGNOSIS — L570 Actinic keratosis: Secondary | ICD-10-CM | POA: Diagnosis not present

## 2017-11-27 DIAGNOSIS — D485 Neoplasm of uncertain behavior of skin: Secondary | ICD-10-CM | POA: Diagnosis not present

## 2017-11-27 DIAGNOSIS — L820 Inflamed seborrheic keratosis: Secondary | ICD-10-CM | POA: Diagnosis not present

## 2017-11-27 DIAGNOSIS — Z85828 Personal history of other malignant neoplasm of skin: Secondary | ICD-10-CM | POA: Diagnosis not present

## 2017-11-27 DIAGNOSIS — Z1283 Encounter for screening for malignant neoplasm of skin: Secondary | ICD-10-CM | POA: Diagnosis not present

## 2017-11-27 DIAGNOSIS — L720 Epidermal cyst: Secondary | ICD-10-CM | POA: Diagnosis not present

## 2017-11-27 DIAGNOSIS — L578 Other skin changes due to chronic exposure to nonionizing radiation: Secondary | ICD-10-CM | POA: Diagnosis not present

## 2017-11-27 DIAGNOSIS — L821 Other seborrheic keratosis: Secondary | ICD-10-CM | POA: Diagnosis not present

## 2017-11-27 DIAGNOSIS — D1800 Hemangioma unspecified site: Secondary | ICD-10-CM | POA: Diagnosis not present

## 2017-12-08 ENCOUNTER — Encounter: Payer: Self-pay | Admitting: Family Medicine

## 2017-12-08 NOTE — Telephone Encounter (Signed)
ERROR

## 2017-12-15 ENCOUNTER — Other Ambulatory Visit: Payer: Self-pay | Admitting: Family Medicine

## 2018-01-07 ENCOUNTER — Encounter: Payer: Self-pay | Admitting: Family Medicine

## 2018-01-07 ENCOUNTER — Ambulatory Visit (INDEPENDENT_AMBULATORY_CARE_PROVIDER_SITE_OTHER): Payer: 59 | Admitting: Family Medicine

## 2018-01-07 VITALS — BP 136/70 | HR 64 | Temp 97.9°F | Resp 16 | Wt 177.0 lb

## 2018-01-07 DIAGNOSIS — I4811 Longstanding persistent atrial fibrillation: Secondary | ICD-10-CM

## 2018-01-07 DIAGNOSIS — E1121 Type 2 diabetes mellitus with diabetic nephropathy: Secondary | ICD-10-CM | POA: Diagnosis not present

## 2018-01-07 DIAGNOSIS — L570 Actinic keratosis: Secondary | ICD-10-CM | POA: Diagnosis not present

## 2018-01-07 DIAGNOSIS — R221 Localized swelling, mass and lump, neck: Secondary | ICD-10-CM | POA: Diagnosis not present

## 2018-01-07 DIAGNOSIS — I10 Essential (primary) hypertension: Secondary | ICD-10-CM | POA: Diagnosis not present

## 2018-01-07 DIAGNOSIS — I779 Disorder of arteries and arterioles, unspecified: Secondary | ICD-10-CM

## 2018-01-07 DIAGNOSIS — E785 Hyperlipidemia, unspecified: Secondary | ICD-10-CM | POA: Diagnosis not present

## 2018-01-07 DIAGNOSIS — I739 Peripheral vascular disease, unspecified: Secondary | ICD-10-CM | POA: Diagnosis not present

## 2018-01-07 LAB — POCT GLYCOSYLATED HEMOGLOBIN (HGB A1C): Hemoglobin A1C: 7.4 % — AB (ref 4.0–5.6)

## 2018-01-07 MED ORDER — RIVAROXABAN 15 MG PO TABS: 15.0000 mg | ORAL_TABLET | Freq: Every day | ORAL | 12 refills | Status: DC

## 2018-01-07 NOTE — Progress Notes (Signed)
[name] Male    DOB: 22-May-1940   77 y.o.   MRN: [id number] Visit Date: 01/07/2018  Today's Provider: Lelon Huh, MD   Chief Complaint  Patient presents with  . Hypertension  . Hyperlipidemia  . Diabetes   Subjective:    HPI   Diabetes Mellitus Type II, Follow-up:   Lab Results  Component Value Date   HGBA1C 7.3 (H) 07/03/2017   HGBA1C 7.0 12/31/2016   HGBA1C 6.9 06/25/2016   Last seen for diabetes 6 months ago.  Management since then includes No changes. He reports excellent compliance with treatment. He is not having side effects.  Current symptoms include none and have been stable. Home blood sugar records: fasting range: Low 100's  Episodes of hypoglycemia? no   Current Insulin Regimen: None Most Recent Eye Exam: UTD Weight trend: stable Current diet: in general, a "healthy" diet   Current exercise: daily  ------------------------------------------------------------------------   Hypertension, follow-up:  BP Readings from Last 3 Encounters:  01/07/18 136/70  07/02/17 140/66  12/31/16 136/80    He was last seen for hypertension 6 months ago.  BP at that visit was 140/66. Management since that visit includes No changes He reports excellent compliance with treatment. He is not having side effects.  He is exercising. He is adherent to low salt diet.   Outside blood pressures are: Pt does not check is BP at home. He is experiencing none.  Patient denies chest pain, exertional chest pressure/discomfort, fatigue and lower extremity edema.   Cardiovascular risk factors include advanced age (older than 67 for men, 56 for women), diabetes mellitus, dyslipidemia, hypertension and male gender.  Use of agents associated with hypertension: none.   ------------------------------------------------------------------------    Lipid/Cholesterol, Follow-up:   Last seen for this 6 months ago.  Management since that visit includes No  Changes.  Last Lipid Panel:    Component Value Date/Time   CHOL 142 07/03/2017 0810   CHOL 131 05/20/2012 0940   TRIG 176 (H) 07/03/2017 0810   TRIG 154 05/20/2012 0940   HDL 44 07/03/2017 0810   HDL 44 05/20/2012 0940   CHOLHDL 3.2 07/03/2017 0810   VLDL 31 05/20/2012 0940   LDLCALC 63 07/03/2017 0810   LDLCALC 56 05/20/2012 0940    He reports excellent compliance with treatment. He is not having side effects.   Wt Readings from Last 3 Encounters:  01/07/18 177 lb (80.3 kg)  07/02/17 176 lb 9.6 oz (80.1 kg)  12/31/16 178 lb (80.7 kg)    ------------------------------------------------------------------------  Was seen in June for swelling in neck and noted to have asymetric lymph nodes on ultrasound. Radiologist recommended CT of neck and chest which patient refused at that time.     Allergies  Allergen Reactions  . Hydrocodone-Guaifenesin Shortness Of Breath and Other (See Comments)    Not able to sit still when using Codiclear DH  . Bee Venom Other (See Comments)  . Codeine     Other reaction(s): Pruritus  . Morphine Sulfate     Other reaction(s): Pruritus  . Warfarin Sodium Diarrhea     Current Outpatient Medications:  .  aspirin 81 MG tablet, Take 1 tablet by mouth daily., Disp: , Rfl:  .  Chlorphen-Pseudoephed-APAP (TYLENOL ALLERGY COMPLETE PO), Take by mouth as needed., Disp: , Rfl:  .  clopidogrel (PLAVIX) 75 MG tablet, TAKE 1 TABLET BY MOUTH DAILY, Disp: 30 tablet, Rfl: 12 .  glucose blood (FREESTYLE LITE) test strip,  Use to check blood sugar daily for type 2 diabetes E11.9, Disp: 100 each, Rfl: 12 .  hydrochlorothiazide (HYDRODIURIL) 12.5 MG tablet, Take 1 tablet (12.5 mg total) by mouth daily., Disp: 90 tablet, Rfl: 3 .  Lancets (FREESTYLE) lancets, USE AS DIRECTED DAILY, Disp: 100 each, Rfl: 4 .  LORazepam (ATIVAN) 1 MG tablet, TAKE 1/2 TO 1 TABLET BY MOUTH NIGHTLY AT BEDTIME, Disp: 90 tablet, Rfl: 1 .  losartan (COZAAR) 50 MG tablet, Take 1 tablet (50  mg total) by mouth daily., Disp: 90 tablet, Rfl: 3 .  metFORMIN (GLUCOPHAGE) 850 MG tablet, TAKE 1 TABLET BY MOUTH 2 TIMES DAILY WITH A MEAL., Disp: 60 tablet, Rfl: 11 .  metoprolol tartrate (LOPRESSOR) 25 MG tablet, TAKE 1 TABLET BY MOUTH TWICE DAILY, Disp: 60 tablet, Rfl: 11 .  simvastatin (ZOCOR) 20 MG tablet, TAKE 1 TABLET (20 MG TOTAL) BY MOUTH DAILY., Disp: 90 tablet, Rfl: 4  Review of Systems  Constitutional: Negative.   Respiratory: Positive for cough. Negative for apnea, choking, chest tightness, shortness of breath, wheezing and stridor.   Cardiovascular: Negative.   Musculoskeletal: Negative.   Neurological: Negative for dizziness, light-headedness and headaches.    Social History   Tobacco Use  . Smoking status: Former Smoker    Packs/day: 0.51    Types: Cigarettes    Last attempt to quit: 08/23/2013    Years since quitting: 4.3  . Smokeless tobacco: Never Used  Substance Use Topics  . Alcohol use: No    Alcohol/week: 0.0 standard drinks   Objective:   BP 136/70 (BP Location: Right Arm, Patient Position: Sitting, Cuff Size: Normal)   Pulse 64   Temp 97.9 F (36.6 C) (Oral)   Resp 16   Wt 177 lb (80.3 kg)   BMI 28.57 kg/m     Physical Exam  General Appearance:    Alert, cooperative, no distress  HENT:   bilateral TM normal without fluid or infection, neck has right anterior cervical nodes enlarged and throat normal without erythema or exudate  Eyes:    PERRL, conjunctiva/corneas clear, EOM's intact       Lungs:     Clear to auscultation bilaterally, respirations unlabored  Heart:     Irregularly irregular rhythm. Normal rate   Neurologic:   Awake, alert, oriented x 3. No apparent focal neurological           defect.       Results for orders placed or performed in visit on 01/07/18  POCT glycosylated hemoglobin (Hb A1C)  Result Value Ref Range   Hemoglobin A1C 7.4 (A) 4.0 - 5.6 %       Assessment & Plan:     1. 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(HCC) Stable Continue current medications.   - POCT glycosylated hemoglobin (Hb A1C)  2. Essential hypertension Well controlled.  Continue current medications.   - EKG 12-Lead  3. Hyperlipidemia, unspecified hyperlipidemia type He is tolerating simvastatin well with no adverse effects.    4. Atrial fibrillation (HCC) Discussed risk/benefits Xarelto vs ASA & Plavix. Patient given written prescription for xarelto 15 and will see if covered by insurance. If so will take it in place of ASA and Plavix.   5. Carotid artery disease, unspecified laterality, unspecified type ([place]) Stable. Follow up Dr. Lucky Cowboy Q 2 years  6. Localized swelling, mass and lump, neck repeat- US Soft Tissue Head/Neck; Future       Lelon Huh, MD  [facility]

## 2018-01-07 NOTE — Patient Instructions (Addendum)
   If you insurance covers Xarelto, take one tablet daily in the place of aspirin and clopidogrel

## 2018-01-12 ENCOUNTER — Other Ambulatory Visit: Payer: Self-pay | Admitting: Family Medicine

## 2018-01-12 ENCOUNTER — Ambulatory Visit: Payer: 59

## 2018-01-12 DIAGNOSIS — I10 Essential (primary) hypertension: Secondary | ICD-10-CM

## 2018-01-19 ENCOUNTER — Other Ambulatory Visit: Payer: Self-pay | Admitting: Family Medicine

## 2018-01-20 ENCOUNTER — Telehealth: Payer: Self-pay | Admitting: Family Medicine

## 2018-01-20 ENCOUNTER — Other Ambulatory Visit: Payer: Self-pay | Admitting: Family Medicine

## 2018-01-20 NOTE — Telephone Encounter (Signed)
Pt wife calling to check on the status of the generic clavix.  Pt staying with this Rx.  [facility] sent over 2nd request this morning.  Thanks, [employer]

## 2018-01-20 NOTE — Telephone Encounter (Signed)
Spoke with Mrs Garbers.  She states the Xarelto is too expensive at this time.  They would like to go back to Plavix.   Thanks,   -Mickel Baas

## 2018-01-20 NOTE — Telephone Encounter (Signed)
Please contact patient and see if they had any trouble filling Xarelto prescription.  We received request from pharmacy to refill clopidogrel, but the Xarelto is supposed to replace clopidogrel

## 2018-01-23 ENCOUNTER — Telehealth: Payer: Self-pay

## 2018-01-23 ENCOUNTER — Ambulatory Visit
Admission: RE | Admit: 2018-01-23 | Discharge: 2018-01-23 | Disposition: A | Discharge status: Home / Self Care | Payer: 59 | Source: Ambulatory Visit | Attending: Family Medicine | Admitting: Family Medicine

## 2018-01-23 DIAGNOSIS — R599 Enlarged lymph nodes, unspecified: Secondary | ICD-10-CM | POA: Diagnosis not present

## 2018-01-23 DIAGNOSIS — R221 Localized swelling, mass and lump, neck: Secondary | ICD-10-CM | POA: Diagnosis not present

## 2018-01-23 NOTE — Telephone Encounter (Signed)
-----   Message from Birdie Sons, MD sent at 01/23/2018  3:32 PM EST ----- Ultrasound shows lymph nodes which are unchanged since previous ultrasound. No additional follow up is needed.

## 2018-01-23 NOTE — Telephone Encounter (Signed)
[id number] 

## 2018-01-23 NOTE — Telephone Encounter (Signed)
Patient advised.KW 

## 2018-02-16 ENCOUNTER — Other Ambulatory Visit: Payer: Self-pay | Admitting: Family Medicine

## 2018-04-20 ENCOUNTER — Other Ambulatory Visit: Payer: Self-pay | Admitting: Family Medicine

## 2018-05-06 ENCOUNTER — Other Ambulatory Visit: Payer: Self-pay | Admitting: Family Medicine

## 2018-05-06 DIAGNOSIS — I739 Peripheral vascular disease, unspecified: Principal | ICD-10-CM

## 2018-05-06 DIAGNOSIS — I779 Disorder of arteries and arterioles, unspecified: Secondary | ICD-10-CM

## 2018-05-06 MED FILL — metFORMIN HCL 850 MG TABS: 850 | 30 days supply | Qty: 60 | Fill #0

## 2018-05-07 MED FILL — SIMVASTATIN 20 MG TABLET: 20 | 90 days supply | Qty: 90 | Fill #0 | Status: TO

## 2018-05-13 DIAGNOSIS — L820 Inflamed seborrheic keratosis: Secondary | ICD-10-CM | POA: Diagnosis not present

## 2018-05-13 DIAGNOSIS — L578 Other skin changes due to chronic exposure to nonionizing radiation: Secondary | ICD-10-CM | POA: Diagnosis not present

## 2018-05-13 DIAGNOSIS — L570 Actinic keratosis: Secondary | ICD-10-CM | POA: Diagnosis not present

## 2018-05-13 DIAGNOSIS — D485 Neoplasm of uncertain behavior of skin: Secondary | ICD-10-CM | POA: Diagnosis not present

## 2018-05-13 DIAGNOSIS — D044 Carcinoma in situ of skin of scalp and neck: Secondary | ICD-10-CM | POA: Diagnosis not present

## 2018-05-13 DIAGNOSIS — L821 Other seborrheic keratosis: Secondary | ICD-10-CM | POA: Diagnosis not present

## 2018-05-13 DIAGNOSIS — E119 Type 2 diabetes mellitus without complications: Secondary | ICD-10-CM | POA: Diagnosis not present

## 2018-05-14 MED FILL — LORazepam 1 MG TABS: 1 | 90 days supply | Qty: 90 | Fill #0

## 2018-05-14 MED FILL — CLOPIDOGREL 75 MG TABLET: 75 | 90 days supply | Qty: 90 | Fill #0 | Status: TO

## 2018-05-22 ENCOUNTER — Other Ambulatory Visit: Payer: Self-pay | Admitting: Family Medicine

## 2018-05-22 MED FILL — FREESTYLE LITE TEST STRIP: 90 days supply | Qty: 100 | Fill #0 | Status: TO

## 2018-05-22 MED FILL — FREESTYLE LANCETS: 90 days supply | Qty: 100 | Fill #0

## 2018-06-12 MED FILL — metFORMIN HCL 850 MG TABS: 850 | 30 days supply | Qty: 60 | Fill #1 | Status: TO

## 2018-06-16 MED FILL — METOPROLOL TARTRATE 25 MG T: 25 | 90 days supply | Qty: 180 | Fill #0 | Status: TO

## 2018-07-07 ENCOUNTER — Ambulatory Visit (INDEPENDENT_AMBULATORY_CARE_PROVIDER_SITE_OTHER): Payer: Medicare HMO

## 2018-07-07 ENCOUNTER — Other Ambulatory Visit: Payer: Self-pay

## 2018-07-07 ENCOUNTER — Ambulatory Visit (INDEPENDENT_AMBULATORY_CARE_PROVIDER_SITE_OTHER): Payer: Medicare HMO | Admitting: Family Medicine

## 2018-07-07 VITALS — BP 166/84 | HR 73 | Temp 98.2°F | Ht 66.0 in | Wt 173.6 lb

## 2018-07-07 DIAGNOSIS — J301 Allergic rhinitis due to pollen: Secondary | ICD-10-CM

## 2018-07-07 DIAGNOSIS — H9203 Otalgia, bilateral: Secondary | ICD-10-CM

## 2018-07-07 DIAGNOSIS — Z125 Encounter for screening for malignant neoplasm of prostate: Secondary | ICD-10-CM | POA: Diagnosis not present

## 2018-07-07 DIAGNOSIS — E785 Hyperlipidemia, unspecified: Secondary | ICD-10-CM

## 2018-07-07 DIAGNOSIS — Z0000 Encounter for general adult medical examination without abnormal findings: Secondary | ICD-10-CM

## 2018-07-07 DIAGNOSIS — E1121 Type 2 diabetes mellitus with diabetic nephropathy: Secondary | ICD-10-CM | POA: Diagnosis not present

## 2018-07-07 DIAGNOSIS — I10 Essential (primary) hypertension: Secondary | ICD-10-CM

## 2018-07-07 NOTE — Patient Instructions (Signed)
.   Please review the attached list of medications and notify my office if there are any errors.   . Please bring all of your medications to every appointment so we can make sure that our medication list is the same as yours.    You can take OTC Mucinex (guaifenesin) for chest or sinus congestion and to help your cough
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
## 2018-07-07 NOTE — Patient Instructions (Addendum)
Bobby Thomas , Thank you for taking time to come for your Medicare Wellness Visit. I appreciate your ongoing commitment to your health goals. Please review the following plan we discussed and let me know if I can assist you in the future.   Screening recommendations/referrals: Colonoscopy: No longer required.  Recommended yearly ophthalmology/optometry visit for glaucoma screening and checkup Recommended yearly dental visit for hygiene and checkup  Vaccinations: Influenza vaccine: Up to date Pneumococcal vaccine: Completed series Tdap vaccine: Up to date, due 05/2021 Shingles vaccine: Completed series    Advanced directives: Please bring a copy of your POA (Power of Attorney) and/or Living Will to your next appointment.   Conditions/risks identified: Continue increasing water intake to 6-8 8 oz glasses a day.   Next appointment: 9:00 AM today with Dr Caryn Section.   Preventive Care 76 Years and Older, Male Preventive care refers to lifestyle choices and visits with your health care provider that can promote health and wellness. What does preventive care include?  A yearly physical exam. This is also called an annual well check.  Dental exams once or twice a year.  Routine eye exams. Ask your health care provider how often you should have your eyes checked.  Personal lifestyle choices, including:  Daily care of your teeth and gums.  Regular physical activity.  Eating a healthy diet.  Avoiding tobacco and drug use.  Limiting alcohol use.  Practicing safe sex.  Taking low doses of aspirin every day.  Taking vitamin and mineral supplements as recommended by your health care provider. What happens during an annual well check? The services and screenings done by your health care provider during your annual well check will depend on your age, overall health, lifestyle risk factors, and family history of disease. Counseling  Your health care provider may ask you questions about  your:  Alcohol use.  Tobacco use.  Drug use.  Emotional well-being.  Home and relationship well-being.  Sexual activity.  Eating habits.  History of falls.  Memory and ability to understand (cognition).  Work and work Statistician. Screening  You may have the following tests or measurements:  Height, weight, and BMI.  Blood pressure.  Lipid and cholesterol levels. These may be checked every 5 years, or more frequently if you are over 64 years old.  Skin check.  Lung cancer screening. You may have this screening every year starting at age 62 if you have a 30-pack-year history of smoking and currently smoke or have quit within the past 15 years.  Fecal occult blood test (FOBT) of the stool. You may have this test every year starting at age 37.  Flexible sigmoidoscopy or colonoscopy. You may have a sigmoidoscopy every 5 years or a colonoscopy every 10 years starting at age 18.  Prostate cancer screening. Recommendations will vary depending on your family history and other risks.  Hepatitis C blood test.  Hepatitis B blood test.  Sexually transmitted disease (STD) testing.  Diabetes screening. This is done by checking your blood sugar (glucose) after you have not eaten for a while (fasting). You may have this done every 1-3 years.  Abdominal aortic aneurysm (AAA) screening. You may need this if you are a current or former smoker.  Osteoporosis. You may be screened starting at age 52 if you are at high risk. Talk with your health care provider about your test results, treatment options, and if necessary, the need for more tests. Vaccines  Your health care provider may recommend certain vaccines, such  as:  Influenza vaccine. This is recommended every year.  Tetanus, diphtheria, and acellular pertussis (Tdap, Td) vaccine. You may need a Td booster every 10 years.  Zoster vaccine. You may need this after age 58.  Pneumococcal 13-valent conjugate (PCV13) vaccine.  One dose is recommended after age 48.  Pneumococcal polysaccharide (PPSV23) vaccine. One dose is recommended after age 52. Talk to your health care provider about which screenings and vaccines you need and how often you need them. This information is not intended to replace advice given to you by your health care provider. Make sure you discuss any questions you have with your health care provider. Document Released: 02/17/2015 Document Revised: 10/11/2015 Document Reviewed: 11/22/2014 Elsevier Interactive Patient Education  2017 [place] Prevention in the Home Falls can cause injuries. They can happen to people of all ages. There are many things you can do to make your home safe and to help prevent falls. What can I do on the outside of my home?  Regularly fix the edges of walkways and driveways and fix any cracks.  Remove anything that might make you trip as you walk through a door, such as a raised step or threshold.  Trim any bushes or trees on the path to your home.  Use bright outdoor lighting.  Clear any walking paths of anything that might make someone trip, such as rocks or tools.  Regularly check to see if handrails are loose or broken. Make sure that both sides of any steps have handrails.  Any raised decks and porches should have guardrails on the edges.  Have any leaves, snow, or ice cleared regularly.  Use sand or salt on walking paths during winter.  Clean up any spills in your garage right away. This includes oil or grease spills. What can I do in the bathroom?  Use night lights.  Install grab bars by the toilet and in the tub and shower. Do not use towel bars as grab bars.  Use non-skid mats or decals in the tub or shower.  If you need to sit down in the shower, use a plastic, non-slip stool.  Keep the floor dry. Clean up any water that spills on the floor as soon as it happens.  Remove soap buildup in the tub or shower regularly.  Attach bath  mats securely with double-sided non-slip rug tape.  Do not have throw rugs and other things on the floor that can make you trip. What can I do in the bedroom?  Use night lights.  Make sure that you have a light by your bed that is easy to reach.  Do not use any sheets or blankets that are too big for your bed. They should not hang down onto the floor.  Have a firm chair that has side arms. You can use this for support while you get dressed.  Do not have throw rugs and other things on the floor that can make you trip. What can I do in the kitchen?  Clean up any spills right away.  Avoid walking on wet floors.  Keep items that you use a lot in easy-to-reach places.  If you need to reach something above you, use a strong step stool that has a grab bar.  Keep electrical cords out of the way.  Do not use floor polish or wax that makes floors slippery. If you must use wax, use non-skid floor wax.  Do not have throw rugs and other things on the  floor that can make you trip. What can I do with my stairs?  Do not leave any items on the stairs.  Make sure that there are handrails on both sides of the stairs and use them. Fix handrails that are broken or loose. Make sure that handrails are as long as the stairways.  Check any carpeting to make sure that it is firmly attached to the stairs. Fix any carpet that is loose or worn.  Avoid having throw rugs at the top or bottom of the stairs. If you do have throw rugs, attach them to the floor with carpet tape.  Make sure that you have a light switch at the top of the stairs and the bottom of the stairs. If you do not have them, ask someone to add them for you. What else can I do to help prevent falls?  Wear shoes that:  Do not have high heels.  Have rubber bottoms.  Are comfortable and fit you well.  Are closed at the toe. Do not wear sandals.  If you use a stepladder:  Make sure that it is fully opened. Do not climb a closed  stepladder.  Make sure that both sides of the stepladder are locked into place.  Ask someone to hold it for you, if possible.  Clearly mark and make sure that you can see:  Any grab bars or handrails.  First and last steps.  Where the edge of each step is.  Use tools that help you move around (mobility aids) if they are needed. These include:  Canes.  Walkers.  Scooters.  Crutches.  Turn on the lights when you go into a dark area. Replace any light bulbs as soon as they burn out.  Set up your furniture so you have a clear path. Avoid moving your furniture around.  If any of your floors are uneven, fix them.  If there are any pets around you, be aware of where they are.  Review your medicines with your doctor. Some medicines can make you feel dizzy. This can increase your chance of falling. Ask your doctor what other things that you can do to help prevent falls. This information is not intended to replace advice given to you by your health care provider. Make sure you discuss any questions you have with your health care provider. Document Released: 11/17/2008 Document Revised: 06/29/2015 Document Reviewed: 02/25/2014 Elsevier Interactive Patient Education  2017 [employer].
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
## 2018-07-08 ENCOUNTER — Telehealth: Payer: Self-pay

## 2018-07-08 LAB — COMPREHENSIVE METABOLIC PANEL
ALT: 17 IU/L (ref 0–44)
AST: 15 IU/L (ref 0–40)
Albumin/Globulin Ratio: 1.5 (ref 1.2–2.2)
Albumin: 4.6 g/dL (ref 3.7–4.7)
Alkaline Phosphatase: 60 IU/L (ref 39–117)
BUN/Creatinine Ratio: 15 (ref 10–24)
BUN: 27 mg/dL (ref 8–27)
Bilirubin Total: 1.5 mg/dL — ABNORMAL HIGH (ref 0.0–1.2)
CO2: 25 mmol/L (ref 20–29)
Calcium: 10.2 mg/dL (ref 8.6–10.2)
Chloride: 94 mmol/L — ABNORMAL LOW (ref 96–106)
Creatinine, Ser: 1.82 mg/dL — ABNORMAL HIGH (ref 0.76–1.27)
GFR calc Af Amer: 41 mL/min/{1.73_m2} — ABNORMAL LOW (ref >59)
GFR calc non Af Amer: 36 mL/min/{1.73_m2} — ABNORMAL LOW (ref >59)
Globulin, Total: 3 g/dL (ref 1.5–4.5)
Glucose: 167 mg/dL — ABNORMAL HIGH (ref 65–99)
Potassium: 4.4 mmol/L (ref 3.5–5.2)
Sodium: 136 mmol/L (ref 134–144)
Total Protein: 7.6 g/dL (ref 6.0–8.5)

## 2018-07-08 LAB — LIPID PANEL
Chol/HDL Ratio: 2.5 ratio (ref 0.0–5.0)
Cholesterol, Total: 130 mg/dL (ref 100–199)
HDL: 53 mg/dL (ref >39)
LDL Calculated: 54 mg/dL (ref 0–99)
Triglycerides: 115 mg/dL (ref 0–149)
VLDL Cholesterol Cal: 23 mg/dL (ref 5–40)

## 2018-07-08 LAB — HEMOGLOBIN A1C
Est. average glucose Bld gHb Est-mCnc: 177 mg/dL
Hgb A1c MFr Bld: 7.8 % — ABNORMAL HIGH (ref 4.8–5.6)

## 2018-07-08 LAB — PSA: Prostate Specific Ag, Serum: 0.3 ng/mL (ref 0.0–4.0)

## 2018-07-08 NOTE — Telephone Encounter (Signed)
-----   Message from Birdie Sons, MD sent at 07/08/2018  8:02 AM EDT ----- a1c is up a bit to 7.8. try to be more strict with diet. Kidney functions have declined  A bit. Try to drink more water. Cholesterol is very good at 130, psa is normal. Follow up for diabetes in 4-5 months.

## 2018-07-08 NOTE — Telephone Encounter (Signed)
Pt advised.  Apt made for 11/30/2018 at 9am  Thanks,   -Mickel Baas

## 2018-07-15 DIAGNOSIS — H9209 Otalgia, unspecified ear: Secondary | ICD-10-CM | POA: Diagnosis not present

## 2018-07-15 DIAGNOSIS — M542 Cervicalgia: Secondary | ICD-10-CM | POA: Diagnosis not present

## 2018-07-24 DIAGNOSIS — M898X1 Other specified disorders of bone, shoulder: Secondary | ICD-10-CM | POA: Diagnosis not present

## 2018-07-24 DIAGNOSIS — M25511 Pain in right shoulder: Secondary | ICD-10-CM | POA: Diagnosis not present

## 2018-07-24 DIAGNOSIS — M542 Cervicalgia: Secondary | ICD-10-CM | POA: Diagnosis not present

## 2018-07-24 DIAGNOSIS — M47812 Spondylosis without myelopathy or radiculopathy, cervical region: Secondary | ICD-10-CM | POA: Diagnosis not present

## 2018-08-05 DIAGNOSIS — M542 Cervicalgia: Secondary | ICD-10-CM | POA: Diagnosis not present

## 2018-08-05 DIAGNOSIS — M25511 Pain in right shoulder: Secondary | ICD-10-CM | POA: Diagnosis not present

## 2018-08-14 DIAGNOSIS — R69 Illness, unspecified: Secondary | ICD-10-CM | POA: Diagnosis not present

## 2018-08-14 MED FILL — FREESTYLE LANCETS: 90 days supply | Qty: 100 | Fill #1

## 2018-08-17 ENCOUNTER — Telehealth: Payer: Self-pay | Admitting: Family Medicine

## 2018-08-17 DIAGNOSIS — R69 Illness, unspecified: Secondary | ICD-10-CM | POA: Diagnosis not present

## 2018-08-17 MED ORDER — LORAZEPAM 1 MG PO TABS: ORAL_TABLET | ORAL | 1 refills | Status: DC

## 2018-08-17 NOTE — Telephone Encounter (Signed)
Pt's wife contacted office for refill request on the following medications:  LORazepam (ATIVAN) 1 MG tablet   [facility]  Please advise. Thanks TNP

## 2018-08-17 NOTE — Telephone Encounter (Signed)
Please review

## 2018-08-25 DIAGNOSIS — R69 Illness, unspecified: Secondary | ICD-10-CM | POA: Diagnosis not present

## 2018-08-25 MED FILL — FREESTYLE LANCETS: 90 days supply | Qty: 100 | Fill #1

## 2018-09-02 ENCOUNTER — Other Ambulatory Visit: Payer: Self-pay | Admitting: Family Medicine

## 2018-09-07 ENCOUNTER — Other Ambulatory Visit: Payer: Self-pay | Admitting: Family Medicine

## 2018-09-07 ENCOUNTER — Other Ambulatory Visit (INDEPENDENT_AMBULATORY_CARE_PROVIDER_SITE_OTHER): Payer: Self-pay | Admitting: Vascular Surgery

## 2018-09-07 DIAGNOSIS — I6529 Occlusion and stenosis of unspecified carotid artery: Secondary | ICD-10-CM

## 2018-09-07 MED ORDER — FREESTYLE LITE TEST VI STRP: ORAL_STRIP | 8 refills | Status: DC

## 2018-09-07 NOTE — Telephone Encounter (Signed)
Pt needing refills on glucose blood (FREESTYLE LITE) test strip  [facility] when [facility] it had the wrong code on the refill. Wrong test strips were filled.   [id number] [place], [place] - [address] [phone] (Phone) [phone] (Fax)   Thanks, [employer]

## 2018-09-08 ENCOUNTER — Encounter (INDEPENDENT_AMBULATORY_CARE_PROVIDER_SITE_OTHER): Payer: Medicare HMO

## 2018-09-08 ENCOUNTER — Ambulatory Visit (INDEPENDENT_AMBULATORY_CARE_PROVIDER_SITE_OTHER): Payer: Medicare HMO | Admitting: Vascular Surgery

## 2018-09-08 ENCOUNTER — Encounter (INDEPENDENT_AMBULATORY_CARE_PROVIDER_SITE_OTHER): Payer: Self-pay

## 2018-09-09 ENCOUNTER — Other Ambulatory Visit: Payer: Self-pay | Admitting: Family Medicine

## 2018-09-09 DIAGNOSIS — R69 Illness, unspecified: Secondary | ICD-10-CM | POA: Diagnosis not present

## 2018-09-09 MED ORDER — ONETOUCH ULTRA VI STRP: ORAL_STRIP | 4 refills | Status: DC

## 2018-09-09 MED ORDER — ONETOUCH ULTRA 2 W/DEVICE KIT: PACK | 0 refills | Status: DC

## 2018-09-09 MED ORDER — ONETOUCH ULTRASOFT LANCETS MISC: 4 refills | Status: DC

## 2018-09-09 NOTE — Progress Notes (Signed)
Fax from [facility] preferred diabetic testing supply is OneTouch with request for prescription.

## 2018-09-22 ENCOUNTER — Other Ambulatory Visit: Payer: Self-pay | Admitting: Family Medicine

## 2018-10-05 DIAGNOSIS — E119 Type 2 diabetes mellitus without complications: Secondary | ICD-10-CM | POA: Diagnosis not present

## 2018-10-05 DIAGNOSIS — H2513 Age-related nuclear cataract, bilateral: Secondary | ICD-10-CM | POA: Diagnosis not present

## 2018-10-05 LAB — HM DIABETES EYE EXAM

## 2018-10-15 ENCOUNTER — Other Ambulatory Visit: Payer: Self-pay | Admitting: Family Medicine

## 2018-10-15 NOTE — Telephone Encounter (Signed)
Received request from [facility] for one-touch ultra glucometer kit. Was this supposed to be for a kit or for test strips.

## 2018-10-22 ENCOUNTER — Encounter: Payer: Self-pay | Admitting: Family Medicine

## 2018-10-26 ENCOUNTER — Telehealth: Payer: Self-pay | Admitting: Family Medicine

## 2018-10-26 ENCOUNTER — Encounter: Payer: Self-pay | Admitting: Family Medicine

## 2018-10-26 NOTE — Telephone Encounter (Signed)
Please have patient go ahead and schedule early follow up since his sugars are running high and he has been more fatigued lately    ===View-only below this line===   ----- Message -----      From:Yandel R Butters      Sent:10/26/2018  9:17 AM EDT        VX:[id number] Caryn Section, MD   [place] 05-04-1940. His diabetic numbers are climbing since 10/14/2018. Not as active , feels tired all the time. Numbers have ranged from 177 to 208. Not sure why no fever, or other symptoms. What do we do next. Appt with you on 11/30/2018. Metformin 850mg  am & pm. Never changed. Best number [phone] (Spouse Louise) email [email or web address]. Thank you

## 2018-10-26 NOTE — Telephone Encounter (Signed)
Pt's wife is calling back for 3rd time.  Needing a resposne from her MyChart message for pt regarding his diabetic concerns and symptoms.  Please call wife back asap please.  Thanks, [employer]

## 2018-10-26 NOTE — Telephone Encounter (Signed)
Pharmacy mistake. Request has been canceled.

## 2018-10-27 NOTE — Telephone Encounter (Signed)
Monday is fine. If he starts feeing worse he can get an appointment with another provider sooner.

## 2018-10-27 NOTE — Telephone Encounter (Signed)
Wife was advised and appointment was moved to 11/02/18. KW

## 2018-10-27 NOTE — Telephone Encounter (Signed)
Spoke with wife on phone and arranged an appt for 9/30 at 8:40AM. Mrs. Goodwyn states that she would like to request sooner appointment, she states patients blood sugar was high this morning at 212. I looked through schedule and I only see same day appointments left for Friday-Tuesday, if you would like patient to be seen sooner, please review schedule and let me know what time would be best for patient. Thanks. KW

## 2018-11-01 DIAGNOSIS — R69 Illness, unspecified: Secondary | ICD-10-CM | POA: Diagnosis not present

## 2018-11-02 ENCOUNTER — Ambulatory Visit (INDEPENDENT_AMBULATORY_CARE_PROVIDER_SITE_OTHER): Payer: Medicare HMO | Admitting: Family Medicine

## 2018-11-02 ENCOUNTER — Encounter: Payer: Self-pay | Admitting: Family Medicine

## 2018-11-02 ENCOUNTER — Other Ambulatory Visit: Payer: Self-pay

## 2018-11-02 VITALS — BP 132/82 | HR 68 | Temp 97.3°F | Resp 16 | Wt 177.0 lb

## 2018-11-02 DIAGNOSIS — I10 Essential (primary) hypertension: Secondary | ICD-10-CM | POA: Diagnosis not present

## 2018-11-02 DIAGNOSIS — I779 Disorder of arteries and arterioles, unspecified: Secondary | ICD-10-CM

## 2018-11-02 DIAGNOSIS — Z23 Encounter for immunization: Secondary | ICD-10-CM | POA: Diagnosis not present

## 2018-11-02 DIAGNOSIS — R5383 Other fatigue: Secondary | ICD-10-CM | POA: Diagnosis not present

## 2018-11-02 DIAGNOSIS — I480 Paroxysmal atrial fibrillation: Secondary | ICD-10-CM | POA: Diagnosis not present

## 2018-11-02 DIAGNOSIS — E1121 Type 2 diabetes mellitus with diabetic nephropathy: Secondary | ICD-10-CM

## 2018-11-02 DIAGNOSIS — I739 Peripheral vascular disease, unspecified: Secondary | ICD-10-CM | POA: Diagnosis not present

## 2018-11-02 DIAGNOSIS — K219 Gastro-esophageal reflux disease without esophagitis: Secondary | ICD-10-CM

## 2018-11-02 LAB — POCT URINALYSIS DIPSTICK
Blood, UA: NEGATIVE
Glucose, UA: NEGATIVE
Ketones, UA: NEGATIVE
Leukocytes, UA: NEGATIVE
Nitrite, UA: NEGATIVE
Protein, UA: POSITIVE — AB
Spec Grav, UA: 1.02 (ref 1.010–1.025)
Urobilinogen, UA: 0.2 E.U./dL
pH, UA: 6 (ref 5.0–8.0)

## 2018-11-02 LAB — POCT UA - MICROALBUMIN: Microalbumin Ur, POC: 50 mg/L

## 2018-11-02 LAB — POCT GLYCOSYLATED HEMOGLOBIN (HGB A1C)
Est. average glucose Bld gHb Est-mCnc: 189
Hemoglobin A1C: 8.2 % — AB (ref 4.0–5.6)

## 2018-11-02 MED ORDER — OMEPRAZOLE 20 MG PO CPDR: 20.0000 mg | DELAYED_RELEASE_CAPSULE | Freq: Every day | ORAL | 4 refills | Status: DC

## 2018-11-02 NOTE — Progress Notes (Signed)
[name] Male    DOB: [birth year]   78 y.o.   MRN: [id number] Visit Date: 11/02/2018  Today's Provider: Lelon Huh, MD   Chief Complaint  Patient presents with  . Diabetes  . Hypertension   Subjective:     HPI  Diabetes Mellitus Type II, Follow-up:   Lab Results  Component Value Date   HGBA1C 7.8 (H) 07/07/2018   HGBA1C 7.4 (A) 01/07/2018   HGBA1C 7.3 (H) 07/03/2017    Last seen for diabetes 3 months ago.  Management since then includes no changes. He reports excellent compliance with treatment. He is not having side effects.   Home blood sugar records: upper 100s and low 200s which is above his baseline the last 2-3 weeks.   Episodes of hypoglycemia? no   Most Recent Eye Exam: >1 year ago Weight trend: stable Prior visit with dietician: No   Pertinent Labs:    Component Value Date/Time   CHOL 130 07/07/2018 0935   CHOL 131 05/20/2012 0940   TRIG 115 07/07/2018 0935   TRIG 154 05/20/2012 0940   HDL 53 07/07/2018 0935   HDL 44 05/20/2012 0940   LDLCALC 54 07/07/2018 0935   LDLCALC 56 05/20/2012 0940   CREATININE 1.82 (H) 07/07/2018 0935   CREATININE 1.24 05/20/2012 0940    Wt Readings from Last 3 Encounters:  07/07/18 173 lb 9.6 oz (78.7 kg)  01/07/18 177 lb (80.3 kg)  07/02/17 176 lb 9.6 oz (80.1 kg)    ------------------------------------------------------------------------  Hypertension, follow-up:  BP Readings from Last 3 Encounters:  07/07/18 (!) 166/84  01/07/18 136/70  07/02/17 140/66    He was last seen for hypertension 3 months ago.  BP at that visit was 166/84. Management since that visit includes encouraging patient to drink more water due to declining kidney functions. He reports good compliance with treatment. He is not having side effects.    Weight trend: stable Wt Readings from Last 3 Encounters:  07/07/18 173 lb 9.6 oz (78.7 kg)  01/07/18 177 lb (80.3 kg)  07/02/17 176 lb 9.6 oz (80.1 kg)      ------------------------------------------------------------------------  He does reprot being more fatigued than usually the last 2-3 weeks, but otherwise feels fine, no chest pain, dyspnea, palpations, fevers, URI sx or any unusual aches or pains. Is sleeping well about 8 hours a night, no change in appetite and now bowel habit changes.   Allergies  Allergen Reactions  . Hydrocodone-Guaifenesin Shortness Of Breath and Other (See Comments)    Not able to sit still when using Codiclear DH  . Bee Venom Other (See Comments)  . Codeine     Other reaction(s): Pruritus  . Morphine Sulfate     Other reaction(s): Pruritus  . Warfarin Sodium Diarrhea     Current Outpatient Medications:  .  aspirin EC 81 MG tablet, Take 81 mg by mouth daily., Disp: , Rfl:  .  Blood Glucose Monitoring Suppl (ONE TOUCH ULTRA 2) w/Device KIT, Use to check sugar daily for type 2 diabetes E11.9, Disp: 1 kit, Rfl: 0 .  Chlorphen-Pseudoephed-APAP (TYLENOL ALLERGY COMPLETE PO), Take by mouth as needed., Disp: , Rfl:  .  clopidogrel (PLAVIX) 75 MG tablet, TAKE 1 TABLET BY MOUTH DAILY, Disp: 30 tablet, Rfl: 12 .  EPINEPHrine 0.3 mg/0.3 mL IJ SOAJ injection, as needed. , Disp: , Rfl:  .  glucose blood (ONETOUCH ULTRA) test strip, Use to check sugar daily for type 2 diabetes E11.9,  Disp: 100 each, Rfl: 4 .  hydrochlorothiazide (MICROZIDE) 12.5 MG capsule, TAKE 1 CAPSULE (12.5 MG TOTAL) BY MOUTH DAILY., Disp: 90 capsule, Rfl: 3 .  Lancets (ONETOUCH ULTRASOFT) lancets, Use to check sugar daily for type 2 diabetes E11.9, Disp: 100 each, Rfl: 4 .  LORazepam (ATIVAN) 1 MG tablet, TAKE 1/2 TO 1 TABLET BY MOUTH NIGHTLY AT BEDTIME, Disp: 90 tablet, Rfl: 1 .  losartan (COZAAR) 50 MG tablet, TAKE 1 TABLET (50 MG TOTAL) BY MOUTH DAILY., Disp: 90 tablet, Rfl: 3 .  metFORMIN (GLUCOPHAGE) 850 MG tablet, TAKE 1 TABLET BY MOUTH 2 TIMES DAILY WITH A MEAL (USE NDC [phone]), Disp: 60 tablet, Rfl: 11 .  metoprolol tartrate (LOPRESSOR)  25 MG tablet, TAKE 1 TABLET BY MOUTH TWICE A DAY, Disp: 180 tablet, Rfl: 4 .  omeprazole (PRILOSEC) 20 MG capsule, Take 20 mg by mouth daily., Disp: , Rfl:  .  SF 1.1 % GEL dental gel, Place 1 application onto teeth at bedtime. , Disp: , Rfl:  .  simvastatin (ZOCOR) 20 MG tablet, TAKE 1 TABLET (20 MG TOTAL) BY MOUTH DAILY., Disp: 90 tablet, Rfl: 4  Review of Systems  Constitutional: Negative for appetite change, chills and fever.  Respiratory: Negative for chest tightness, shortness of breath and wheezing.   Cardiovascular: Negative for chest pain and palpitations.  Gastrointestinal: Negative for abdominal pain, nausea and vomiting.    Social History   Tobacco Use  . Smoking status: Former Smoker    Packs/day: 0.51    Types: Cigarettes    Quit date: 08/23/2013    Years since quitting: 5.1  . Smokeless tobacco: Never Used  Substance Use Topics  . Alcohol use: No    Alcohol/week: 0.0 standard drinks      Objective:   BP 132/82 (BP Location: Right Arm, Patient Position: Sitting, Cuff Size: Normal)   Pulse 68   Temp (!) 97.3 F (36.3 C) (Temporal)   Resp 16   Wt 177 lb (80.3 kg)   BMI 28.57 kg/m     Physical Exam   General Appearance:    Alert, cooperative, no distress  Eyes:    PERRL, conjunctiva/corneas clear, EOM's intact       Lungs:     Clear to auscultation bilaterally, respirations unlabored  Heart:    Normal heart rate. Irregularly irregular rhythm. No murmurs, rubs, or gallops.   MS:   All extremities are intact.   Neurologic:   Awake, alert, oriented x 3. No apparent focal neurological           defect.        Results for orders placed or performed in visit on 11/02/18  POCT HgB A1C  Result Value Ref Range   Hemoglobin A1C 8.2 (A) 4.0 - 5.6 %   HbA1c POC (<> result, manual entry)     HbA1c, POC (prediabetic range)     HbA1c, POC (controlled diabetic range)     Est. average glucose Bld gHb Est-mCnc 189   POCT Urinalysis Dipstick  Result Value Ref Range    Color, UA amber    Clarity, UA clear    Glucose, UA Negative Negative   Bilirubin, UA small    Ketones, UA negative    Spec Grav, UA 1.020 1.010 - 1.025   Blood, UA negative    pH, UA 6.0 5.0 - 8.0   Protein, UA Positive (A) Negative   Urobilinogen, UA 0.2 0.2 or 1.0 E.U./dL   Nitrite, UA negative  Leukocytes, UA Negative Negative   Appearance     Odor    POCT UA - Microalbumin  Result Value Ref Range   Microalbumin Ur, POC 50 mg/L   Creatinine, POC n/a mg/dL   Albumin/Creatinine Ratio, Urine, POC n/a        Assessment & Plan    1. 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(HCC) A1c up from his baseline  2. Other fatigue About 2 weeks duration, no clear etiology.  - Comprehensive metabolic panel - TSH - CBC  3. Essential hypertension Fairly well controlled, Continue current medications.   - Magnesium  4. Carotid artery disease, unspecified laterality, unspecified type ([place]) Asymptomatic. Compliant with medication.  Continue aggressive risk factor modification.    5. Need for influenza vaccination  - Flu Vaccine QUAD High Dose(Fluad)  6. Intermittent atrial fibrillation (HCC) Rate well controlled.   7. Gastroesophageal reflux disease, esophagitis presence not specified Has been on OTC Prilosec for long period of time and requests prescription.  - Magnesium     Lelon Huh, MD  [place]

## 2018-11-03 LAB — COMPREHENSIVE METABOLIC PANEL
ALT: 14 IU/L (ref 0–44)
AST: 21 IU/L (ref 0–40)
Albumin/Globulin Ratio: 1.5 (ref 1.2–2.2)
Albumin: 4.3 g/dL (ref 3.7–4.7)
Alkaline Phosphatase: 54 IU/L (ref 39–117)
BUN/Creatinine Ratio: 21 (ref 10–24)
BUN: 39 mg/dL — ABNORMAL HIGH (ref 8–27)
Bilirubin Total: 0.5 mg/dL (ref 0.0–1.2)
CO2: 22 mmol/L (ref 20–29)
Calcium: 10.3 mg/dL — ABNORMAL HIGH (ref 8.6–10.2)
Chloride: 97 mmol/L (ref 96–106)
Creatinine, Ser: 1.87 mg/dL — ABNORMAL HIGH (ref 0.76–1.27)
GFR calc Af Amer: 39 mL/min/{1.73_m2} — ABNORMAL LOW (ref >59)
GFR calc non Af Amer: 34 mL/min/{1.73_m2} — ABNORMAL LOW (ref >59)
Globulin, Total: 2.9 g/dL (ref 1.5–4.5)
Glucose: 114 mg/dL — ABNORMAL HIGH (ref 65–99)
Potassium: 5.6 mmol/L — ABNORMAL HIGH (ref 3.5–5.2)
Sodium: 136 mmol/L (ref 134–144)
Total Protein: 7.2 g/dL (ref 6.0–8.5)

## 2018-11-03 LAB — MAGNESIUM: Magnesium: 1.8 mg/dL (ref 1.6–2.3)

## 2018-11-03 LAB — CBC
Hematocrit: 38.8 % (ref 37.5–51.0)
Hemoglobin: 12.8 g/dL — ABNORMAL LOW (ref 13.0–17.7)
MCH: 29.9 pg (ref 26.6–33.0)
MCHC: 33 g/dL (ref 31.5–35.7)
MCV: 91 fL (ref 79–97)
Platelets: 302 10*3/uL (ref 150–450)
RBC: 4.28 x10E6/uL (ref 4.14–5.80)
RDW: 13.4 % (ref 11.6–15.4)
WBC: 14.3 10*3/uL — ABNORMAL HIGH (ref 3.4–10.8)

## 2018-11-03 LAB — TSH: TSH: 1.67 u[IU]/mL (ref 0.450–4.500)

## 2018-11-04 ENCOUNTER — Ambulatory Visit: Payer: Self-pay | Admitting: Family Medicine

## 2018-11-04 ENCOUNTER — Telehealth: Payer: Self-pay

## 2018-11-04 NOTE — Telephone Encounter (Signed)
Pt's wife stated that when they went to pharmacy to pick up omeprazole (PRILOSEC) 20 MG capsule pharmacist stated that this medication could react with pt's other medications. Pt's wife stated that prior to getting an Rx for this medication pt was taking his wife's omeprazole and she is asking if that could have caused pt's sugar to become elevated and is also asking if Dr. Caryn Section still wants pt to take the medication. Please advise. Thanks TNP

## 2018-11-06 ENCOUNTER — Telehealth: Payer: Self-pay

## 2018-11-06 DIAGNOSIS — E1121 Type 2 diabetes mellitus with diabetic nephropathy: Secondary | ICD-10-CM

## 2018-11-06 DIAGNOSIS — I10 Essential (primary) hypertension: Secondary | ICD-10-CM

## 2018-11-06 MED ORDER — DEXILANT 30 MG PO CPDR: 30.0000 mg | DELAYED_RELEASE_CAPSULE | Freq: Every day | ORAL | 5 refills | Status: DC

## 2018-11-06 MED ORDER — GLIPIZIDE 5 MG PO TABS: 5.0000 mg | ORAL_TABLET | Freq: Every day | ORAL | 3 refills | Status: DC

## 2018-11-06 NOTE — Telephone Encounter (Signed)
-----   Message from Birdie Sons, MD sent at 11/05/2018  8:10 AM EDT ----- Kidney functions have declined and potassium is high. Need to reduce losartan to 1/2 tablet daily. Need to drink 2-3 more glasses of water every day.  Need to ADD glipizide 5mg  one table every morning for better control of blood sugar. Need to follow up in 10-14 days to check sugar and potassium labs.

## 2018-11-06 NOTE — Telephone Encounter (Signed)
Patient wife was advised. Medication was sent into the pharmacy. Appt scheduled.

## 2018-11-06 NOTE — Telephone Encounter (Signed)
Possible interaction with Plavix. Have sent new for dexilant to take instead

## 2018-11-09 NOTE — Patient Instructions (Signed)
.   Please review the attached list of medications and notify my office if there are any errors.   . Please bring all of your medications to every appointment so we can make sure that our medication list is the same as yours.   . It is especially important to get the annual flu vaccine this year. If you haven't had it already, please go to your pharmacy or call the office as soon as possible to schedule you flu shot.  

## 2018-11-09 NOTE — Telephone Encounter (Signed)
Wife was advised. KW

## 2018-11-13 ENCOUNTER — Telehealth: Payer: Self-pay

## 2018-11-13 NOTE — Telephone Encounter (Signed)
Please review and advise. KW 

## 2018-11-13 NOTE — Telephone Encounter (Signed)
Bobby Thomas, wife of patient called stating that the acid reflux medication that you prescribed was over $200.00.  They are unable to pay that.  She is asking if there is something else you can prescribe.  They use [facility] in [place] and their Call Back # is [phone]

## 2018-11-16 MED ORDER — LANSOPRAZOLE 30 MG PO CPDR: 30.0000 mg | DELAYED_RELEASE_CAPSULE | Freq: Every day | ORAL | 5 refills | Status: DC

## 2018-11-16 NOTE — Addendum Note (Signed)
Addended by: Birdie Sons on: 11/16/2018 09:53 AM   Modules accepted: Orders

## 2018-11-18 ENCOUNTER — Ambulatory Visit (INDEPENDENT_AMBULATORY_CARE_PROVIDER_SITE_OTHER): Payer: Medicare HMO | Admitting: Family Medicine

## 2018-11-18 ENCOUNTER — Other Ambulatory Visit: Payer: Self-pay

## 2018-11-18 ENCOUNTER — Encounter: Payer: Self-pay | Admitting: Family Medicine

## 2018-11-18 VITALS — BP 148/74 | HR 80 | Temp 96.2°F | Resp 16 | Wt 178.4 lb

## 2018-11-18 DIAGNOSIS — E1121 Type 2 diabetes mellitus with diabetic nephropathy: Secondary | ICD-10-CM

## 2018-11-18 DIAGNOSIS — E875 Hyperkalemia: Secondary | ICD-10-CM | POA: Diagnosis not present

## 2018-11-18 DIAGNOSIS — I10 Essential (primary) hypertension: Secondary | ICD-10-CM | POA: Diagnosis not present

## 2018-11-18 MED ORDER — LOSARTAN POTASSIUM 50 MG PO TABS: 25.0000 mg | ORAL_TABLET | Freq: Every day | ORAL | Status: DC

## 2018-11-18 NOTE — Progress Notes (Signed)
[name] Male    DOB: 1940/09/13   78 y.o.   MRN: [id number] Visit Date: 11/18/2018  Today's Provider: Lelon Huh, MD   Chief Complaint  Patient presents with  . Abnormal Lab    Follow Up   Subjective:     HPI Patient returns to clinic today to follow up from recent labs drawn on 11/02/18. Recent labs showed a declining kidney fucntion and elevated potassium level of 5.6 , patient was advised to reduce Losartan in half and was started on Glipizide 32m for better control of his blood sugar due to A1c of 8.2 . Patient reports good compliance on medication, patient reports that blood sugar home readings have been ranging from 124-136 average. Patient states that fasting blood sugars have been >170.  He states that overall he is feeling better with more energy since last visit.  Allergies  Allergen Reactions  . Hydrocodone-Guaifenesin Shortness Of Breath and Other (See Comments)    Not able to sit still when using Codiclear DH  . Bee Venom Other (See Comments)  . Codeine     Other reaction(s): Pruritus  . Morphine Sulfate     Other reaction(s): Pruritus  . Warfarin Sodium Diarrhea     Current Outpatient Medications:  .  aspirin EC 81 MG tablet, Take 81 mg by mouth daily., Disp: , Rfl:  .  Blood Glucose Monitoring Suppl (ONE TOUCH ULTRA 2) w/Device KIT, Use to check sugar daily for type 2 diabetes E11.9, Disp: 1 kit, Rfl: 0 .  Chlorphen-Pseudoephed-APAP (TYLENOL ALLERGY COMPLETE PO), Take by mouth as needed., Disp: , Rfl:  .  clopidogrel (PLAVIX) 75 MG tablet, TAKE 1 TABLET BY MOUTH DAILY, Disp: 30 tablet, Rfl: 12 .  EPINEPHrine 0.3 mg/0.3 mL IJ SOAJ injection, as needed. , Disp: , Rfl:  .  glipiZIDE (GLUCOTROL) 5 MG tablet, Take 1 tablet (5 mg total) by mouth daily before breakfast., Disp: 30 tablet, Rfl: 3 .  glucose blood (ONETOUCH ULTRA) test strip, Use to check sugar daily for type 2 diabetes E11.9, Disp: 100 each, Rfl: 4 .  hydrochlorothiazide  (MICROZIDE) 12.5 MG capsule, TAKE 1 CAPSULE (12.5 MG TOTAL) BY MOUTH DAILY., Disp: 90 capsule, Rfl: 3 .  Lancets (ONETOUCH ULTRASOFT) lancets, Use to check sugar daily for type 2 diabetes E11.9, Disp: 100 each, Rfl: 4 .  lansoprazole (PREVACID) 30 MG capsule, Take 1 capsule (30 mg total) by mouth daily., Disp: 30 capsule, Rfl: 5 .  LORazepam (ATIVAN) 1 MG tablet, TAKE 1/2 TO 1 TABLET BY MOUTH NIGHTLY AT BEDTIME, Disp: 90 tablet, Rfl: 1 .  losartan (COZAAR) 50 MG tablet, TAKE 1 TABLET (50 MG TOTAL) BY MOUTH DAILY., Disp: 90 tablet, Rfl: 3 .  metFORMIN (GLUCOPHAGE) 850 MG tablet, TAKE 1 TABLET BY MOUTH 2 TIMES DAILY WITH A MEAL (USE NDC [phone], Disp: 60 tablet, Rfl: 11 .  metoprolol tartrate (LOPRESSOR) 25 MG tablet, TAKE 1 TABLET BY MOUTH TWICE A DAY, Disp: 180 tablet, Rfl: 4 .  SF 1.1 % GEL dental gel, Place 1 application onto teeth at bedtime. , Disp: , Rfl:  .  simvastatin (ZOCOR) 20 MG tablet, TAKE 1 TABLET (20 MG TOTAL) BY MOUTH DAILY., Disp: 90 tablet, Rfl: 4  Review of Systems  Constitutional: Negative.   HENT: Negative.   Cardiovascular: Negative.   Gastrointestinal: Negative.   Endocrine: Negative for cold intolerance, heat intolerance, polydipsia, polyphagia and polyuria.  Neurological: Negative for dizziness, light-headedness, numbness and headaches.  Social History   Tobacco Use  . Smoking status: Former Smoker    Packs/day: 0.51    Types: Cigarettes    Quit date: 08/23/2013    Years since quitting: 5.2  . Smokeless tobacco: Never Used  Substance Use Topics  . Alcohol use: No    Alcohol/week: 0.0 standard drinks      Objective:   BP (!) 148/74   Pulse 80   Temp (!) 96.2 F (35.7 C) (Oral)   Resp 16   Wt 178 lb 6.4 oz (80.9 kg)   BMI 28.79 kg/m  Vitals:   11/18/18 0849  BP: (!) 148/74  Pulse: 80  Resp: 16  Temp: (!) 96.2 F (35.7 C)  TempSrc: Oral  Weight: 178 lb 6.4 oz (80.9 kg)  Body mass index is 28.79 kg/m.   Physical Exam    General  Appearance:    Overweight male in no acute distress  Eyes:    PERRL, conjunctiva/corneas clear, EOM's intact       Lungs:     Clear to auscultation bilaterally, respirations unlabored  Heart:    Normal heart rate. Regular rhythm. No murmurs, rubs, or gallops.   MS:   All extremities are intact.   Neurologic:   Awake, alert, oriented x 3. No apparent focal neurological           defect.            Assessment & Plan    1. Hyperkalemia Has since reduced losartan to 1/2 of 38m tablet daily.  - Renal function panel  2. Essential hypertension Fairly well controlled on current regiment  3. 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([place] Tolerating initiation of glipizide. If potassium is back down will follow up to check a1c in 3 months.   The entirety of the information documented in the History of Present Illness, Review of Systems and Physical Exam were personally obtained by me. Portions of this information were initially documented by KMinette Headland CMA and reviewed by me for thoroughness and accuracy.      DLelon Huh MD  [facility]

## 2018-11-18 NOTE — Patient Instructions (Signed)
.   Please review the attached list of medications and notify my office if there are any errors.   . Please bring all of your medications to every appointment so we can make sure that our medication list is the same as yours.   . It is especially important to get the annual flu vaccine this year. If you haven't had it already, please go to your pharmacy or call the office as soon as possible to schedule you flu shot.  

## 2018-11-19 LAB — RENAL FUNCTION PANEL
Albumin: 4.5 g/dL (ref 3.7–4.7)
BUN/Creatinine Ratio: 23 (ref 10–24)
BUN: 48 mg/dL — ABNORMAL HIGH (ref 8–27)
CO2: 20 mmol/L (ref 20–29)
Calcium: 10.1 mg/dL (ref 8.6–10.2)
Chloride: 100 mmol/L (ref 96–106)
Creatinine, Ser: 2.07 mg/dL — ABNORMAL HIGH (ref 0.76–1.27)
GFR calc Af Amer: 35 mL/min/{1.73_m2} — ABNORMAL LOW (ref >59)
GFR calc non Af Amer: 30 mL/min/{1.73_m2} — ABNORMAL LOW (ref >59)
Glucose: 85 mg/dL (ref 65–99)
Phosphorus: 4.9 mg/dL — ABNORMAL HIGH (ref 2.8–4.1)
Potassium: 4.9 mmol/L (ref 3.5–5.2)
Sodium: 138 mmol/L (ref 134–144)

## 2018-11-20 ENCOUNTER — Telehealth: Payer: Self-pay

## 2018-11-20 ENCOUNTER — Other Ambulatory Visit: Payer: Self-pay | Admitting: Family Medicine

## 2018-11-20 DIAGNOSIS — N1832 Chronic kidney disease, stage 3b: Secondary | ICD-10-CM

## 2018-11-20 NOTE — Telephone Encounter (Signed)
Patient's wife is calling requesting lab results. Please review. Thanks!

## 2018-11-23 ENCOUNTER — Telehealth: Payer: Self-pay

## 2018-11-23 NOTE — Telephone Encounter (Signed)
-----   Message from Birdie Sons, MD sent at 11/20/2018  3:29 PM EDT ----- Potassium levels are back to normal. Kidney functions are getting worse.  Needs referral to kidney specialists. Have entered referral.

## 2018-11-23 NOTE — Telephone Encounter (Signed)
Patient's wife Barbaraann Share advised. Please schedule appt.

## 2018-11-30 ENCOUNTER — Ambulatory Visit: Payer: Medicare HMO | Admitting: Family Medicine

## 2018-12-02 ENCOUNTER — Encounter: Payer: Self-pay | Admitting: Family Medicine

## 2018-12-08 DIAGNOSIS — R69 Illness, unspecified: Secondary | ICD-10-CM | POA: Diagnosis not present

## 2018-12-25 ENCOUNTER — Other Ambulatory Visit: Payer: Self-pay | Admitting: Nephrology

## 2018-12-25 DIAGNOSIS — E875 Hyperkalemia: Secondary | ICD-10-CM | POA: Insufficient documentation

## 2018-12-25 DIAGNOSIS — N1832 Chronic kidney disease, stage 3b: Secondary | ICD-10-CM | POA: Diagnosis not present

## 2018-12-25 DIAGNOSIS — I129 Hypertensive chronic kidney disease with stage 1 through stage 4 chronic kidney disease, or unspecified chronic kidney disease: Secondary | ICD-10-CM | POA: Diagnosis not present

## 2018-12-25 DIAGNOSIS — E1122 Type 2 diabetes mellitus with diabetic chronic kidney disease: Secondary | ICD-10-CM | POA: Diagnosis not present

## 2018-12-29 ENCOUNTER — Encounter: Payer: Self-pay | Admitting: Medical Oncology

## 2018-12-29 ENCOUNTER — Inpatient Hospital Stay: Payer: Medicare HMO

## 2018-12-29 ENCOUNTER — Ambulatory Visit: Payer: Self-pay

## 2018-12-29 ENCOUNTER — Emergency Department: Payer: Medicare HMO

## 2018-12-29 ENCOUNTER — Other Ambulatory Visit: Payer: Self-pay

## 2018-12-29 ENCOUNTER — Inpatient Hospital Stay
Admission: EM | Admit: 2018-12-29 | Discharge: 2018-12-30 | DRG: 390 | Disposition: A | Discharge status: Home / Self Care | Payer: Medicare HMO | Attending: Internal Medicine | Admitting: Internal Medicine

## 2018-12-29 DIAGNOSIS — I4891 Unspecified atrial fibrillation: Secondary | ICD-10-CM | POA: Diagnosis not present

## 2018-12-29 DIAGNOSIS — I480 Paroxysmal atrial fibrillation: Secondary | ICD-10-CM | POA: Diagnosis not present

## 2018-12-29 DIAGNOSIS — I129 Hypertensive chronic kidney disease with stage 1 through stage 4 chronic kidney disease, or unspecified chronic kidney disease: Secondary | ICD-10-CM | POA: Diagnosis present

## 2018-12-29 DIAGNOSIS — Z7902 Long term (current) use of antithrombotics/antiplatelets: Secondary | ICD-10-CM

## 2018-12-29 DIAGNOSIS — I10 Essential (primary) hypertension: Secondary | ICD-10-CM | POA: Diagnosis present

## 2018-12-29 DIAGNOSIS — Z87891 Personal history of nicotine dependence: Secondary | ICD-10-CM | POA: Diagnosis not present

## 2018-12-29 DIAGNOSIS — Z7984 Long term (current) use of oral hypoglycemic drugs: Secondary | ICD-10-CM | POA: Diagnosis not present

## 2018-12-29 DIAGNOSIS — Z6828 Body mass index (BMI) 28.0-28.9, adult: Secondary | ICD-10-CM

## 2018-12-29 DIAGNOSIS — K4090 Unilateral inguinal hernia, without obstruction or gangrene, not specified as recurrent: Secondary | ICD-10-CM | POA: Diagnosis present

## 2018-12-29 DIAGNOSIS — Z4682 Encounter for fitting and adjustment of non-vascular catheter: Secondary | ICD-10-CM | POA: Diagnosis not present

## 2018-12-29 DIAGNOSIS — Z9081 Acquired absence of spleen: Secondary | ICD-10-CM

## 2018-12-29 DIAGNOSIS — Z20828 Contact with and (suspected) exposure to other viral communicable diseases: Secondary | ICD-10-CM | POA: Diagnosis present

## 2018-12-29 DIAGNOSIS — E669 Obesity, unspecified: Secondary | ICD-10-CM | POA: Diagnosis present

## 2018-12-29 DIAGNOSIS — Z79899 Other long term (current) drug therapy: Secondary | ICD-10-CM | POA: Diagnosis not present

## 2018-12-29 DIAGNOSIS — Z8673 Personal history of transient ischemic attack (TIA), and cerebral infarction without residual deficits: Secondary | ICD-10-CM

## 2018-12-29 DIAGNOSIS — Z888 Allergy status to other drugs, medicaments and biological substances status: Secondary | ICD-10-CM

## 2018-12-29 DIAGNOSIS — Z978 Presence of other specified devices: Secondary | ICD-10-CM

## 2018-12-29 DIAGNOSIS — E785 Hyperlipidemia, unspecified: Secondary | ICD-10-CM | POA: Diagnosis present

## 2018-12-29 DIAGNOSIS — N184 Chronic kidney disease, stage 4 (severe): Secondary | ICD-10-CM | POA: Diagnosis present

## 2018-12-29 DIAGNOSIS — K56609 Unspecified intestinal obstruction, unspecified as to partial versus complete obstruction: Secondary | ICD-10-CM | POA: Diagnosis not present

## 2018-12-29 DIAGNOSIS — Z91030 Bee allergy status: Secondary | ICD-10-CM

## 2018-12-29 DIAGNOSIS — F329 Major depressive disorder, single episode, unspecified: Secondary | ICD-10-CM | POA: Diagnosis present

## 2018-12-29 DIAGNOSIS — Z833 Family history of diabetes mellitus: Secondary | ICD-10-CM

## 2018-12-29 DIAGNOSIS — E1121 Type 2 diabetes mellitus with diabetic nephropathy: Secondary | ICD-10-CM | POA: Diagnosis present

## 2018-12-29 DIAGNOSIS — K565 Intestinal adhesions [bands], unspecified as to partial versus complete obstruction: Principal | ICD-10-CM | POA: Diagnosis present

## 2018-12-29 DIAGNOSIS — E1165 Type 2 diabetes mellitus with hyperglycemia: Secondary | ICD-10-CM | POA: Diagnosis present

## 2018-12-29 DIAGNOSIS — N1832 Chronic kidney disease, stage 3b: Secondary | ICD-10-CM | POA: Diagnosis not present

## 2018-12-29 DIAGNOSIS — R112 Nausea with vomiting, unspecified: Secondary | ICD-10-CM

## 2018-12-29 DIAGNOSIS — Z95820 Peripheral vascular angioplasty status with implants and grafts: Secondary | ICD-10-CM | POA: Diagnosis not present

## 2018-12-29 DIAGNOSIS — Z885 Allergy status to narcotic agent status: Secondary | ICD-10-CM

## 2018-12-29 DIAGNOSIS — N183 Chronic kidney disease, stage 3 unspecified: Secondary | ICD-10-CM | POA: Diagnosis present

## 2018-12-29 DIAGNOSIS — F419 Anxiety disorder, unspecified: Secondary | ICD-10-CM | POA: Diagnosis present

## 2018-12-29 DIAGNOSIS — R1110 Vomiting, unspecified: Secondary | ICD-10-CM | POA: Diagnosis not present

## 2018-12-29 DIAGNOSIS — Z7982 Long term (current) use of aspirin: Secondary | ICD-10-CM

## 2018-12-29 DIAGNOSIS — Z8249 Family history of ischemic heart disease and other diseases of the circulatory system: Secondary | ICD-10-CM | POA: Diagnosis not present

## 2018-12-29 DIAGNOSIS — E860 Dehydration: Secondary | ICD-10-CM | POA: Diagnosis present

## 2018-12-29 DIAGNOSIS — E1122 Type 2 diabetes mellitus with diabetic chronic kidney disease: Secondary | ICD-10-CM | POA: Diagnosis present

## 2018-12-29 LAB — COMPREHENSIVE METABOLIC PANEL
ALT: 15 U/L (ref 0–44)
AST: 28 U/L (ref 15–41)
Albumin: 4.6 g/dL (ref 3.5–5.0)
Alkaline Phosphatase: 51 U/L (ref 38–126)
Anion gap: 16 — ABNORMAL HIGH (ref 5–15)
BUN: 61 mg/dL — ABNORMAL HIGH (ref 8–23)
CO2: 24 mmol/L (ref 22–32)
Calcium: 9.6 mg/dL (ref 8.9–10.3)
Chloride: 98 mmol/L (ref 98–111)
Creatinine, Ser: 1.96 mg/dL — ABNORMAL HIGH (ref 0.61–1.24)
GFR calc Af Amer: 37 mL/min — ABNORMAL LOW (ref >60)
GFR calc non Af Amer: 32 mL/min — ABNORMAL LOW (ref >60)
Glucose, Bld: 236 mg/dL — ABNORMAL HIGH (ref 70–99)
Potassium: 4.4 mmol/L (ref 3.5–5.1)
Sodium: 138 mmol/L (ref 135–145)
Total Bilirubin: 1.6 mg/dL — ABNORMAL HIGH (ref 0.3–1.2)
Total Protein: 8.8 g/dL — ABNORMAL HIGH (ref 6.5–8.1)

## 2018-12-29 LAB — CBC
HCT: 42.2 % (ref 39.0–52.0)
Hemoglobin: 14.1 g/dL (ref 13.0–17.0)
MCH: 30 pg (ref 26.0–34.0)
MCHC: 33.4 g/dL (ref 30.0–36.0)
MCV: 89.8 fL (ref 80.0–100.0)
Platelets: 313 10*3/uL (ref 150–400)
RBC: 4.7 MIL/uL (ref 4.22–5.81)
RDW: 14.6 % (ref 11.5–15.5)
WBC: 10.6 10*3/uL — ABNORMAL HIGH (ref 4.0–10.5)
nRBC: 0.2 % (ref 0.0–0.2)

## 2018-12-29 LAB — GLUCOSE, CAPILLARY
Glucose-Capillary: 157 mg/dL — ABNORMAL HIGH (ref 70–99)
Glucose-Capillary: 130 mg/dL — ABNORMAL HIGH (ref 70–99)
Glucose-Capillary: 153 mg/dL — ABNORMAL HIGH (ref 70–99)

## 2018-12-29 LAB — URINALYSIS, COMPLETE (UACMP) WITH MICROSCOPIC
Bacteria, UA: NONE SEEN
Bilirubin Urine: NEGATIVE
Glucose, UA: 50 mg/dL — AB
Ketones, ur: NEGATIVE mg/dL
Leukocytes,Ua: NEGATIVE
Nitrite: NEGATIVE
Protein, ur: 100 mg/dL — AB
Specific Gravity, Urine: 1.039 — ABNORMAL HIGH (ref 1.005–1.030)
Squamous Epithelial / HPF: NONE SEEN (ref 0–5)
pH: 5 (ref 5.0–8.0)

## 2018-12-29 LAB — SARS CORONAVIRUS 2 (TAT 6-24 HRS): SARS Coronavirus 2: NEGATIVE

## 2018-12-29 LAB — LIPASE, BLOOD: Lipase: 26 U/L (ref 11–51)

## 2018-12-29 MED ORDER — ONDANSETRON HCL 4 MG/2ML IJ SOLN
4.0000 mg | Freq: Once | INTRAMUSCULAR | Status: AC
  Administered 2018-12-29: 4 mg via INTRAVENOUS
  Filled 2018-12-29: qty 2

## 2018-12-29 MED ORDER — LORAZEPAM 0.5 MG PO TABS: 0.5000 mg | ORAL_TABLET | Freq: Every evening | ORAL | Status: DC | PRN

## 2018-12-29 MED ORDER — SIMVASTATIN 20 MG PO TABS
20.0000 mg | ORAL_TABLET | Freq: Every day | ORAL | Status: DC
  Administered 2018-12-30: 20 mg via ORAL
  Filled 2018-12-29: qty 1

## 2018-12-29 MED ORDER — INSULIN ASPART 100 UNIT/ML ~~LOC~~ SOLN
0.0000 [IU] | SUBCUTANEOUS | Status: DC
  Administered 2018-12-29: 2 [IU] via SUBCUTANEOUS
  Administered 2018-12-29 – 2018-12-30 (×3): 3 [IU] via SUBCUTANEOUS
  Administered 2018-12-30 (×3): 2 [IU] via SUBCUTANEOUS
  Filled 2018-12-29 (×7): qty 1

## 2018-12-29 MED ORDER — FENTANYL CITRATE (PF) 100 MCG/2ML IJ SOLN: 25.0000 ug | INTRAMUSCULAR | Status: DC | PRN

## 2018-12-29 MED ORDER — DEXTROSE IN LACTATED RINGERS 5 % IV SOLN
INTRAVENOUS | Status: DC
  Administered 2018-12-29 – 2018-12-30 (×3): via INTRAVENOUS

## 2018-12-29 MED ORDER — METOPROLOL TARTRATE 25 MG PO TABS
25.0000 mg | ORAL_TABLET | Freq: Two times a day (BID) | ORAL | Status: DC
  Administered 2018-12-29 – 2018-12-30 (×3): 25 mg via ORAL
  Filled 2018-12-29: qty 0.5
  Filled 2018-12-29 (×3): qty 1

## 2018-12-29 MED ORDER — IOHEXOL 300 MG/ML  SOLN
75.0000 mL | Freq: Once | INTRAMUSCULAR | Status: AC | PRN
  Administered 2018-12-29: 75 mL via INTRAVENOUS

## 2018-12-29 MED ORDER — PROMETHAZINE HCL 25 MG PO TABS: 12.5000 mg | ORAL_TABLET | Freq: Four times a day (QID) | ORAL | Status: DC | PRN

## 2018-12-29 MED ORDER — SODIUM CHLORIDE 0.9% FLUSH
3.0000 mL | Freq: Two times a day (BID) | INTRAVENOUS | Status: DC
  Administered 2018-12-29: 3 mL via INTRAVENOUS

## 2018-12-29 MED ORDER — LACTATED RINGERS IV BOLUS
1000.0000 mL | Freq: Once | INTRAVENOUS | Status: AC
  Administered 2018-12-29: 1000 mL via INTRAVENOUS

## 2018-12-29 NOTE — Telephone Encounter (Signed)
Per request of pt's wife, call placed to [facility] ER to make the nurse in ER aware that pt. Is coming in for eval.  Spoke with Triage nurse, Nira Conn.  Advised of pt's. Onset of abdominal pain and vomiting since 11/23.  Verb. Understanding.   Phone call to pt.  Advised that [facility] has been made aware that he is coming to the ER.  Advised pt. That his wife can walk him into the ER, and then she will have to wait in the car, once the pt. Is checked in.  Pt. Verb. Understanding.

## 2018-12-29 NOTE — Telephone Encounter (Signed)
Pt's. wife called to report pt. Had onset of abdominal pain on 11/23.  Pt. also on the call.  C/o mid abdominal pain; constant; rated 10/10.  Denied radiation of the pain. Reported vomited x 5 in past 24 hrs. C/o abd. Bloating.  Reported has had "regular BM's, until yesterday, and then had diarrhea x 1."  Denied any blood in stool.  Denied fever/ chills.  C/o increased weakness in past 24 hrs.  Reported hx of bowel blockage in past.  Advised to go to the ER for eval.  Advised not to eat or drink anything until evaluated.  Wife and pt. Verb. Understanding.     Reason for Disposition . [1] SEVERE pain (e.g., excruciating) AND [2] present > 1 hour  Answer Assessment - Initial Assessment Questions 1. LOCATION: "Where does it hurt?"      Mid-abdomen 2. RADIATION: "Does the pain shoot anywhere else?" (e.g., chest, back)     Denied  3. ONSET: "When did the pain begin?" (Minutes, hours or days ago)      Yesterday  4. SUDDEN: "Gradual or sudden onset?"     Sudden on 11/23 5. PATTERN "Does the pain come and go, or is it constant?"    - If constant: "Is it getting better, staying the same, or worsening?"      (Note: Constant means the pain never goes away completely; most serious pain is constant and it progresses)     - If intermittent: "How long does it last?" "Do you have pain now?"     (Note: Intermittent means the pain goes away completely between bouts)     Constant  6. SEVERITY: "How bad is the pain?"  (e.g., Scale 1-10; mild, moderate, or severe)    - MILD (1-3): doesn't interfere with normal activities, abdomen soft and not tender to touch     - MODERATE (4-7): interferes with normal activities or awakens from sleep, tender to touch     - SEVERE (8-10): excruciating pain, doubled over, unable to do any normal activities       10/10 7. RECURRENT SYMPTOM: "Have you ever had this type of abdominal pain before?" If so, ask: "When was the last time?" and "What happened that time?"      Hx of bowel  blockage  8. CAUSE: "What do you think is causing the abdominal pain?"     *No Answer* 9. RELIEVING/AGGRAVATING FACTORS: "What makes it better or worse?" (e.g., movement, antacids, bowel movement)     *No Answer* 10. OTHER SYMPTOMS: "Has there been any vomiting, diarrhea, constipation, or urine problems?"       Last BM 11/23; loose stool x1 yesterday; was "regular BM's prior to the loose stool" ; vomiting; abd. Bloating, weakness.  Protocols used: ABDOMINAL PAIN - MALE-A-AH

## 2018-12-29 NOTE — Progress Notes (Addendum)
Ng tube reinserted via right nare without difficulty. Pt. tolerated procedure well. Awaiting chest xray to verify placement.

## 2018-12-29 NOTE — Progress Notes (Signed)
Report called to [facility] for pt. going to room 205.

## 2018-12-29 NOTE — ED Triage Notes (Signed)
Pt reports generalized abd pain that began yesterday with nausea and vomiting. Denies fever.
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
## 2018-12-29 NOTE — ED Notes (Signed)
Pt c/o mid abd pain with uncontrolled N/V since Sunday, pt has a hx of bowel obstruction.

## 2018-12-29 NOTE — Telephone Encounter (Signed)
From [facility] 

## 2018-12-29 NOTE — Plan of Care (Signed)
Patient is alert and oriented and tolerating po meds well. Will reconnect NG tube hour after blood pressure med. Bobby Thomas

## 2018-12-30 DIAGNOSIS — I480 Paroxysmal atrial fibrillation: Secondary | ICD-10-CM

## 2018-12-30 DIAGNOSIS — E1121 Type 2 diabetes mellitus with diabetic nephropathy: Secondary | ICD-10-CM

## 2018-12-30 DIAGNOSIS — N1832 Chronic kidney disease, stage 3b: Secondary | ICD-10-CM

## 2018-12-30 DIAGNOSIS — E785 Hyperlipidemia, unspecified: Secondary | ICD-10-CM

## 2018-12-30 LAB — TYPE AND SCREEN
ABO/RH(D): O POS
Antibody Screen: NEGATIVE

## 2018-12-30 LAB — BASIC METABOLIC PANEL
Anion gap: 11 (ref 5–15)
BUN: 48 mg/dL — ABNORMAL HIGH (ref 8–23)
CO2: 29 mmol/L (ref 22–32)
Calcium: 9.1 mg/dL (ref 8.9–10.3)
Chloride: 102 mmol/L (ref 98–111)
Creatinine, Ser: 1.71 mg/dL — ABNORMAL HIGH (ref 0.61–1.24)
GFR calc Af Amer: 44 mL/min — ABNORMAL LOW (ref >60)
GFR calc non Af Amer: 38 mL/min — ABNORMAL LOW (ref >60)
Glucose, Bld: 159 mg/dL — ABNORMAL HIGH (ref 70–99)
Potassium: 4.4 mmol/L (ref 3.5–5.1)
Sodium: 142 mmol/L (ref 135–145)

## 2018-12-30 LAB — CBC
HCT: 39.8 % (ref 39.0–52.0)
Hemoglobin: 12.7 g/dL — ABNORMAL LOW (ref 13.0–17.0)
MCH: 29.9 pg (ref 26.0–34.0)
MCHC: 31.9 g/dL (ref 30.0–36.0)
MCV: 93.6 fL (ref 80.0–100.0)
Platelets: 273 10*3/uL (ref 150–400)
RBC: 4.25 MIL/uL (ref 4.22–5.81)
RDW: 14.6 % (ref 11.5–15.5)
WBC: 10.5 10*3/uL (ref 4.0–10.5)
nRBC: 0.3 % — ABNORMAL HIGH (ref 0.0–0.2)

## 2018-12-30 LAB — GLUCOSE, CAPILLARY
Glucose-Capillary: 146 mg/dL — ABNORMAL HIGH (ref 70–99)
Glucose-Capillary: 154 mg/dL — ABNORMAL HIGH (ref 70–99)
Glucose-Capillary: 150 mg/dL — ABNORMAL HIGH (ref 70–99)
Glucose-Capillary: 123 mg/dL — ABNORMAL HIGH (ref 70–99)

## 2018-12-30 LAB — HEMOGLOBIN A1C
Hgb A1c MFr Bld: 7.4 % — ABNORMAL HIGH (ref 4.8–5.6)
Mean Plasma Glucose: 165.68 mg/dL

## 2018-12-30 LAB — PROTIME-INR
INR: 1.1 (ref 0.8–1.2)
Prothrombin Time: 13.7 seconds (ref 11.4–15.2)

## 2018-12-30 LAB — APTT: aPTT: 29 seconds (ref 24–36)

## 2018-12-30 NOTE — Progress Notes (Signed)
[facility] SURGICAL PROGRESS NOTE (cpt [phone])  Hospital Day(s): 1.   Interval History: Patient seen and examined, no acute events or new complaints overnight. Patient reports he is feeling so much better./ He notes "all my symptoms when I cam ine are gone now." No abdominal pain, nausea, emesis, or distension. NGT output slowed and now appears like saliva, only 400 ccs. He has had multiple bowel movements in the last 24 hours and has continued passing flatus frequently this morning. Mild leukocytosis resolved, renal function appears baseline, no other issues.  Review of Systems:  Constitutional: denies fever, chills  HEENT: denies cough or congestion  Respiratory: denies any shortness of breath  Cardiovascular: denies chest pain or palpitations  Gastrointestinal: denies abdominal pain, N/V, or diarrhea/and bowel function as per interval history Genitourinary: denies burning with urination or urinary frequency   Vital signs in last 24 hours: [min-max] current  Temp:  [97.7 F (36.5 C)-98.3 F (36.8 C)] 98.2 F (36.8 C) (11/25 0426) Pulse Rate:  [86-103] 86 (11/25 0426) Resp:  [16-20] 18 (11/25 0426) BP: (113-139)/(60-98) 133/78 (11/25 0426) SpO2:  [94 %-99 %] 99 % (11/25 0426) Weight:  [81.2 kg] 81.2 kg (11/24 0928)     Height: 5\' 7"  (170.2 cm) Weight: 81.2 kg BMI (Calculated): 28.03   Intake/Output last 2 shifts:  11/24 0701 - 11/25 0700 In: 2217.8 [I.V.:1218.8; IV Piggyback:999] Out: 0998 [Urine:375; Emesis/NG output:400]   Physical Exam:  Constitutional: alert, cooperative and no distress  HENT: normocephalic without obvious abnormality, NGT in place  Eyes: PERRL, EOM's grossly intact and symmetric  Respiratory: breathing non-labored at rest  Cardiovascular: regular rate and sinus rhythm  Gastrointestinal: soft, non-tender, and non-distended. No rebound/guarding. Previous laparotomy incision present, healed Musculoskeletal: no edema or wounds, motor and  sensation grossly intact, NT    Labs:  CBC Latest Ref Rng & Units 12/30/2018 12/29/2018 11/02/2018  WBC 4.0 - 10.5 K/uL 10.5 10.6(H) 14.3(H)  Hemoglobin 13.0 - 17.0 g/dL 12.7(L) 14.1 12.8(L)  Hematocrit 39.0 - 52.0 % 39.8 42.2 38.8  Platelets 150 - 400 K/uL 273 313 302   CMP Latest Ref Rng & Units 12/30/2018 12/29/2018 11/18/2018  Glucose 70 - 99 mg/dL 159(H) 236(H) 85  BUN 8 - 23 mg/dL 48(H) 61(H) 48(H)  Creatinine 0.61 - 1.24 mg/dL 1.71(H) 1.96(H) 2.07(H)  Sodium 135 - 145 mmol/L 142 138 138  Potassium 3.5 - 5.1 mmol/L 4.4 4.4 4.9  Chloride 98 - 111 mmol/L 102 98 100  CO2 22 - 32 mmol/L 29 24 20   Calcium 8.9 - 10.3 mg/dL 9.1 9.6 10.1  Total Protein 6.5 - 8.1 g/dL - 8.8(H) -  Total Bilirubin 0.3 - 1.2 mg/dL - 1.6(H) -  Alkaline Phos 38 - 126 U/L - 51 -  AST 15 - 41 U/L - 28 -  ALT 0 - 44 U/L - 15 -    Imaging studies: No new pertinent imaging studies   Assessment/Plan: (ICD-10's: K79.609) 78 y.o. male with clinically resolved small bowel obstruction most likely secondary to post-surgical adhesions, complicated by multiple pertinent comorbidities   - Okay to discontinue NGT  - Start CLD; ADAT   - pain control prn (minimize narcotics); antiemetics prn             - Monitor abdominal examination; on-going bowel function    - Continue mobilization   - further management per primary service; if tolerates advancement of diet okay for discharge in next 24-48 hours   All of the above findings and  recommendations were discussed with the patient, patient's family (wife at bedside), and the medical team, and all of patient's and family's questions were answered to their expressed satisfaction.   -- Edison Simon, PA-C [facility] 12/30/2018, 7:12 AM [phone] M-F: 7am - 4pm

## 2018-12-30 NOTE — Progress Notes (Addendum)
Per Zack PA okay for RN to Dc NG tube and start pt in clear liquids. Also okay to advance diet as tolerated. RN will continue to assess and monitor pt.

## 2018-12-30 NOTE — Discharge Summary (Signed)
Physician Discharge Summary  Bobby Thomas SVX:[id number] DOB: 05-06-1940 DOA: 12/29/2018  PCP: Birdie Sons, MD  Admit date: 12/29/2018 Discharge date: 12/30/2018  Admitted From: Inpatient Disposition: home  Recommendations for Outpatient Follow-up:  1. Follow up with PCP in 1-2 weeks   Home Health:No Equipment/Devices:no new equipment  Discharge Condition:Stable CODE STATUS:Full code Diet recommendation: Diabetic diet  Brief/Interim Summary: TARREN Thomas is a 78 y.o. male with medical history significant of HTN, HLD, DM2, PAF not on AC, h/o stroke, CKD3, h/o hernia repair, h/o SBO, present to ED with abdominal pain for 3 days, vomiting for 2 days.  His pain is located mostly at mid abdomen, also at LLQ, sharp, 8-10/10, wax and weaning. No significant aggravating or alleviating factors.  Associated with nausea, started to have vomiting since yesterday, gradually getting worse, emesis were nonbloody non-bilious.  His last bowel movement was yesterday morning. He denies fever, chills, cough, chest pain, or dysuria.  ED Course: pt was hemodynamically stable.  He has allergy to morphine, did not require pain medications.  Labs significant for WBC 10.6, BUN/creatinine 61/1.96, was 48/2.07 last month.  Abdominal pelvis CT showed mid small bowel obstruction with transition zone in anterior RIGHT mid abdomen.  Patient was given Zofran and admitted for small bowel obstruction.  Hospital course: Small bowel obstruction. Patient was admitted to [facility] bed was placed on n.p.o.'s diet status, NG tube was placed to intermittent low wall suction. Patient was also placed on IV fluids. Patient was seen by general surgery in consultation. No surgical intervention was necessary. Patient had resolution of small bowel obstruction with multiple bowel movements. NG tube was removed, diet was advanced. Patient tolerated clear liquids and full liquids. Advancing to soft diet currently. If tolerates will  be stable for discharge home today. The patient has adverse consequences or complications addendum will follow. We will continue patient's home medications for hypertension hyperlipidemia and paroxysmal A. fib. He is on aspirin and Plavix but not on aggressive anticoagulation. Will defer that decision to patient and his family provider. Aspirin and Plavix initially held for possible surgery patient can resume on discharge. Patient will also continue his home medications Metformin and glipizide. He was on sliding scale insulin in the hospital. It was noted is a hemoglobin A1c was 8.2 on September 28 and extensive conversation with the patient on the importance of dietary management of his diabetic hyperglycemia. He will follow-up with his PCP for close A1c monitoring and lipid control.  I discussed with patient's nurse monitoring diet and if patient tolerates soft diet without complication patient be stable for discharge home.  Discharge Diagnoses:  Principal Problem:   SBO (small bowel obstruction) (HCC) Active Problems:   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(HCC)   HLD (hyperlipidemia)   Essential hypertension   Intermittent atrial fibrillation (HCC)   Chronic kidney disease (CKD), stage III (moderate)    Discharge Instructions  Discharge Instructions    Call MD for:  difficulty breathing, headache or visual disturbances   Complete by: As directed    Call MD for:  extreme fatigue   Complete by: As directed    Call MD for:  hives   Complete by: As directed    Call MD for:  persistant dizziness or light-headedness   Complete by: As directed    Call MD for:  persistant nausea and vomiting   Complete by: As directed    Call MD for:  severe uncontrolled pain   Complete by: As directed  Call MD for:  temperature >100.4   Complete by: As directed    Diet Carb Modified   Complete by: As directed    Increase activity slowly   Complete by: As directed      Allergies as of 12/30/2018       Reactions   Hydrocodone-guaifenesin Shortness Of Breath, Other (See Comments)   Not able to sit still when using Codiclear DH   Bee Venom Other (See Comments)   Codeine    Other reaction(s): Pruritus   Morphine Sulfate    Other reaction(s): Pruritus   Warfarin Sodium Diarrhea      Medication List    TAKE these medications   aspirin EC 81 MG tablet Take 81 mg by mouth daily.   clopidogrel 75 MG tablet Commonly known as: PLAVIX TAKE 1 TABLET BY MOUTH DAILY   EPINEPHrine 0.3 mg/0.3 mL Soaj injection Commonly known as: EPI-PEN as needed.   glipiZIDE 5 MG tablet Commonly known as: GLUCOTROL Take 1 tablet (5 mg total) by mouth daily before breakfast.   LORazepam 1 MG tablet Commonly known as: ATIVAN TAKE 1/2 TO 1 TABLET BY MOUTH NIGHTLY AT BEDTIME   losartan 50 MG tablet Commonly known as: COZAAR Take 0.5 tablets (25 mg total) by mouth daily. What changed: how much to take   metFORMIN 850 MG tablet Commonly known as: GLUCOPHAGE TAKE 1 TABLET BY MOUTH 2 TIMES DAILY WITH A MEAL (USE NDC [phone])   metoprolol tartrate 25 MG tablet Commonly known as: LOPRESSOR TAKE 1 TABLET BY MOUTH TWICE A DAY   simvastatin 20 MG tablet Commonly known as: ZOCOR TAKE 1 TABLET (20 MG TOTAL) BY MOUTH DAILY.       Allergies  Allergen Reactions  . Hydrocodone-Guaifenesin Shortness Of Breath and Other (See Comments)    Not able to sit still when using Codiclear DH  . Bee Venom Other (See Comments)  . Codeine     Other reaction(s): Pruritus  . Morphine Sulfate     Other reaction(s): Pruritus  . Warfarin Sodium Diarrhea    Consultations:  gen surgery   Procedures/Studies: Dg Abd 1 View  Result Date: 12/29/2018 CLINICAL DATA:  NG tube placement EXAM: ABDOMEN - 1 VIEW COMPARISON:  12/29/2018, CT 12/29/2018 FINDINGS: Esophageal tube tip and side port overlie the proximal stomach. Dilated loops of small bowel in the upper abdomen. Lung bases are clear IMPRESSION: 1.  Esophageal tube tip overlies the proximal stomach 2. Dilated air-filled loops of small bowel consistent with bowel obstruction Electronically Signed   By: Donavan Foil M.D.   On: 12/29/2018 22:42   Ct Abdomen Pelvis W Contrast  Result Date: 12/29/2018 CLINICAL DATA:  Mid abdominal pain, nausea, and vomiting since Sunday, history of bowel obstruction, suspected high grade bowel obstruction; history diabetes mellitus, hypertension, former smoker EXAM: CT ABDOMEN AND PELVIS WITH CONTRAST TECHNIQUE: Multidetector CT imaging of the abdomen and pelvis was performed using the standard protocol following bolus administration of intravenous contrast. Sagittal and coronal MPR images reconstructed from axial data set. CONTRAST:  55mL OMNIPAQUE IOHEXOL 300 MG/ML SOLN IV. No oral contrast. COMPARISON:  12/09/2015 FINDINGS: Lower chest: Minimal linear subsegmental atelectasis RIGHT lower lobe. Hepatobiliary: Calcifications within RIGHT lobe liver unchanged. Metallic foreign bodies adjacent to gallbladder. Gallbladder and liver otherwise normal appearance. Pancreas: Normal appearance Spleen: Surgically absent, with multiple metallic foreign bodies at splenic bed Adrenals/Urinary Tract: Adrenal glands unremarkable. BILATERAL renal cortical atrophy without mass or hydronephrosis. Ureters and bladder normal appearance Stomach/Bowel: Colon decompressed. Appendix  not visualized. Dilated proximal and decompressed distal small bowel loops consistent with mid small bowel obstruction. Transition zone is in the anterior RIGHT mid abdomen image 41, suspect adhesion. No bowel wall thickening or mass. Stomach mildly distended by fluid. 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 Additional atherosclerotic calcifications at renal artery origins, SMA and celiac origin, coronary arteries, and iliac arteries. No adenopathy. Reproductive:  Unremarkable prostate gland and seminal vesicles Other: LEFT inguinal hernia containing fat. No free air or free fluid. No inflammatory process. Musculoskeletal: Osseous demineralization. IMPRESSION: Mid small bowel obstruction with transition zone in anterior RIGHT mid abdomen, suspect adhesion. LEFT inguinal hernia containing fat. 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 Recommend followup by [place] in 3 years. This recommendation follows ACR consensus guidelines: White Paper of the ACR Incidental Findings Committee II on Vascular Findings. J Am Coll Radiol 2013; 10:789-794 Aortic Atherosclerosis (ICD10-I70.0). Aortic aneurysm NOS (ICD10-I71.9). Electronically Signed   By: Lavonia Dana M.D.   On: 12/29/2018 10:41   Dg Abd Portable 1 View  Result Date: 12/29/2018 CLINICAL DATA:  NG tube placement. Small bowel obstruction. EXAM: PORTABLE ABDOMEN - 1 VIEW COMPARISON:  None. FINDINGS: NG tube tip is in the body of the stomach. Multiple dilated loops of small bowel are seen in the mid abdomen. IMPRESSION: 1. NG tube tip is in the body of the stomach. 2. Multiple dilated loops of small bowel in the mid abdomen consistent with previously demonstrated small-bowel obstruction. Electronically Signed   By: Lorriane Shire M.D.   On: 12/29/2018 12:29       Subjective: Patient reports he feels fine had multiple bowel movements is passing gas no abdominal pain Tolerated liquid diet both clear and full. Advancing with soft diet now. Patient requested discharge to be tolerated soft diet.  Discharge Exam: Vitals:   12/30/18 0816 12/30/18 1346  BP: (!) 151/82 137/68  Pulse: 83 71  Resp: 17   Temp:  98.7 F (37.1 C)  SpO2: 96% 98%   Vitals:   12/29/18 2305 12/30/18 0426 12/30/18 0816 12/30/18 1346  BP: 139/74 133/78 (!) 151/82 137/68  Pulse: (!) 103 86 83 71  Resp: 18 18 17    Temp: 97.7 F (36.5 C) 98.2 F (36.8 C)  98.7 F  (37.1 C)  TempSrc: Oral Oral  Oral  SpO2: 94% 99% 96% 98%  Weight:      Height:        General: Pt is alert, awake, not in acute distress Cardiovascular: RRR, S1/S2 +, no rubs, no gallops Respiratory: CTA bilaterally, no wheezing, no rhonchi Abdominal: Soft, NT, ND, bowel sounds + Extremities: no edema, no cyanosis    The results of significant diagnostics from this hospitalization (including imaging, microbiology, ancillary and laboratory) are listed below for reference.     Microbiology: Recent Results (from the past 240 hour(s))  SARS CORONAVIRUS 2 (TAT 6-24 HRS) Nasopharyngeal Nasopharyngeal Swab     Status: None   Collection Time: 12/29/18 12:28 PM   Specimen: Nasopharyngeal Swab  Result Value Ref Range Status   SARS Coronavirus 2 NEGATIVE NEGATIVE Final    Comment: (NOTE) SARS-CoV-2 target nucleic acids are NOT DETECTED. The SARS-CoV-2 RNA is generally detectable in upper and lower respiratory specimens during the acute phase of infection. Negative results do not preclude SARS-CoV-2 infection, do not rule out co-infections with other pathogens, and should not  be used as the sole basis for treatment or other patient management decisions. Negative results must be combined with clinical observations, patient history, and epidemiological information. The expected result is Negative. Fact Sheet for Patients: SugarRoll.be Fact Sheet for Healthcare Providers: https://www.woods-mathews.com/ This test is not yet approved or cleared by the [place] FDA and  has been authorized for detection and/or diagnosis of SARS-CoV-2 by FDA under an Emergency Use Authorization (EUA). This EUA will remain  in effect (meaning this test can be used) for the duration of the COVID-19 declaration under Section 56 4(b)(1) of the Act, 21 U.S.C. section 360bbb-3(b)(1), unless the authorization is terminated or revoked sooner. Performed at [facility], [place] [address], [place], [place] [address]      Labs: BNP (last 3 results) No results for input(s): BNP in the last 8760 hours. Basic Metabolic Panel: Recent Labs  Lab 12/29/18 0930 12/30/18 0404  NA 138 142  K 4.4 4.4  CL 98 102  CO2 24 29  GLUCOSE 236* 159*  BUN 61* 48*  CREATININE 1.96* 1.71*  CALCIUM 9.6 9.1   Liver Function Tests: Recent Labs  Lab 12/29/18 0930  AST 28  ALT 15  ALKPHOS 51  BILITOT 1.6*  PROT 8.8*  ALBUMIN 4.6   Recent Labs  Lab 12/29/18 0930  LIPASE 26   No results for input(s): AMMONIA in the last 168 hours. CBC: Recent Labs  Lab 12/29/18 0930 12/30/18 0404  WBC 10.6* 10.5  HGB 14.1 12.7*  HCT 42.2 39.8  MCV 89.8 93.6  PLT 313 273   Cardiac Enzymes: No results for input(s): CKTOTAL, CKMB, CKMBINDEX, TROPONINI in the last 168 hours. BNP: Invalid input(s): POCBNP CBG: Recent Labs  Lab 12/29/18 1635 12/29/18 2236 12/30/18 0443 12/30/18 0746 12/30/18 1138  GLUCAP 130* 153* 146* 154* 150*   D-Dimer No results for input(s): DDIMER in the last 72 hours. Hgb A1c Recent Labs    12/30/18 0404  HGBA1C 7.4*   Lipid Profile No results for input(s): CHOL, HDL, LDLCALC, TRIG, CHOLHDL, LDLDIRECT in the last 72 hours. Thyroid function studies No results for input(s): TSH, T4TOTAL, T3FREE, THYROIDAB in the last 72 hours.  Invalid input(s): FREET3 Anemia work up No results for input(s): VITAMINB12, FOLATE, FERRITIN, TIBC, IRON, RETICCTPCT in the last 72 hours. Urinalysis    Component Value Date/Time   COLORURINE YELLOW (A) 12/29/2018 1644   APPEARANCEUR CLEAR (A) 12/29/2018 1644   LABSPEC 1.039 (H) 12/29/2018 1644   PHURINE 5.0 12/29/2018 1644   GLUCOSEU 50 (A) 12/29/2018 1644   HGBUR SMALL (A) 12/29/2018 1644   BILIRUBINUR NEGATIVE 12/29/2018 1644   BILIRUBINUR small 11/02/2018 1334   KETONESUR NEGATIVE 12/29/2018 1644   PROTEINUR 100 (A) 12/29/2018 1644   UROBILINOGEN 0.2 11/02/2018 1334   NITRITE NEGATIVE  12/29/2018 1644   LEUKOCYTESUR NEGATIVE 12/29/2018 1644   Sepsis Labs Invalid input(s): PROCALCITONIN,  WBC,  LACTICIDVEN Microbiology Recent Results (from the past 240 hour(s))  SARS CORONAVIRUS 2 (TAT 6-24 HRS) Nasopharyngeal Nasopharyngeal Swab     Status: None   Collection Time: 12/29/18 12:28 PM   Specimen: Nasopharyngeal Swab  Result Value Ref Range Status   SARS Coronavirus 2 NEGATIVE NEGATIVE Final    Comment: (NOTE) SARS-CoV-2 target nucleic acids are NOT DETECTED. The SARS-CoV-2 RNA is generally detectable in upper and lower respiratory specimens during the acute phase of infection. Negative results do not preclude SARS-CoV-2 infection, do not rule out co-infections with other pathogens, and should not be used as  the sole basis for treatment or other patient management decisions. Negative results must be combined with clinical observations, patient history, and epidemiological information. The expected result is Negative. Fact Sheet for Patients: SugarRoll.be Fact Sheet for Healthcare Providers: https://www.woods-mathews.com/ This test is not yet approved or cleared by the [place] FDA and  has been authorized for detection and/or diagnosis of SARS-CoV-2 by FDA under an Emergency Use Authorization (EUA). This EUA will remain  in effect (meaning this test can be used) for the duration of the COVID-19 declaration under Section 56 4(b)(1) of the Act, 21 U.S.C. section 360bbb-3(b)(1), unless the authorization is terminated or revoked sooner. Performed at [facility], [place] [address] [place], [place] [address]      Time coordinating discharge: Over 30 minutes  SIGNED:   Nicolette Bang, MD  [facility] 12/30/2018, 4:05 PM Pager   If 7PM-7AM, please contact night-coverage [email or web address] Password [email or web address]

## 2018-12-30 NOTE — Progress Notes (Signed)
Bobby Thomas  A and O x 4. VSS. Pt tolerating diet well. No complaints of pain or nausea. IV removed intact, no new prescriptions given. Pt voiced understanding of discharge instructions with no further questions. Pt discharged via wheelchair with NT.   Allergies as of 12/30/2018      Reactions   Hydrocodone-guaifenesin Shortness Of Breath, Other (See Comments)   Not able to sit still when using Codiclear DH   Bee Venom Other (See Comments)   Codeine    Other reaction(s): Pruritus   Morphine Sulfate    Other reaction(s): Pruritus   Warfarin Sodium Diarrhea      Medication List    TAKE these medications   aspirin EC 81 MG tablet Take 81 mg by mouth daily.   clopidogrel 75 MG tablet Commonly known as: PLAVIX TAKE 1 TABLET BY MOUTH DAILY   EPINEPHrine 0.3 mg/0.3 mL Soaj injection Commonly known as: EPI-PEN as needed.   glipiZIDE 5 MG tablet Commonly known as: GLUCOTROL Take 1 tablet (5 mg total) by mouth daily before breakfast.   LORazepam 1 MG tablet Commonly known as: ATIVAN TAKE 1/2 TO 1 TABLET BY MOUTH NIGHTLY AT BEDTIME   losartan 50 MG tablet Commonly known as: COZAAR Take 0.5 tablets (25 mg total) by mouth daily. What changed: how much to take   metFORMIN 850 MG tablet Commonly known as: GLUCOPHAGE TAKE 1 TABLET BY MOUTH 2 TIMES DAILY WITH A MEAL (USE NDC [phone])   metoprolol tartrate 25 MG tablet Commonly known as: LOPRESSOR TAKE 1 TABLET BY MOUTH TWICE A DAY   simvastatin 20 MG tablet Commonly known as: ZOCOR TAKE 1 TABLET (20 MG TOTAL) BY MOUTH DAILY.       Vitals:   12/30/18 0816 12/30/18 1346  BP: (!) 151/82 137/68  Pulse: 83 71  Resp: 17   Temp:  98.7 F (37.1 C)  SpO2: 96% 98%    Francesco Sor

## 2019-01-05 ENCOUNTER — Ambulatory Visit
Admission: RE | Admit: 2019-01-05 | Discharge: 2019-01-05 | Disposition: A | Discharge status: Home / Self Care | Payer: Medicare HMO | Source: Ambulatory Visit | Attending: Nephrology | Admitting: Nephrology

## 2019-01-05 ENCOUNTER — Other Ambulatory Visit: Payer: Self-pay

## 2019-01-05 DIAGNOSIS — N1832 Chronic kidney disease, stage 3b: Secondary | ICD-10-CM | POA: Diagnosis not present

## 2019-01-05 DIAGNOSIS — L578 Other skin changes due to chronic exposure to nonionizing radiation: Secondary | ICD-10-CM | POA: Diagnosis not present

## 2019-01-05 DIAGNOSIS — E1122 Type 2 diabetes mellitus with diabetic chronic kidney disease: Secondary | ICD-10-CM | POA: Insufficient documentation

## 2019-01-05 DIAGNOSIS — L820 Inflamed seborrheic keratosis: Secondary | ICD-10-CM | POA: Diagnosis not present

## 2019-01-05 DIAGNOSIS — L570 Actinic keratosis: Secondary | ICD-10-CM | POA: Diagnosis not present

## 2019-01-05 DIAGNOSIS — N183 Chronic kidney disease, stage 3 unspecified: Secondary | ICD-10-CM | POA: Diagnosis not present

## 2019-01-05 DIAGNOSIS — Z86007 Personal history of in-situ neoplasm of skin: Secondary | ICD-10-CM | POA: Diagnosis not present

## 2019-01-19 ENCOUNTER — Other Ambulatory Visit: Payer: Self-pay | Admitting: Family Medicine

## 2019-01-19 DIAGNOSIS — E1121 Type 2 diabetes mellitus with diabetic nephropathy: Secondary | ICD-10-CM

## 2019-01-23 ENCOUNTER — Other Ambulatory Visit: Payer: Self-pay | Admitting: Family Medicine

## 2019-01-23 DIAGNOSIS — I10 Essential (primary) hypertension: Secondary | ICD-10-CM

## 2019-01-27 DIAGNOSIS — R69 Illness, unspecified: Secondary | ICD-10-CM | POA: Diagnosis not present

## 2019-02-01 DIAGNOSIS — N1832 Chronic kidney disease, stage 3b: Secondary | ICD-10-CM | POA: Diagnosis not present

## 2019-02-01 DIAGNOSIS — E875 Hyperkalemia: Secondary | ICD-10-CM | POA: Diagnosis not present

## 2019-02-01 DIAGNOSIS — E1122 Type 2 diabetes mellitus with diabetic chronic kidney disease: Secondary | ICD-10-CM | POA: Diagnosis not present

## 2019-02-01 DIAGNOSIS — I129 Hypertensive chronic kidney disease with stage 1 through stage 4 chronic kidney disease, or unspecified chronic kidney disease: Secondary | ICD-10-CM | POA: Diagnosis not present

## 2019-02-08 ENCOUNTER — Encounter: Payer: Self-pay | Admitting: Family Medicine

## 2019-02-09 ENCOUNTER — Telehealth: Payer: Self-pay | Admitting: Family Medicine

## 2019-02-09 MED ORDER — AMLODIPINE BESYLATE 5 MG PO TABS: 5.0000 mg | ORAL_TABLET | Freq: Every day | ORAL | 0 refills | Status: DC

## 2019-02-09 NOTE — Telephone Encounter (Signed)
Patient's wife advised. She came by the office.

## 2019-02-09 NOTE — Telephone Encounter (Signed)
MyChart Message         Subject Delivery         Non-Urgent Medical Question  02/08/2019 5:19 PM Reply   To: [facility]   From: [name]   Created: 02/08/2019 5:19 PM    *-*-*This message has not been handled.*-*-*   Patient is Bobby Thomas 1940-11-15 he has had a bad headache for 2 days. He thinks it is from stopping one of his medicines (Hydrochorothiazide 12.5) Dr. Juleen China stopped it on11/20/2020 . I would like you to [facility] [address] [facility]. I think it is something else if you need to see him please have your office call me at work [phone] to setup a new appt. He has taken Tylenol it has helped some. No other systems. Please let us know very soon.  [name]

## 2019-02-09 NOTE — Telephone Encounter (Signed)
Only reason stopping hctz would have anything to do with is if his BP has gone up. Have sent prescription [facility] [address] to take its place and bring down blood pressure. They need to check his BP daily and let me know what it is running at the end of the week.

## 2019-02-09 NOTE — Telephone Encounter (Signed)
Please see telephone message 

## 2019-02-15 ENCOUNTER — Other Ambulatory Visit: Payer: Self-pay | Admitting: Family Medicine

## 2019-02-15 NOTE — Telephone Encounter (Signed)
Medication Refill - Medication: clopidogrel (PLAVIX) 75 MG tablet    Has the patient contacted their pharmacy? Yes.   (Agent: If no, request that the patient contact the pharmacy for the refill.) (Agent: If yes, when and what did the pharmacy advise?)  Preferred Pharmacy (with phone number or street name):  [id number] [place], [place] [address]  [address] [place] [address]  Phone: [phone] Fax: [phone]  Not a 24 hour pharmacy; exact hours not known.     Agent: Please be advised that RX refills may take up to 3 business days. We ask that you follow-up with your pharmacy.

## 2019-03-01 DIAGNOSIS — I129 Hypertensive chronic kidney disease with stage 1 through stage 4 chronic kidney disease, or unspecified chronic kidney disease: Secondary | ICD-10-CM | POA: Diagnosis not present

## 2019-03-01 DIAGNOSIS — R809 Proteinuria, unspecified: Secondary | ICD-10-CM | POA: Insufficient documentation

## 2019-03-01 DIAGNOSIS — N1832 Chronic kidney disease, stage 3b: Secondary | ICD-10-CM | POA: Diagnosis not present

## 2019-03-01 DIAGNOSIS — E875 Hyperkalemia: Secondary | ICD-10-CM | POA: Diagnosis not present

## 2019-03-01 DIAGNOSIS — E1122 Type 2 diabetes mellitus with diabetic chronic kidney disease: Secondary | ICD-10-CM | POA: Diagnosis not present

## 2019-03-03 ENCOUNTER — Other Ambulatory Visit: Payer: Self-pay | Admitting: Family Medicine

## 2019-03-05 DIAGNOSIS — R69 Illness, unspecified: Secondary | ICD-10-CM | POA: Diagnosis not present

## 2019-03-08 ENCOUNTER — Other Ambulatory Visit: Payer: Self-pay | Admitting: Family Medicine

## 2019-03-08 NOTE — Telephone Encounter (Signed)
Requested Prescriptions  Pending Prescriptions Disp Refills  . amLODipine (NORVASC) 5 MG tablet [Pharmacy Med Name: AMLODIPINE BESYLATE 5 MG TAB] 60 tablet 1    Sig: TAKE 1 TABLET BY MOUTH EVERY DAY     Cardiovascular:  Calcium Channel Blockers Passed - 03/08/2019  1:17 PM      Passed - Last BP in normal range    BP Readings from Last 1 Encounters:  12/30/18 137/68         Passed - Valid encounter within last 6 months    Recent Outpatient Visits          3 months ago Hyperkalemia   [facility] Birdie Sons, MD   4 months 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[facility])   [facility] Birdie Sons, MD   8 months ago Annual physical exam   [facility] Birdie Sons, MD   1 year 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[facility])   [facility] Birdie Sons, MD   1 year ago Annual physical exam   [facility] Birdie Sons, MD      Future Appointments            In 4 months  [facility], [place]   In 4 months Fisher, Kirstie Peri, MD [facility], [place]

## 2019-04-03 ENCOUNTER — Other Ambulatory Visit: Payer: Self-pay | Admitting: Family Medicine

## 2019-04-29 DIAGNOSIS — E875 Hyperkalemia: Secondary | ICD-10-CM | POA: Diagnosis not present

## 2019-04-29 DIAGNOSIS — I129 Hypertensive chronic kidney disease with stage 1 through stage 4 chronic kidney disease, or unspecified chronic kidney disease: Secondary | ICD-10-CM | POA: Diagnosis not present

## 2019-04-29 DIAGNOSIS — E1122 Type 2 diabetes mellitus with diabetic chronic kidney disease: Secondary | ICD-10-CM | POA: Diagnosis not present

## 2019-04-29 DIAGNOSIS — N1832 Chronic kidney disease, stage 3b: Secondary | ICD-10-CM | POA: Diagnosis not present

## 2019-05-01 DIAGNOSIS — R69 Illness, unspecified: Secondary | ICD-10-CM | POA: Diagnosis not present

## 2019-05-01 DIAGNOSIS — Z7984 Long term (current) use of oral hypoglycemic drugs: Secondary | ICD-10-CM | POA: Diagnosis not present

## 2019-05-01 DIAGNOSIS — I10 Essential (primary) hypertension: Secondary | ICD-10-CM | POA: Diagnosis not present

## 2019-05-01 DIAGNOSIS — Z6830 Body mass index (BMI) 30.0-30.9, adult: Secondary | ICD-10-CM | POA: Diagnosis not present

## 2019-05-01 DIAGNOSIS — E119 Type 2 diabetes mellitus without complications: Secondary | ICD-10-CM | POA: Diagnosis not present

## 2019-05-01 DIAGNOSIS — E785 Hyperlipidemia, unspecified: Secondary | ICD-10-CM | POA: Diagnosis not present

## 2019-05-01 DIAGNOSIS — E669 Obesity, unspecified: Secondary | ICD-10-CM | POA: Diagnosis not present

## 2019-05-01 DIAGNOSIS — Z7982 Long term (current) use of aspirin: Secondary | ICD-10-CM | POA: Diagnosis not present

## 2019-05-01 DIAGNOSIS — I69328 Other speech and language deficits following cerebral infarction: Secondary | ICD-10-CM | POA: Diagnosis not present

## 2019-05-01 DIAGNOSIS — Z7902 Long term (current) use of antithrombotics/antiplatelets: Secondary | ICD-10-CM | POA: Diagnosis not present

## 2019-05-03 DIAGNOSIS — R69 Illness, unspecified: Secondary | ICD-10-CM | POA: Diagnosis not present

## 2019-05-24 DIAGNOSIS — E1122 Type 2 diabetes mellitus with diabetic chronic kidney disease: Secondary | ICD-10-CM | POA: Diagnosis not present

## 2019-05-24 DIAGNOSIS — N1832 Chronic kidney disease, stage 3b: Secondary | ICD-10-CM | POA: Diagnosis not present

## 2019-05-24 DIAGNOSIS — R809 Proteinuria, unspecified: Secondary | ICD-10-CM | POA: Diagnosis not present

## 2019-05-24 DIAGNOSIS — E875 Hyperkalemia: Secondary | ICD-10-CM | POA: Diagnosis not present

## 2019-05-24 DIAGNOSIS — I129 Hypertensive chronic kidney disease with stage 1 through stage 4 chronic kidney disease, or unspecified chronic kidney disease: Secondary | ICD-10-CM | POA: Diagnosis not present

## 2019-05-31 DIAGNOSIS — R69 Illness, unspecified: Secondary | ICD-10-CM | POA: Diagnosis not present

## 2019-06-25 DIAGNOSIS — E875 Hyperkalemia: Secondary | ICD-10-CM | POA: Diagnosis not present

## 2019-06-25 DIAGNOSIS — N1832 Chronic kidney disease, stage 3b: Secondary | ICD-10-CM | POA: Diagnosis not present

## 2019-06-25 DIAGNOSIS — R809 Proteinuria, unspecified: Secondary | ICD-10-CM | POA: Diagnosis not present

## 2019-06-25 DIAGNOSIS — I129 Hypertensive chronic kidney disease with stage 1 through stage 4 chronic kidney disease, or unspecified chronic kidney disease: Secondary | ICD-10-CM | POA: Diagnosis not present

## 2019-06-25 DIAGNOSIS — E1122 Type 2 diabetes mellitus with diabetic chronic kidney disease: Secondary | ICD-10-CM | POA: Diagnosis not present

## 2019-06-27 ENCOUNTER — Other Ambulatory Visit: Payer: Self-pay | Admitting: Family Medicine

## 2019-06-27 NOTE — Telephone Encounter (Signed)
Courtesy refill Requested Prescriptions  Pending Prescriptions Disp Refills  . metFORMIN (GLUCOPHAGE) 850 MG tablet [Pharmacy Med Name: METFORMIN HCL 850 MG TABLET] 180 tablet 0    Sig: TAKE 1 TABLET BY MOUTH TWICE A DAY WITH MEALS     Endocrinology:  Diabetes - Biguanides Failed - 06/27/2019  9:42 AM      Failed - Cr in normal range and within 360 days    Creatinine  Date Value Ref Range Status  05/20/2012 1.24 0.60 - 1.30 mg/dL Final   Creatinine, Ser  Date Value Ref Range Status  12/30/2018 1.71 (H) 0.61 - 1.24 mg/dL Final   Creatinine, POC  Date Value Ref Range Status  11/02/2018 n/a mg/dL Final         Failed - eGFR in normal range and within 360 days    EGFR (African American)  Date Value Ref Range Status  05/20/2012 >60  Final   GFR calc Af Amer  Date Value Ref Range Status  12/30/2018 44 (L) >60 mL/min Final   EGFR (Non-African Amer.)  Date Value Ref Range Status  05/20/2012 59 (L)  Final    Comment:    eGFR values <96m/min/1.73 m2 may be an indication of chronic kidney disease (CKD). Calculated eGFR is useful in patients with stable renal function. The eGFR calculation will not be reliable in acutely ill patients when serum creatinine is changing rapidly. It is not useful in  patients on dialysis. The eGFR calculation may not be applicable to patients at the low and high extremes of body sizes, pregnant women, and vegetarians.    GFR calc non Af Amer  Date Value Ref Range Status  12/30/2018 38 (L) >60 mL/min Final         Failed - Valid encounter within last 6 months    Recent Outpatient Visits          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   11 months [facility]   1 year 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   1 year [facility]      [facility]ments            [facility]  In 1 week Fisher[facility]           Passed - HBA1C is between 0 and 7.9 and within 180 days    Hgb A1c MFr Bld  Date Value Ref Range Status  12/30/2018 7.4 (H) 4.8 - 5.6 % Final    Comment:    (NOTE) Pre diabetes:          5.7%-6.4% Diabetes:              >6.4% Glycemic control for   <7.0% adults with diabetes

## 2019-06-28 DIAGNOSIS — I129 Hypertensive chronic kidney disease with stage 1 through stage 4 chronic kidney disease, or unspecified chronic kidney disease: Secondary | ICD-10-CM | POA: Diagnosis not present

## 2019-06-28 DIAGNOSIS — E875 Hyperkalemia: Secondary | ICD-10-CM | POA: Diagnosis not present

## 2019-06-28 DIAGNOSIS — N1832 Chronic kidney disease, stage 3b: Secondary | ICD-10-CM | POA: Diagnosis not present

## 2019-06-28 DIAGNOSIS — E1122 Type 2 diabetes mellitus with diabetic chronic kidney disease: Secondary | ICD-10-CM | POA: Diagnosis not present

## 2019-06-28 DIAGNOSIS — R809 Proteinuria, unspecified: Secondary | ICD-10-CM | POA: Diagnosis not present

## 2019-07-01 ENCOUNTER — Other Ambulatory Visit: Payer: Self-pay | Admitting: Family Medicine

## 2019-07-01 DIAGNOSIS — I779 Disorder of arteries and arterioles, unspecified: Secondary | ICD-10-CM

## 2019-07-03 IMAGING — US US SOFT TISSUE HEAD/NECK
1 series · 14 of 23 positions shown · non-contrast
Comparison: CT 10/23/2006

CLINICAL DATA: Right neck swelling intermittently for several
years.

EXAM:
ULTRASOUND OF HEAD/NECK SOFT TISSUES
TECHNIQUE: Ultrasound examination of the head and neck soft tissues was
performed in the area of clinical concern.

[Series 1: us soft tissue head/neck · 0.06mm/px · 23 acquisitions, 14 frames shown]
[im 1/23]
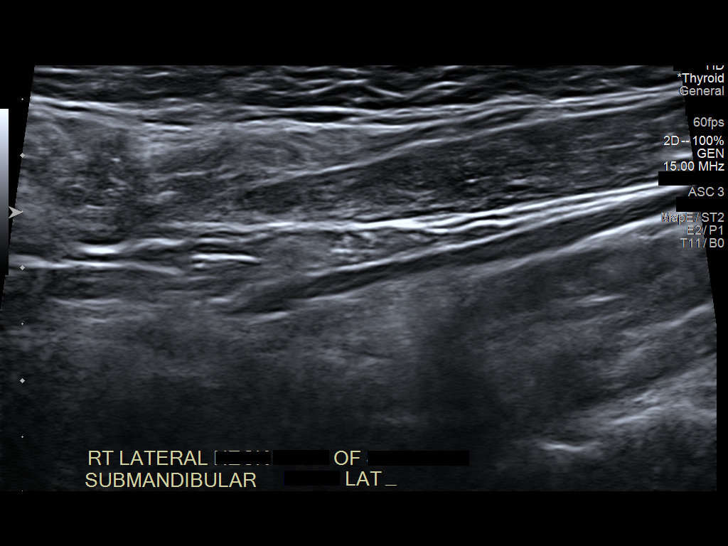
[im 3/23]
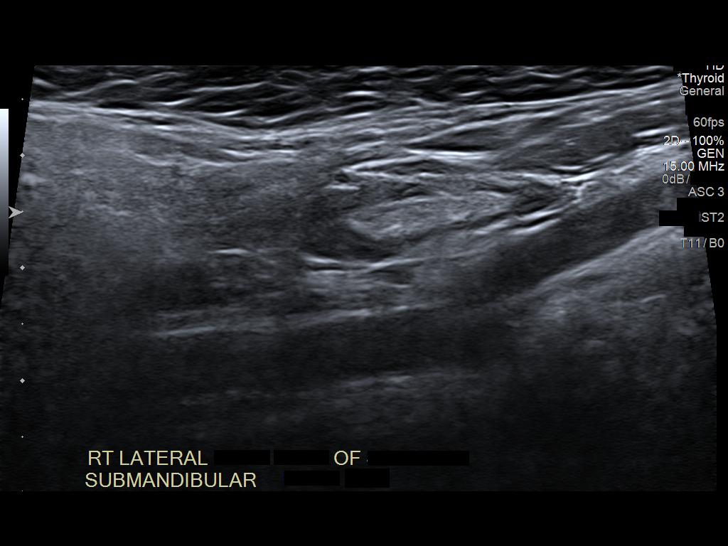
[im 5/23]
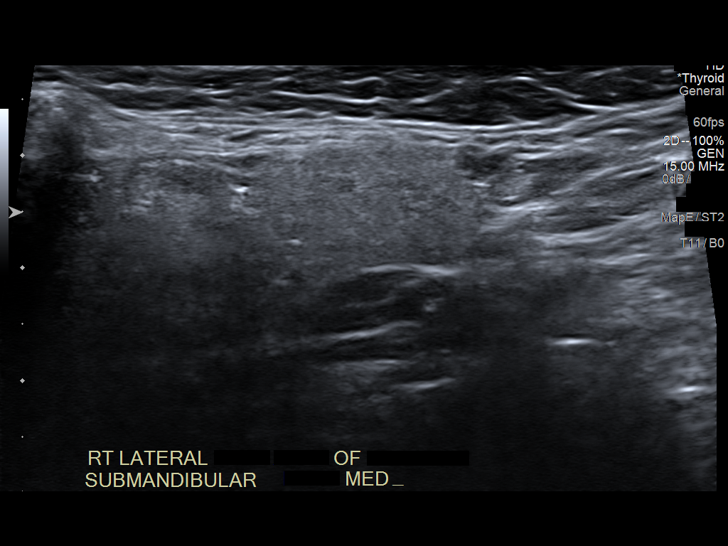
[im 6/23]
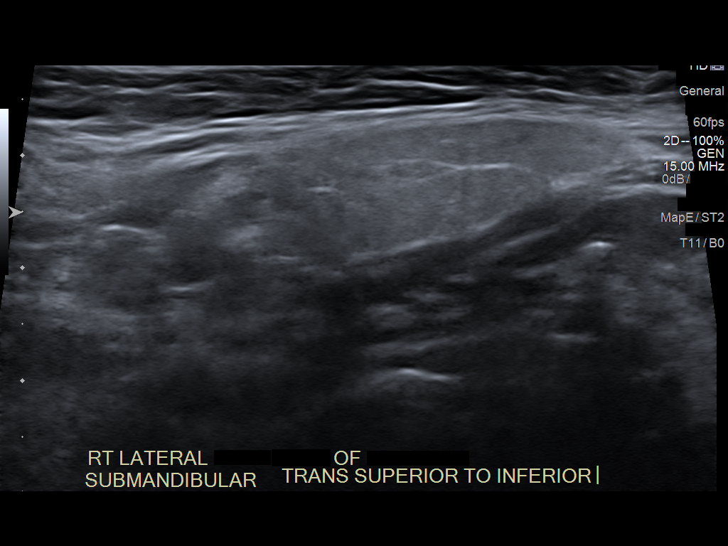
[im 8/23]
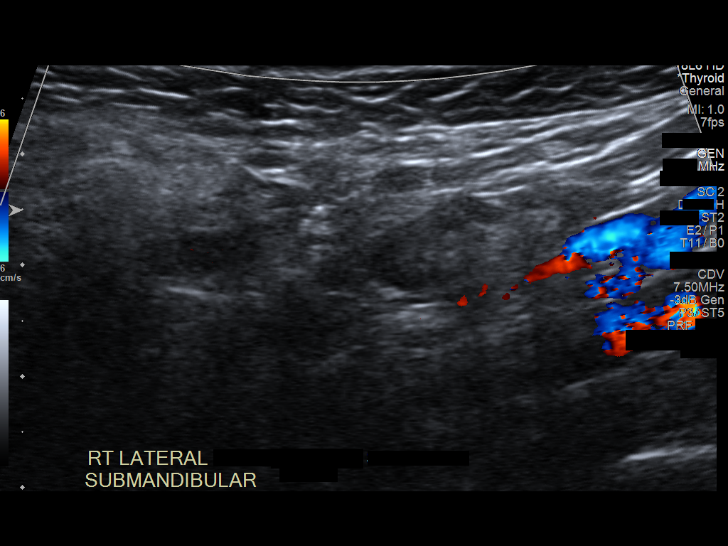
[im 10/23]
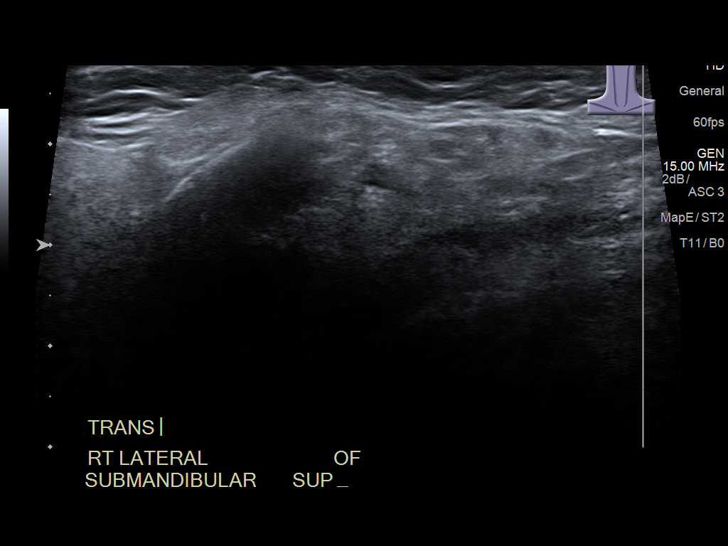
[im 11/23]
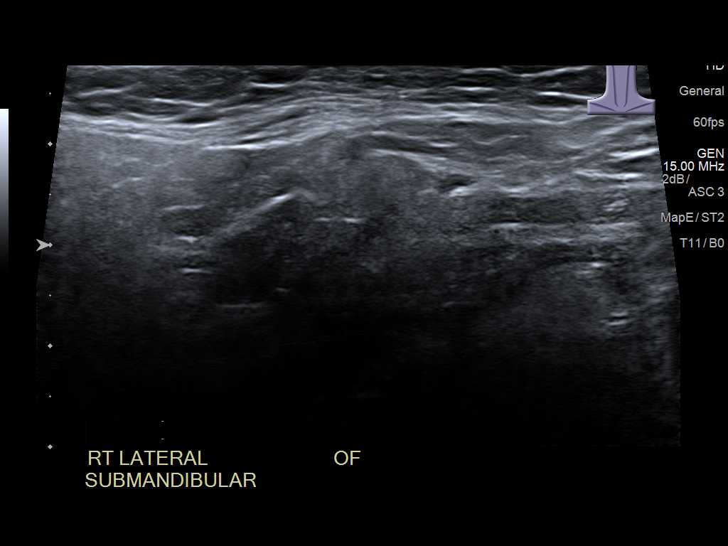
[im 13/23]
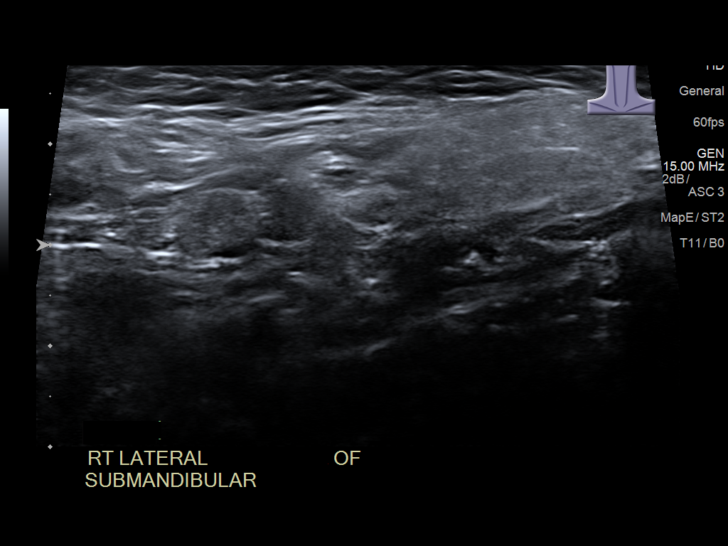
[im 14/23]
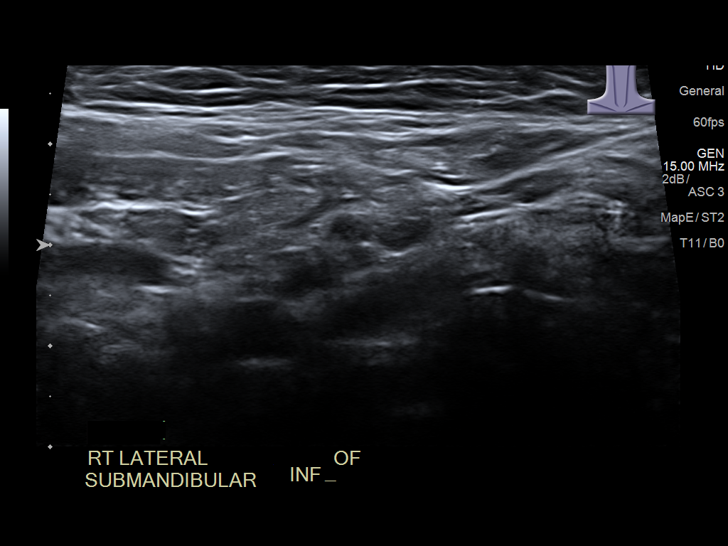
[im 16/23]
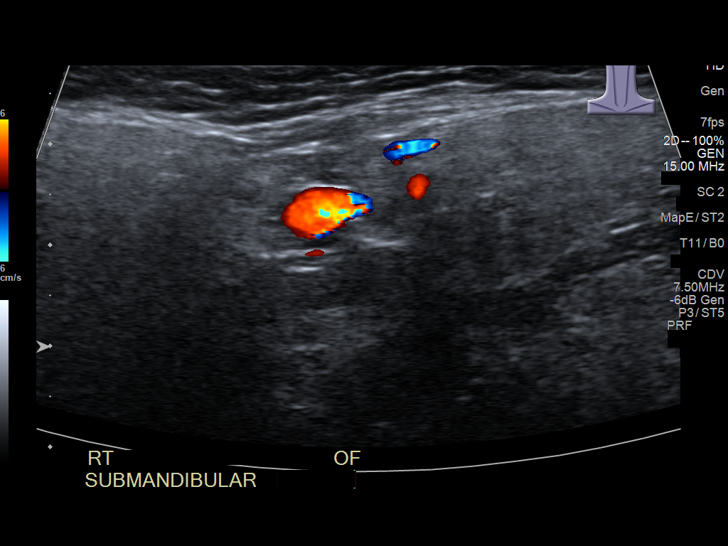
[im 18/23]
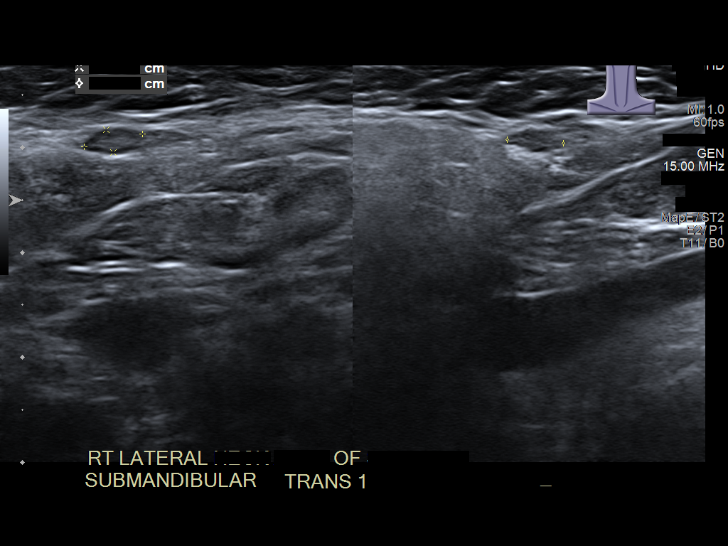
[im 19/23]
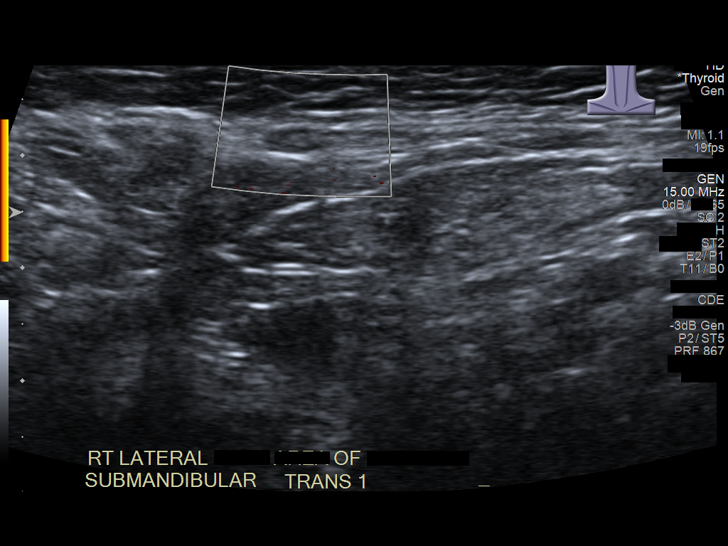
[im 21/23]
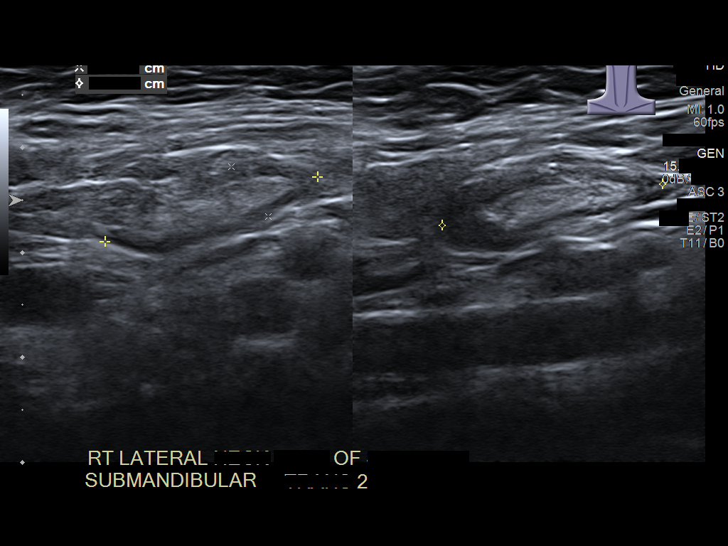
[im 23/23]
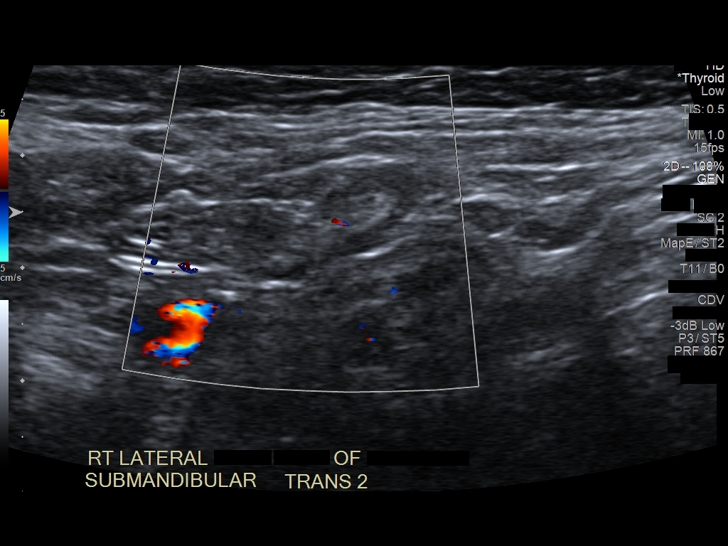

[14 of 23 positions shown; findings below may reference images not displayed]

FINDINGS: Ultrasound shows multiple infant nodes in the right level 2 to level
3 region, the largest measuring 2 x 2 x 0.6 mm. They appear to show
normal fatty hila. These nodes are asymmetric but not definitely
pathologic. Because this correlates with the area of clinical
concern, neck CT would be recommended to more accurately evaluate
these nodes and look for underlying causative lesion. The other
regional structures appear normal.
IMPRESSION: In the area of concern, there appear to be slightly prominent level
2 and level 3 lymph nodes. Whereas the appearance is not definitely
pathologic by ultrasound, the fact that these nodes correlate with
the area of clinical concern does raise the possibility that they
could be pathologic nodes. Consider chest CT with contrast to
evaluate the nodes more accurately and search for underlying lesion.

## 2019-07-09 ENCOUNTER — Other Ambulatory Visit: Payer: Self-pay

## 2019-07-09 ENCOUNTER — Ambulatory Visit: Payer: Medicare HMO

## 2019-07-09 ENCOUNTER — Ambulatory Visit (INDEPENDENT_AMBULATORY_CARE_PROVIDER_SITE_OTHER): Payer: Medicare HMO | Admitting: Family Medicine

## 2019-07-09 ENCOUNTER — Encounter: Payer: Self-pay | Admitting: Family Medicine

## 2019-07-09 VITALS — BP 112/62 | HR 62 | Temp 98.2°F | Ht 66.0 in | Wt 173.0 lb

## 2019-07-09 DIAGNOSIS — N1832 Chronic kidney disease, stage 3b: Secondary | ICD-10-CM | POA: Diagnosis not present

## 2019-07-09 DIAGNOSIS — I480 Paroxysmal atrial fibrillation: Secondary | ICD-10-CM

## 2019-07-09 DIAGNOSIS — I10 Essential (primary) hypertension: Secondary | ICD-10-CM | POA: Diagnosis not present

## 2019-07-09 DIAGNOSIS — Z0000 Encounter for general adult medical examination without abnormal findings: Secondary | ICD-10-CM

## 2019-07-09 DIAGNOSIS — I779 Disorder of arteries and arterioles, unspecified: Secondary | ICD-10-CM | POA: Diagnosis not present

## 2019-07-09 DIAGNOSIS — Z125 Encounter for screening for malignant neoplasm of prostate: Secondary | ICD-10-CM

## 2019-07-09 DIAGNOSIS — E1121 Type 2 diabetes mellitus with diabetic nephropathy: Secondary | ICD-10-CM

## 2019-07-09 DIAGNOSIS — E785 Hyperlipidemia, unspecified: Secondary | ICD-10-CM

## 2019-07-09 NOTE — Patient Instructions (Signed)

## 2019-07-09 NOTE — Progress Notes (Signed)
Annual Wellness Visit     [name], Male    DOB: August 24, 1940, 79 y.o.   MRN: [id number] Visit Date: 07/09/2019  Today's Provider: Lelon Huh, MD   Chief Complaint  Patient presents with  . Subsequent annual wellness visit   Subjective    Bobby Thomas is a 79 y.o. male who presents today for his Annual Wellness Visit. He reports consuming a low sodium diet. Home exercise routine includes walking 1 hrs per day. He generally feels fairly well. He reports sleeping fairly well. He does not have additional problems to discuss today.       Medications: Outpatient Medications Prior to Visit  Medication Sig  . aspirin EC 81 MG tablet Take 81 mg by mouth daily.  . clopidogrel (PLAVIX) 75 MG tablet TAKE 1 TABLET BY MOUTH EVERY DAY  . EPINEPHrine 0.3 mg/0.3 mL IJ SOAJ injection as needed.   . furosemide (LASIX) 40 MG tablet Take by mouth.  Marland Kitchen glipiZIDE (GLUCOTROL) 5 MG tablet TAKE 1 TABLET (5 MG TOTAL) BY MOUTH DAILY BEFORE BREAKFAST.  Marland Kitchen LORazepam (ATIVAN) 1 MG tablet TAKE 1/2 TO 1 TABLET BY MOUTH NIGHTLY AT BEDTIME  . metFORMIN (GLUCOPHAGE) 850 MG tablet TAKE 1 TABLET BY MOUTH TWICE A DAY WITH MEALS  . metoprolol tartrate (LOPRESSOR) 25 MG tablet TAKE 1 TABLET BY MOUTH TWICE A DAY  . simvastatin (ZOCOR) 20 MG tablet TAKE 1 TABLET BY MOUTH EVERY DAY  . amLODipine (NORVASC) 5 MG tablet TAKE 1 TABLET BY MOUTH EVERY DAY  . losartan (COZAAR) 50 MG tablet TAKE 1 TABLET BY MOUTH EVERY DAY   No facility-administered medications prior to visit.    Allergies  Allergen Reactions  . Hydrocodone-Guaifenesin Shortness Of Breath and Other (See Comments)    Not able to sit still when using Codiclear DH  . Bee Venom Other (See Comments)  . Codeine     Other reaction(s): Pruritus  . Morphine Sulfate     Other reaction(s): Pruritus  . Warfarin Sodium Diarrhea    Patient Care Team: Birdie Sons, MD as PCP - General (Family Medicine) Lucky Cowboy, Erskine Squibb, MD as Referring  Physician (Vascular Surgery) Anell Barr, [place] (Optometry) Cammy Copa, [facility] as [place] Management (Pharmacist) Ralene Bathe, MD as Consulting Physician (Dermatology) Manya Silvas, MD (Inactive) as Consulting Physician (Gastroenterology) Concepcion Elk., MD (Dentistry)     Objective    Vitals: BP 112/62 (BP Location: Right Arm, Patient Position: Sitting, Cuff Size: Large)   Pulse 62   Temp 98.2 F (36.8 C) (Temporal)   Ht 5\' 6"  (1.676 m)   Wt 173 lb (78.5 kg)   SpO2 97%   BMI 27.92 kg/m      Most recent functional status assessment: In your present state of health, do you have any difficulty performing the following activities: 07/09/2019  Hearing? N  Vision? N  Difficulty concentrating or making decisions? N  Walking or climbing stairs? N  Dressing or bathing? N  Doing errands, shopping? N  Some recent data might be hidden    Most recent fall risk assessment: Fall Risk  07/09/2019  Falls in the past year? 0  Number falls in past yr: 0  Injury with Fall? 0  Comment -     Most recent depression screenings: PHQ 2/9 Scores 07/09/2019 07/07/2018  PHQ - 2 Score 0 0  PHQ- 9 Score 0 1    Most recent cognitive screening: 6CIT Screen  07/09/2019  What Year? 0 points  What month? 0 points  What time? 0 points  Count back from 20 4 points  Months in reverse 4 points  Repeat phrase 10 points  Total Score 18    No results found for any visits on 07/09/19.  Assessment & Plan     Annual wellness visit done today including the all of the following: Reviewed patient's Family Medical History Reviewed and updated list of patient's medical providers Assessment of cognitive impairment was done Assessed patient's functional ability Established a written schedule for health screening [place] Completed and Reviewed  Exercise Activities and Dietary recommendations Goals    . DIET - INCREASE WATER INTAKE      Recommend increasing water intake to 4-6 glasses a day.        Immunization History  Administered Date(s) Administered  . Fluad Quad(high Dose 65+) 11/02/2018  . Influenza Split 11/09/2010, 11/06/2011  . Influenza, High Dose Seasonal PF 11/09/2013, 11/24/2014, 10/28/2015, 11/09/2016, 10/18/2017  . Influenza,inj,Quad PF,6+ Mos 12/02/2012  . PFIZER SARS-COV-2 Vaccination 03/12/2019, 04/02/2019  . Pneumococcal Conjugate-13 11/09/2013  . Pneumococcal Polysaccharide-23 11/03/2007  . Tdap 05/13/2011  . Zoster 05/13/2011  . Zoster Recombinat (Shingrix) 10/23/2017, 01/07/2018    Health Maintenance  Topic Date Due  . FOOT EXAM  Never done  . HEMOGLOBIN A1C  06/29/2019  . INFLUENZA VACCINE  09/05/2019  . COLONOSCOPY  10/03/2019  . OPHTHALMOLOGY EXAM  10/05/2019  . TETANUS/TDAP  05/12/2021  . COVID-19 Vaccine  Completed  . PNA vac Low Risk Adult  Completed     Discussed health benefits of physical activity, and encouraged him to engage in regular exercise appropriate for his age and condition.      No follow-ups on file.     The entirety of the information documented in the History of Present Illness, Review of Systems and Physical Exam were personally obtained by me. Portions of this information were initially documented by the CMA and reviewed by me for thoroughness and accuracy.      Lelon Huh, MD  [facility] [phone] (phone) [phone] (fax)  [place]

## 2019-07-09 NOTE — Progress Notes (Signed)
Annual Wellness Visit     [name], Male    DOB: 21-Aug-1940, 79 y.o.   MRN: [id number] Visit Date: 07/09/2019  Today's Provider: Lelon Huh, MD   Chief Complaint  Patient presents with  . Complete physical   Subjective    Bobby Thomas is a 79 y.o. male who presents today for his complete physical exam He reports consuming a general diet. Pt exercises regularly. He generally feels well. He reports sleeping well. He does not have additional problems to discuss today.   HPI Hypertension, follow-up  BP Readings from Last 3 Encounters:  07/09/19 112/62  12/30/18 137/68  11/18/18 (!) 148/74   Wt Readings from Last 3 Encounters:  07/09/19 173 lb (78.5 kg)  12/29/18 179 lb (81.2 kg)  11/18/18 178 lb 6.4 oz (80.9 kg)     He was last seen for hypertension 8 months ago.  BP at that visit was 148/74. Management since that visit includes adding Amlodipine on 02/09/2019. Pt stopped taking amlodipine  He reports excellent compliance with treatment. He is not having side effects.  He is following a Low Sodium diet. He is exercising. He does not smoke.  Use of agents associated with hypertension: NSAIDS.   Outside blood pressures are checked occasionally. Symptoms: No chest pain No chest pressure  No palpitations No syncope  No dyspnea No orthopnea  No paroxysmal nocturnal dyspnea No lower extremity edema   Pertinent labs: Lab Results  Component Value Date   CHOL 130 07/07/2018   HDL 53 07/07/2018   LDLCALC 54 07/07/2018   TRIG 115 07/07/2018   CHOLHDL 2.5 07/07/2018   Lab Results  Component Value Date   NA 142 12/30/2018   K 4.4 12/30/2018   CREATININE 1.71 (H) 12/30/2018   GFRNONAA 38 (L) 12/30/2018   GFRAA 44 (L) 12/30/2018   GLUCOSE 159 (H) 12/30/2018     The 10-year ASCVD risk score Mikey Bussing DC Jr., et al., 2013) is: 37.2%   ---------------------------------------------------------------------------------------------------  Diabetes  Mellitus Type II, Follow-up  Lab Results  Component Value Date   HGBA1C 7.4 (H) 12/30/2018   HGBA1C 8.2 (A) 11/02/2018   HGBA1C 7.8 (H) 07/07/2018   Wt Readings from Last 3 Encounters:  07/09/19 173 lb (78.5 kg)  12/29/18 179 lb (81.2 kg)  11/18/18 178 lb 6.4 oz (80.9 kg)   Last seen for diabetes 8 months ago.  Management since then includes continuing same medication. He reports excellent compliance with treatment. He is not having side effects.  Symptoms: No fatigue No foot ulcerations  No appetite changes No nausea  No paresthesia of the feet  No polydipsia  No polyuria No visual disturbances   No vomiting     Home blood sugar records: fasting range: Low-mid 100's  Episodes of hypoglycemia? No    Current insulin regiment:  Most Recent Eye Exam: 10/05/2018 Current exercise: walking Current diet habits: in general, a "healthy" diet    Pertinent Labs: Lab Results  Component Value Date   CHOL 130 07/07/2018   HDL 53 07/07/2018   LDLCALC 54 07/07/2018   TRIG 115 07/07/2018   CHOLHDL 2.5 07/07/2018   Lab Results  Component Value Date   NA 142 12/30/2018   K 4.4 12/30/2018   CREATININE 1.71 (H) 12/30/2018   GFRNONAA 38 (L) 12/30/2018   GFRAA 44 (L) 12/30/2018   GLUCOSE 159 (H) 12/30/2018     ---------------------------------------------------------------------------------------------------  Lipid/Cholesterol, Follow-up  Last lipid panel Other pertinent labs  Lab  Results  Component Value Date   CHOL 130 07/07/2018   HDL 53 07/07/2018   LDLCALC 54 07/07/2018   TRIG 115 07/07/2018   CHOLHDL 2.5 07/07/2018   Lab Results  Component Value Date   ALT 15 12/29/2018   AST 28 12/29/2018   PLT 273 12/30/2018   TSH 1.670 11/02/2018     He was last seen for this 1 years ago.  Management since that visit includes continue same medication.  He reports excellent compliance with treatment. He is not having side effects.   Symptoms: No chest pain No chest  pressure/discomfort  No dyspnea No lower extremity edema  No numbness or tingling of extremity No orthopnea  No palpitations No paroxysmal nocturnal dyspnea  No speech difficulty No syncope   Current diet: in general, a "healthy" diet     The 10-year ASCVD risk score Mikey Bussing DC Jr., et al., 2013) is: 37.2%  ---------------------------------------------------------------------------------------------------     Medications: Outpatient Medications Prior to Visit  Medication Sig  . aspirin EC 81 MG tablet Take 81 mg by mouth daily.  . clopidogrel (PLAVIX) 75 MG tablet TAKE 1 TABLET BY MOUTH EVERY DAY  . EPINEPHrine 0.3 mg/0.3 mL IJ SOAJ injection as needed.   . furosemide (LASIX) 40 MG tablet Take by mouth.  Marland Kitchen glipiZIDE (GLUCOTROL) 5 MG tablet TAKE 1 TABLET (5 MG TOTAL) BY MOUTH DAILY BEFORE BREAKFAST.  Marland Kitchen LORazepam (ATIVAN) 1 MG tablet TAKE 1/2 TO 1 TABLET BY MOUTH NIGHTLY AT BEDTIME  . metFORMIN (GLUCOPHAGE) 850 MG tablet TAKE 1 TABLET BY MOUTH TWICE A DAY WITH MEALS  . metoprolol tartrate (LOPRESSOR) 25 MG tablet TAKE 1 TABLET BY MOUTH TWICE A DAY  . simvastatin (ZOCOR) 20 MG tablet TAKE 1 TABLET BY MOUTH EVERY DAY  . amLODipine (NORVASC) 5 MG tablet TAKE 1 TABLET BY MOUTH EVERY DAY  . losartan (COZAAR) 50 MG tablet TAKE 1 TABLET BY MOUTH EVERY DAY   No facility-administered medications prior to visit.    Allergies  Allergen Reactions  . Hydrocodone-Guaifenesin Shortness Of Breath and Other (See Comments)    Not able to sit still when using Codiclear DH  . Bee Venom Other (See Comments)  . Codeine     Other reaction(s): Pruritus  . Morphine Sulfate     Other reaction(s): Pruritus  . Warfarin Sodium Diarrhea      Review of Systems  Constitutional: Negative.  Negative for appetite change, chills and fever.  HENT: Negative.   Eyes: Negative.   Respiratory: Negative.  Negative for chest tightness, shortness of breath and wheezing.   Cardiovascular: Negative for chest pain  and palpitations.  Gastrointestinal: Negative.  Negative for abdominal pain, nausea and vomiting.  Endocrine: Negative.   Genitourinary: Positive for frequency. Negative for decreased urine volume, difficulty urinating, discharge, dysuria, enuresis, flank pain, genital sores, hematuria, penile pain, penile swelling, scrotal swelling, testicular pain and urgency.  Musculoskeletal: Negative.   Skin: Negative.   Allergic/Immunologic: Negative.   Neurological: Negative.   Hematological: Negative.   Psychiatric/Behavioral: Negative.       Objective    Vitals: BP 112/62 (BP Location: Right Arm, Patient Position: Sitting, Cuff Size: Large)   Pulse 62   Temp 98.2 F (36.8 C) (Temporal)   Ht 5\' 6"  (1.676 m)   Wt 173 lb (78.5 kg)   SpO2 97%   BMI 27.92 kg/m    Physical Exam  General Appearance:     Overweight male. Alert, cooperative, in no acute distress, appears stated  age  Head:    Normocephalic, without obvious abnormality, atraumatic  Eyes:    PERRL, conjunctiva/corneas clear, EOM's intact, fundi    benign, both eyes       Ears:    Normal TM's and external ear canals, both ears  Neck:   Supple, symmetrical, trachea midline, no adenopathy;       thyroid:  No enlargement/tenderness/nodules; no carotid   bruit or JVD  Back:     Symmetric, no curvature, ROM normal, no CVA tenderness  Lungs:     Clear to auscultation bilaterally, respirations unlabored  Chest wall:    No tenderness or deformity  Heart:    Normal heart rate. Irregularly irregular rhythm. No murmurs, rubs, or gallops.  S1 and S2 normal  Abdomen:     Soft, non-tender, bowel sounds active all four quadrants,    no masses, no organomegaly  Genitalia:    deferred  Rectal:    deferred  Extremities:   All extremities are intact. No cyanosis or edema  Pulses:   2+ and symmetric all extremities  Skin:   Skin color, texture, turgor normal, no rashes or lesions  Lymph nodes:   Cervical, supraclavicular, and axillary nodes  normal  Neurologic:   CNII-XII intact. Normal strength, sensation and reflexes      throughout     No results found for any visits on 07/09/19.  Assessment & Plan     Annual wellness visit done today including the all of the following: Reviewed patient's Family Medical History Reviewed and updated list of patient's medical providers Assessment of cognitive impairment was done Assessed patient's functional ability Established a written schedule for health screening [place] Completed and Reviewed  Exercise Activities and Dietary recommendations Goals    . DIET - INCREASE WATER INTAKE     Recommend increasing water intake to 4-6 glasses a day.        Immunization History  Administered Date(s) Administered  . Fluad Quad(high Dose 65+) 11/02/2018  . Influenza Split 11/09/2010, 11/06/2011  . Influenza, High Dose Seasonal PF 11/09/2013, 11/24/2014, 10/28/2015, 11/09/2016, 10/18/2017  . Influenza,inj,Quad PF,6+ Mos 12/02/2012  . PFIZER SARS-COV-2 Vaccination 03/12/2019, 04/02/2019  . Pneumococcal Conjugate-13 11/09/2013  . Pneumococcal Polysaccharide-23 11/03/2007  . Tdap 05/13/2011  . Zoster 05/13/2011  . Zoster Recombinat (Shingrix) 10/23/2017, 01/07/2018    Health Maintenance  Topic Date Due  . FOOT EXAM  Never done  . HEMOGLOBIN A1C  06/29/2019  . INFLUENZA VACCINE  09/05/2019  . COLONOSCOPY  10/03/2019  . OPHTHALMOLOGY EXAM  10/05/2019  . TETANUS/TDAP  05/12/2021  . COVID-19 Vaccine  Completed  . PNA vac Low Risk Adult  Completed     Discussed health benefits of physical activity, and encouraged him to engage in regular exercise appropriate for his age and condition.    1. Hyperlipidemia, unspecified hyperlipidemia type He is tolerating simvastatin well with no adverse effects.   - Lipid panel  2. Essential hypertension Well controlled.  Continue current medications.    3. 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(HCC)  - Hemoglobin  A1c  4. Stage 3b chronic kidney disease Followed by [facility] - CBC - Renal function panel  5. Carotid artery disease, unspecified laterality, unspecified type ([place]) Previously followed by Dr. Lucky Cowboy, but missed appt last year due to Covid  6. Intermittent atrial fibrillation (HCC) Refused NOAC and warfarin which he has taken in past but did not tolerating. Is on ASA and clopidogrel   7. Prostate cancer screening  -  PSA Total (Reflex To Free) ([facility] only)   Future Appointments  Date Time Provider [place]  07/29/2019  8:30 AM Ralene Bathe, MD [facility] None  01/10/2020  8:00 AM Caryn Section Kirstie Peri, MD [facility]      The entirety of the information documented in the History of Present Illness, Review of Systems and Physical Exam were personally obtained by me. Portions of this information were initially documented by the CMA and reviewed by me for thoroughness and accuracy.      Lelon Huh, MD  [facility] [phone] (phone) [phone] (fax)  [place]

## 2019-07-10 DIAGNOSIS — R69 Illness, unspecified: Secondary | ICD-10-CM | POA: Diagnosis not present

## 2019-07-10 LAB — CBC
Hematocrit: 42.9 % (ref 37.5–51.0)
Hemoglobin: 14.7 g/dL (ref 13.0–17.7)
MCH: 30.5 pg (ref 26.6–33.0)
MCHC: 34.3 g/dL (ref 31.5–35.7)
MCV: 89 fL (ref 79–97)
Platelets: 308 10*3/uL (ref 150–450)
RBC: 4.82 x10E6/uL (ref 4.14–5.80)
RDW: 13.2 % (ref 11.6–15.4)
WBC: 11.8 10*3/uL — ABNORMAL HIGH (ref 3.4–10.8)

## 2019-07-10 LAB — LIPID PANEL
Chol/HDL Ratio: 3.1 ratio (ref 0.0–5.0)
Cholesterol, Total: 148 mg/dL (ref 100–199)
HDL: 48 mg/dL (ref >39)
LDL Chol Calc (NIH): 68 mg/dL (ref 0–99)
Triglycerides: 192 mg/dL — ABNORMAL HIGH (ref 0–149)
VLDL Cholesterol Cal: 32 mg/dL (ref 5–40)

## 2019-07-10 LAB — RENAL FUNCTION PANEL
Albumin: 4.8 g/dL — ABNORMAL HIGH (ref 3.7–4.7)
BUN/Creatinine Ratio: 24 (ref 10–24)
BUN: 47 mg/dL — ABNORMAL HIGH (ref 8–27)
CO2: 24 mmol/L (ref 20–29)
Calcium: 11.2 mg/dL — ABNORMAL HIGH (ref 8.6–10.2)
Chloride: 95 mmol/L — ABNORMAL LOW (ref 96–106)
Creatinine, Ser: 2 mg/dL — ABNORMAL HIGH (ref 0.76–1.27)
GFR calc Af Amer: 36 mL/min/{1.73_m2} — ABNORMAL LOW (ref >59)
GFR calc non Af Amer: 31 mL/min/{1.73_m2} — ABNORMAL LOW (ref >59)
Glucose: 163 mg/dL — ABNORMAL HIGH (ref 65–99)
Phosphorus: 4.2 mg/dL — ABNORMAL HIGH (ref 2.8–4.1)
Potassium: 5.9 mmol/L — ABNORMAL HIGH (ref 3.5–5.2)
Sodium: 138 mmol/L (ref 134–144)

## 2019-07-10 LAB — HEMOGLOBIN A1C
Est. average glucose Bld gHb Est-mCnc: 160 mg/dL
Hgb A1c MFr Bld: 7.2 % — ABNORMAL HIGH (ref 4.8–5.6)

## 2019-07-10 LAB — PSA TOTAL (REFLEX TO FREE): Prostate Specific Ag, Serum: 0.2 ng/mL (ref 0.0–4.0)

## 2019-07-12 ENCOUNTER — Other Ambulatory Visit: Payer: Self-pay | Admitting: Family Medicine

## 2019-07-12 DIAGNOSIS — I779 Disorder of arteries and arterioles, unspecified: Secondary | ICD-10-CM

## 2019-07-28 ENCOUNTER — Other Ambulatory Visit: Payer: Self-pay | Admitting: Family Medicine

## 2019-07-28 DIAGNOSIS — E1121 Type 2 diabetes mellitus with diabetic nephropathy: Secondary | ICD-10-CM

## 2019-07-28 NOTE — Telephone Encounter (Signed)
Requested Prescriptions  Pending Prescriptions Disp Refills  . glipiZIDE (GLUCOTROL) 5 MG tablet [Pharmacy Med Name: GLIPIZIDE 5 MG TABLET] 90 tablet 1    Sig: TAKE 1 TABLET (5 MG TOTAL) BY MOUTH DAILY BEFORE BREAKFAST.     Endocrinology:  Diabetes - Sulfonylureas Passed - 07/28/2019  1:24 AM      Passed - HBA1C is between 0 and 7.9 and within 180 days    Hgb A1c MFr Bld  Date Value Ref Range Status  07/09/2019 7.2 (H) 4.8 - 5.6 % Final    Comment:             Prediabetes: 5.7 - 6.4          Diabetes: >6.4          Glycemic control for adults with diabetes: <7.0          Passed - Valid encounter within last 6 months    Recent Outpatient Visits          2 weeks ago Annual physical exam   [facility] Birdie Sons, MD   8 months ago Hyperkalemia   [facility] Birdie Sons, MD   8 months 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[facility])   [facility] Birdie Sons, MD   1 year ago Annual physical exam   [facility] Birdie Sons, MD   1 year 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[facility])   [facility] Birdie Sons, MD      Future Appointments            In 5 months Fisher, Kirstie Peri, MD [facility], [place]

## 2019-07-29 ENCOUNTER — Ambulatory Visit: Payer: Medicare HMO | Admitting: Dermatology

## 2019-07-29 ENCOUNTER — Other Ambulatory Visit: Payer: Self-pay

## 2019-07-29 DIAGNOSIS — D1800 Hemangioma unspecified site: Secondary | ICD-10-CM | POA: Diagnosis not present

## 2019-07-29 DIAGNOSIS — L570 Actinic keratosis: Secondary | ICD-10-CM

## 2019-07-29 DIAGNOSIS — D044 Carcinoma in situ of skin of scalp and neck: Secondary | ICD-10-CM | POA: Diagnosis not present

## 2019-07-29 DIAGNOSIS — Z86007 Personal history of in-situ neoplasm of skin: Secondary | ICD-10-CM | POA: Diagnosis not present

## 2019-07-29 DIAGNOSIS — L578 Other skin changes due to chronic exposure to nonionizing radiation: Secondary | ICD-10-CM

## 2019-07-29 DIAGNOSIS — D489 Neoplasm of uncertain behavior, unspecified: Secondary | ICD-10-CM

## 2019-07-29 NOTE — Progress Notes (Signed)
Follow-Up Visit   Subjective  Bobby Thomas is a 79 y.o. male who presents for the following: TBSE. The patient presents for Total-Body Skin Exam (TBSE) for skin cancer screening and mole check.  Patient presents today for annual TBSE, has a few areas on top on head he would like to have evaluated. Patient has a history of SCCis 2008,2015,2019,2020  The following portions of the chart were reviewed this encounter and updated as appropriate:  Tobacco  Allergies  Meds  Problems  Med Hx  Surg Hx  Fam Hx      Review of Systems:  No other skin or systemic complaints except as noted in HPI or Assessment and Plan.  Objective  Well appearing patient in no apparent distress; mood and affect are within normal limits.  A full examination was performed including scalp, head, eyes, ears, nose, lips, neck, chest, axillae, abdomen, back, buttocks, bilateral upper extremities, bilateral lower extremities, hands, feet, fingers, toes, fingernails, and toenails. All findings within normal limits unless otherwise noted below.  Objective  Scalp: Erythematous thin papules/macules with gritty scale.   Objective  Right Postauricular Area: 1.2 cm pink papule Right Scalp post auricular   Assessment & Plan    AK (actinic keratosis) Scalp  Cryotherapy today Prior to procedure, discussed risks of blister formation, small wound, skin dyspigmentation, or rare scar following cryotherapy.    Destruction of lesion - Scalp Complexity: simple   Destruction method: cryotherapy   Informed consent: discussed and consent obtained   Timeout:  patient name, date of birth, surgical site, and procedure verified Lesion destroyed using liquid nitrogen: Yes   Region frozen until ice ball extended beyond lesion: Yes   Outcome: patient tolerated procedure well with no complications   Post-procedure details: wound care instructions given    Neoplasm of uncertain behavior Right scalp/ Postauricular  Area  Epidermal / dermal shaving  Lesion diameter (cm):  1.2 Informed consent: discussed and consent obtained   Timeout: patient name, date of birth, surgical site, and procedure verified   Procedure prep:  Patient was prepped and draped in usual sterile fashion Prep type:  Isopropyl alcohol Anesthesia: the lesion was anesthetized in a standard fashion   Anesthetic:  1% lidocaine w/ epinephrine 1-100,000 buffered w/ 8.4% NaHCO3 Instrument used: flexible razor blade   Hemostasis achieved with: pressure, aluminum chloride and electrodesiccation   Outcome: patient tolerated procedure well   Post-procedure details: sterile dressing applied and wound care instructions given   Dressing type: bandage and petrolatum   Additional details:  1.6 cm post treatment defect  Destruction of lesion Complexity: extensive   Destruction method: electrodesiccation and curettage   Informed consent: discussed and consent obtained   Timeout:  patient name, date of birth, surgical site, and procedure verified Procedure prep:  Patient was prepped and draped in usual sterile fashion Prep type:  Isopropyl alcohol Anesthesia: the lesion was anesthetized in a standard fashion   Anesthetic:  1% lidocaine w/ epinephrine 1-100,000 buffered w/ 8.4% NaHCO3 Curettage performed in three different directions: Yes   Electrodesiccation performed over the curetted area: Yes   Lesion length (cm):  1.2 Lesion width (cm):  1.2 Margin per side (cm):  0.2 Final wound size (cm):  1.6 Hemostasis achieved with:  pressure, aluminum chloride and electrodesiccation Outcome: patient tolerated procedure well with no complications   Post-procedure details: sterile dressing applied and wound care instructions given   Dressing type: bandage and petrolatum    Specimen 1 - Surgical pathology Differential  Diagnosis: R/O SCCis Check Margins: No 0.7 cm pink papule  Shave removal and ED&C  Actinic Damage - diffuse scaly erythematous  macules with underlying dyspigmentation - Recommend daily broad spectrum sunscreen SPF 30+ to sun-exposed areas, reapply every 2 hours as needed.  - Call for new or changing lesions.  Hemangiomas - Red papules - Discussed benign nature - Observe - Call for any changes  History of Squamous Cell Carcinoma in Situ of the Skin - No evidence of recurrence today - Recommend regular full body skin exams - Recommend daily broad spectrum sunscreen SPF 30+ to sun-exposed areas, reapply every 2 hours as needed.  - Call if any new or changing lesions are noted between office visits   Return in about 3 months (around 10/29/2019).   Marene Lenz, CMA, am acting as scribe for Sarina Ser, MD . Documentation: I have reviewed the above documentation for accuracy and completeness, and I agree with the above.  Sarina Ser, MD

## 2019-07-29 NOTE — Patient Instructions (Addendum)
Recommend daily broad spectrum sunscreen SPF 30+ to sun-exposed areas, reapply every 2 hours as needed. Call for new or changing lesions.  Prior to procedure, discussed risks of blister formation, small wound, skin dyspigmentation, or rare scar following cryotherapy.   Cryotherapy Aftercare  . Wash gently with soap and water everyday.   Marland Kitchen Apply Vaseline and Band-Aid daily until healed.   Wound Care Instructions  1. Cleanse wound gently with soap and water once a day then pat dry with clean gauze. Apply a thing coat of Petrolatum (petroleum jelly, "Vaseline") over the wound (unless you have an allergy to this). We recommend that you use a new, sterile tube of Vaseline. Do not pick or remove scabs. Do not remove the yellow or white "healing tissue" from the base of the wound.  2. Cover the wound with fresh, clean, nonstick gauze and secure with paper tape. You may use Band-Aids in place of gauze and tape if the would is small enough, but would recommend trimming much of the tape off as there is often too much. Sometimes Band-Aids can irritate the skin.  3. You should call the office for your biopsy report after 1 week if you have not already been contacted.  4. If you experience any problems, such as abnormal amounts of bleeding, swelling, significant bruising, significant pain, or evidence of infection, please call the office immediately.  5. FOR ADULT SURGERY PATIENTS: If you need something for pain relief you may take 1 extra strength Tylenol (acetaminophen) AND 2 Ibuprofen (200mg  each) together every 4 hours as needed for pain. (do not take these if you are allergic to them or if you have a reason you should not take them.) Typically, you may only need pain medication for 1 to 3 days.

## 2019-08-02 ENCOUNTER — Telehealth: Payer: Self-pay

## 2019-08-02 NOTE — Telephone Encounter (Signed)
-----   Message from Ralene Bathe, MD sent at 08/02/2019 10:36 AM EDT ----- Skin , right postauricular area SQUAMOUS CELL CARCINOMA IN SITU  Cancer - SCC in situ Superficial Already treated Recheck next visit

## 2019-08-02 NOTE — Telephone Encounter (Signed)
Discussed biopsy results with pt wife

## 2019-08-04 ENCOUNTER — Encounter: Payer: Self-pay | Admitting: Dermatology

## 2019-08-07 ENCOUNTER — Other Ambulatory Visit: Payer: Self-pay | Admitting: Family Medicine

## 2019-08-07 NOTE — Telephone Encounter (Signed)
Requested Prescriptions  Pending Prescriptions Disp Refills   clopidogrel (PLAVIX) 75 MG tablet [Pharmacy Med Name: CLOPIDOGREL 75 MG TABLET] 90 tablet 1    Sig: TAKE 1 TABLET BY MOUTH EVERY DAY     Hematology: Antiplatelets - clopidogrel Failed - 08/07/2019  9:51 AM      Failed - Evaluate AST, ALT within 2 months of therapy initiation.      Passed - ALT in normal range and within 360 days    ALT  Date Value Ref Range Status  12/29/2018 15 0 - 44 U/L Final   SGPT (ALT)  Date Value Ref Range Status  05/20/2012 20 12 - 78 U/L Final         Passed - AST in normal range and within 360 days    AST  Date Value Ref Range Status  12/29/2018 28 15 - 41 U/L Final   SGOT(AST)  Date Value Ref Range Status  03/04/2011 15 15 - 37 Unit/L Final         Passed - HCT in normal range and within 180 days    Hematocrit  Date Value Ref Range Status  07/09/2019 42.9 37.5 - 51.0 % Final         Passed - HGB in normal range and within 180 days    Hemoglobin  Date Value Ref Range Status  07/09/2019 14.7 13.0 - 17.7 g/dL Final         Passed - PLT in normal range and within 180 days    Platelets  Date Value Ref Range Status  07/09/2019 308 150 - 450 x10E3/uL Final         Passed - Valid encounter within last 6 months    Recent Outpatient Visits          4 weeks ago Annual physical exam   [facility] Birdie Sons, MD   8 months ago Hyperkalemia   [facility] Birdie Sons, MD   9 months 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[facility])   [facility] Birdie Sons, MD   1 year ago Annual physical exam   [facility] Birdie Sons, MD   1 year 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[facility])   [facility] Birdie Sons, MD      Future Appointments            In 2 months Ralene Bathe, MD [place]   In 5 months Fisher, Kirstie Peri, MD [facility], [place]

## 2019-08-12 ENCOUNTER — Other Ambulatory Visit: Payer: Self-pay | Admitting: Family Medicine

## 2019-08-28 DIAGNOSIS — R69 Illness, unspecified: Secondary | ICD-10-CM | POA: Diagnosis not present

## 2019-08-30 ENCOUNTER — Ambulatory Visit (INDEPENDENT_AMBULATORY_CARE_PROVIDER_SITE_OTHER): Payer: Medicare HMO | Admitting: Nurse Practitioner

## 2019-08-30 ENCOUNTER — Encounter (INDEPENDENT_AMBULATORY_CARE_PROVIDER_SITE_OTHER): Payer: Self-pay | Admitting: Nurse Practitioner

## 2019-08-30 ENCOUNTER — Other Ambulatory Visit: Payer: Self-pay

## 2019-08-30 ENCOUNTER — Ambulatory Visit (INDEPENDENT_AMBULATORY_CARE_PROVIDER_SITE_OTHER): Payer: Medicare HMO

## 2019-08-30 VITALS — BP 132/72 | HR 80 | Resp 16 | Wt 179.0 lb

## 2019-08-30 DIAGNOSIS — E1122 Type 2 diabetes mellitus with diabetic chronic kidney disease: Secondary | ICD-10-CM | POA: Diagnosis not present

## 2019-08-30 DIAGNOSIS — E875 Hyperkalemia: Secondary | ICD-10-CM | POA: Diagnosis not present

## 2019-08-30 DIAGNOSIS — I10 Essential (primary) hypertension: Secondary | ICD-10-CM | POA: Diagnosis not present

## 2019-08-30 DIAGNOSIS — N1832 Chronic kidney disease, stage 3b: Secondary | ICD-10-CM | POA: Diagnosis not present

## 2019-08-30 DIAGNOSIS — R809 Proteinuria, unspecified: Secondary | ICD-10-CM | POA: Diagnosis not present

## 2019-08-30 DIAGNOSIS — I6529 Occlusion and stenosis of unspecified carotid artery: Secondary | ICD-10-CM | POA: Diagnosis not present

## 2019-08-30 DIAGNOSIS — E785 Hyperlipidemia, unspecified: Secondary | ICD-10-CM | POA: Diagnosis not present

## 2019-08-30 DIAGNOSIS — I129 Hypertensive chronic kidney disease with stage 1 through stage 4 chronic kidney disease, or unspecified chronic kidney disease: Secondary | ICD-10-CM | POA: Diagnosis not present

## 2019-08-30 DIAGNOSIS — I6521 Occlusion and stenosis of right carotid artery: Secondary | ICD-10-CM | POA: Diagnosis not present
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
## 2019-09-30 DIAGNOSIS — E119 Type 2 diabetes mellitus without complications: Secondary | ICD-10-CM | POA: Diagnosis not present

## 2019-09-30 DIAGNOSIS — H2513 Age-related nuclear cataract, bilateral: Secondary | ICD-10-CM | POA: Diagnosis not present

## 2019-09-30 LAB — HM DIABETES EYE EXAM

## 2019-10-01 ENCOUNTER — Encounter: Payer: Self-pay | Admitting: Family Medicine

## 2019-10-12 DIAGNOSIS — R809 Proteinuria, unspecified: Secondary | ICD-10-CM | POA: Diagnosis not present

## 2019-10-12 DIAGNOSIS — I129 Hypertensive chronic kidney disease with stage 1 through stage 4 chronic kidney disease, or unspecified chronic kidney disease: Secondary | ICD-10-CM | POA: Diagnosis not present

## 2019-10-12 DIAGNOSIS — E875 Hyperkalemia: Secondary | ICD-10-CM | POA: Diagnosis not present

## 2019-10-12 DIAGNOSIS — E1122 Type 2 diabetes mellitus with diabetic chronic kidney disease: Secondary | ICD-10-CM | POA: Diagnosis not present

## 2019-10-12 DIAGNOSIS — N1832 Chronic kidney disease, stage 3b: Secondary | ICD-10-CM | POA: Diagnosis not present

## 2019-10-19 ENCOUNTER — Encounter: Payer: Self-pay | Admitting: Family Medicine

## 2019-10-22 DIAGNOSIS — R69 Illness, unspecified: Secondary | ICD-10-CM | POA: Diagnosis not present

## 2019-10-26 ENCOUNTER — Encounter: Payer: Self-pay | Admitting: Family Medicine

## 2019-11-04 ENCOUNTER — Ambulatory Visit: Payer: Medicare HMO | Admitting: Dermatology

## 2019-11-07 DIAGNOSIS — R69 Illness, unspecified: Secondary | ICD-10-CM | POA: Diagnosis not present

## 2019-11-10 ENCOUNTER — Other Ambulatory Visit: Payer: Self-pay | Admitting: Family Medicine

## 2019-11-10 NOTE — Telephone Encounter (Signed)
Requested medication (s) are due for refill today: yes  Requested medication (s) are on the active medication list: yes  Last refill:  02/15/19 #90 3 refills   Future visit scheduled: yes   Notes to clinic:  not delegated per protocol     Requested Prescriptions  Pending Prescriptions Disp Refills   LORazepam (ATIVAN) 1 MG tablet [Pharmacy Med Name: LORAZEPAM 1 MG TABLET] 90 tablet     Sig: TAKE 1/2 TO 1 TABLET BY MOUTH NIGHTLY AT BEDTIME      Not Delegated - Psychiatry:  Anxiolytics/Hypnotics Failed - 11/10/2019  7:23 PM      Failed - This refill cannot be delegated      Failed - Urine Drug Screen completed in last 360 days.      Passed - Valid encounter within last 6 months    Recent Outpatient Visits           4 months ago Annual physical exam   [facility] Birdie Sons, MD   11 months ago Hyperkalemia   [facility] Birdie Sons, MD   1 year 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[facility])   [facility] Birdie Sons, MD   1 year ago Annual physical exam   [facility] Birdie Sons, MD   1 year 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[facility])   [facility] Birdie Sons, MD       Future Appointments             In 2 months Fisher, Kirstie Peri, MD [facility] [place]   In 3 months Ralene Bathe, MD [place]

## 2019-11-24 ENCOUNTER — Other Ambulatory Visit: Payer: Self-pay | Admitting: Family Medicine

## 2019-11-24 DIAGNOSIS — E1121 Type 2 diabetes mellitus with diabetic nephropathy: Secondary | ICD-10-CM

## 2019-11-24 NOTE — Telephone Encounter (Signed)
Requested medications are due for refill today?    Uncertain  - this diabetic supply is no longer on patient's active medication.    Requested medications are on active medication list?  No   Last Refill:  Uncertain -  Was discontinued in August 2020   Future visit scheduled?  Yes in two months  Notes to Clinic:  Please see above.

## 2019-11-25 NOTE — Telephone Encounter (Signed)
I called patient's wife to confirm that patient uses this specific brand test strip and if so, does he need a refill. Patients wife was at work and was unsure. She says she would check and call us back once she gets home.

## 2019-11-26 DIAGNOSIS — R69 Illness, unspecified: Secondary | ICD-10-CM | POA: Diagnosis not present

## 2019-11-27 ENCOUNTER — Other Ambulatory Visit: Payer: Self-pay | Admitting: Family Medicine

## 2019-11-27 NOTE — Telephone Encounter (Signed)
Requested Prescriptions  Pending Prescriptions Disp Refills  . metoprolol tartrate (LOPRESSOR) 25 MG tablet [Pharmacy Med Name: METOPROLOL TARTRATE 25 MG TAB] 180 tablet 0    Sig: TAKE 1 TABLET BY MOUTH TWICE A DAY     Cardiovascular:  Beta Blockers Passed - 11/27/2019  8:54 AM      Passed - Last BP in normal range    BP Readings from Last 1 Encounters:  08/30/19 (!) 132/72         Passed - Last Heart Rate in normal range    Pulse Readings from Last 1 Encounters:  08/30/19 80         Passed - Valid encounter within last 6 months    Recent Outpatient Visits          4 months ago Annual physical exam   [facility] Birdie Sons, MD   1 year ago Hyperkalemia   [facility] Birdie Sons, MD   1 year 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[facility])   [facility] Birdie Sons, MD   1 year ago Annual physical exam   [facility] Birdie Sons, MD   1 year 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[facility])   [facility] Birdie Sons, MD      Future Appointments            In 1 month Fisher, Kirstie Peri, MD [facility] [place]   In 2 months Ralene Bathe, MD [place]

## 2019-12-23 ENCOUNTER — Ambulatory Visit: Payer: Medicare HMO | Admitting: Dermatology

## 2020-01-10 ENCOUNTER — Encounter: Payer: Self-pay | Admitting: Family Medicine

## 2020-01-10 ENCOUNTER — Ambulatory Visit (INDEPENDENT_AMBULATORY_CARE_PROVIDER_SITE_OTHER): Payer: Medicare HMO | Admitting: Family Medicine

## 2020-01-10 ENCOUNTER — Other Ambulatory Visit: Payer: Self-pay

## 2020-01-10 VITALS — BP 129/74 | HR 76 | Temp 98.0°F | Resp 16 | Wt 180.0 lb

## 2020-01-10 DIAGNOSIS — I480 Paroxysmal atrial fibrillation: Secondary | ICD-10-CM

## 2020-01-10 DIAGNOSIS — E1121 Type 2 diabetes mellitus with diabetic nephropathy: Secondary | ICD-10-CM | POA: Diagnosis not present

## 2020-01-10 DIAGNOSIS — N1832 Chronic kidney disease, stage 3b: Secondary | ICD-10-CM | POA: Diagnosis not present

## 2020-01-10 LAB — POCT GLYCOSYLATED HEMOGLOBIN (HGB A1C)
Est. average glucose Bld gHb Est-mCnc: 163
Hemoglobin A1C: 7.3 % — AB (ref 4.0–5.6)

## 2020-01-10 NOTE — Progress Notes (Signed)
Established patient visit   [name]   DOB: 06/28/1940   79 y.o. Male  MRN: [id number] Visit Date: 01/10/2020  Today's healthcare provider: Lelon Huh, MD   Chief Complaint  Patient presents with  . Diabetes  . Hypertension   Subjective    HPI  Diabetes Mellitus Type II, Follow-up  Lab Results  Component Value Date   HGBA1C 7.3 (A) 01/10/2020   HGBA1C 7.2 (H) 07/09/2019   HGBA1C 7.4 (H) 12/30/2018   Wt Readings from Last 3 Encounters:  01/10/20 180 lb (81.6 kg)  08/30/19 179 lb (81.2 kg)  07/09/19 173 lb (78.5 kg)   Last seen for diabetes 6 months ago.  Management since then includes continue same medication. He reports good compliance with treatment. He is not having side effects.  Symptoms: No fatigue No foot ulcerations  No appetite changes No nausea  No paresthesia of the feet  No polydipsia  No polyuria No visual disturbances   No vomiting     Home blood sugar records: fasting range: 120-140  Episodes of hypoglycemia? No    Current insulin regiment: none Most Recent Eye Exam: 09/30/2019 Current exercise: walking Current diet habits: in general, a "healthy" diet    Pertinent Labs: Lab Results  Component Value Date   CHOL 148 07/09/2019   HDL 48 07/09/2019   LDLCALC 68 07/09/2019   TRIG 192 (H) 07/09/2019   CHOLHDL 3.1 07/09/2019   Lab Results  Component Value Date   NA 138 07/09/2019   K 5.9 (H) 07/09/2019   CREATININE 2.00 (H) 07/09/2019   GFRNONAA 31 (L) 07/09/2019   GFRAA 36 (L) 07/09/2019   GLUCOSE 163 (H) 07/09/2019     ---------------------------------------------------------------------------------------------------  Hypertension, follow-up  BP Readings from Last 3 Encounters:  01/10/20 129/74  08/30/19 (!) 132/72  07/09/19 112/62   Wt Readings from Last 3 Encounters:  01/10/20 180 lb (81.6 kg)  08/30/19 179 lb (81.2 kg)  07/09/19 173 lb (78.5 kg)     He was last seen for hypertension 6 months ago.   BP at that visit was 112/62. Management since that visit includes advising patient to drink more water to help improve kidney functions. Since last visit, Losartan was discontinued by his nephrologist.   He reports good compliance with treatment. He is not having side effects.  He is following a Regular diet. He is exercising. He does not smoke.  Use of agents associated with hypertension: NSAIDS.   Outside blood pressures are 117/79. Symptoms: No chest pain No chest pressure  No palpitations No syncope  No dyspnea No orthopnea  No paroxysmal nocturnal dyspnea No lower extremity edema   Pertinent labs: Lab Results  Component Value Date   CHOL 148 07/09/2019   HDL 48 07/09/2019   LDLCALC 68 07/09/2019   TRIG 192 (H) 07/09/2019   CHOLHDL 3.1 07/09/2019   Lab Results  Component Value Date   NA 138 07/09/2019   K 5.9 (H) 07/09/2019   CREATININE 2.00 (H) 07/09/2019   GFRNONAA 31 (L) 07/09/2019   GFRAA 36 (L) 07/09/2019   GLUCOSE 163 (H) 07/09/2019     The 10-year ASCVD risk score Mikey Bussing DC Jr., et al., 2013) is: 50.2%   ---------------------------------------------------------------------------------------------------     Medications: Outpatient Medications Prior to Visit  Medication Sig  . aspirin EC 81 MG tablet Take 81 mg by mouth daily.  . clopidogrel (PLAVIX) 75 MG tablet TAKE 1 TABLET BY MOUTH EVERY DAY  . EPINEPHrine  0.3 mg/0.3 mL IJ SOAJ injection as needed.   . furosemide (LASIX) 40 MG tablet Take by mouth.  Marland Kitchen glipiZIDE (GLUCOTROL) 5 MG tablet TAKE 1 TABLET (5 MG TOTAL) BY MOUTH DAILY BEFORE BREAKFAST.  Marland Kitchen LORazepam (ATIVAN) 1 MG tablet TAKE 1/2 TO 1 TABLET BY MOUTH NIGHTLY AT BEDTIME  . metFORMIN (GLUCOPHAGE) 850 MG tablet TAKE 1 TABLET BY MOUTH TWICE A DAY WITH MEALS  . metoprolol tartrate (LOPRESSOR) 25 MG tablet TAKE 1 TABLET BY MOUTH TWICE A DAY  . ONETOUCH ULTRA test strip USE TO CHECK BLOOD SUGAR DAILY FOR TYPE 2 DIABETES DX: E11.9  . simvastatin  (ZOCOR) 20 MG tablet TAKE 1 TABLET BY MOUTH EVERY DAY  . amLODipine (NORVASC) 5 MG tablet TAKE 1 TABLET BY MOUTH EVERY DAY (Patient not taking: Reported on 01/10/2020)  . [DISCONTINUED] losartan (COZAAR) 50 MG tablet TAKE 1 TABLET BY MOUTH EVERY DAY   No facility-administered medications prior to visit.    Review of Systems  Constitutional: Negative for appetite change, chills and fever.  Respiratory: Negative for chest tightness, shortness of breath and wheezing.   Cardiovascular: Negative for chest pain and palpitations.  Gastrointestinal: Negative for abdominal pain, nausea and vomiting.      Objective    BP 129/74 (BP Location: Left Arm, Patient Position: Sitting, Cuff Size: Normal)   Pulse 76   Temp 98 F (36.7 C) (Oral)   Resp 16   Wt 180 lb (81.6 kg)   BMI 29.05 kg/m    Physical Exam   General: Appearance:     Overweight male in no acute distress  Eyes:    PERRL, conjunctiva/corneas clear, EOM's intact       Lungs:     Clear to auscultation bilaterally, respirations unlabored  Heart:    Normal heart rate. Irregularly irregular rhythm. No murmurs, rubs, or gallops.   MS:   All extremities are intact.   Neurologic:   Awake, alert, oriented x 3. No apparent focal neurological           defect.         Results for orders placed or performed in visit on 01/10/20  POCT HgB A1C  Result Value Ref Range   Hemoglobin A1C 7.3 (A) 4.0 - 5.6 %   Est. average glucose Bld gHb Est-mCnc 163     Assessment & Plan     1. 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([place]) .fairly well controlled, Continue current medications.  Recheck a1c 81months  2. Intermittent atrial fibrillation (HCC) He continue to refuse warfarin due to adverse effects and NOACs due to cost. He continues on ASA and clopidogrel   3. Stage 3b chronic kidney disease (HCC) Stable, continue regular follow up Dr. Juleen China.         The entirety of the information documented in the History of Present Illness, Review  of Systems and Physical Exam were personally obtained by me. Portions of this information were initially documented by the CMA and reviewed by me for thoroughness and accuracy.      Lelon Huh, MD  [facility] [phone] (phone) [phone] (fax)  [place]

## 2020-01-10 NOTE — Patient Instructions (Signed)
.   Please review the attached list of medications and notify my office if there are any errors.   . Please bring all of your medications to every appointment so we can make sure that our medication list is the same as yours.   

## 2020-01-11 IMAGING — US US SOFT TISSUE HEAD/NECK
1 series · 14 of 24 positions shown · non-contrast
Comparison: ultrasound neck 07/15/2017.

CLINICAL DATA: Lump right neck

EXAM:
ULTRASOUND OF HEAD/NECK SOFT TISSUES
TECHNIQUE: Ultrasound examination of the head and neck soft tissues was
performed in the area of clinical concern.

[Series 1: us soft tissue head/neck · 0.06mm/px · 24 acquisitions, 14 frames shown]
[im 1/24]
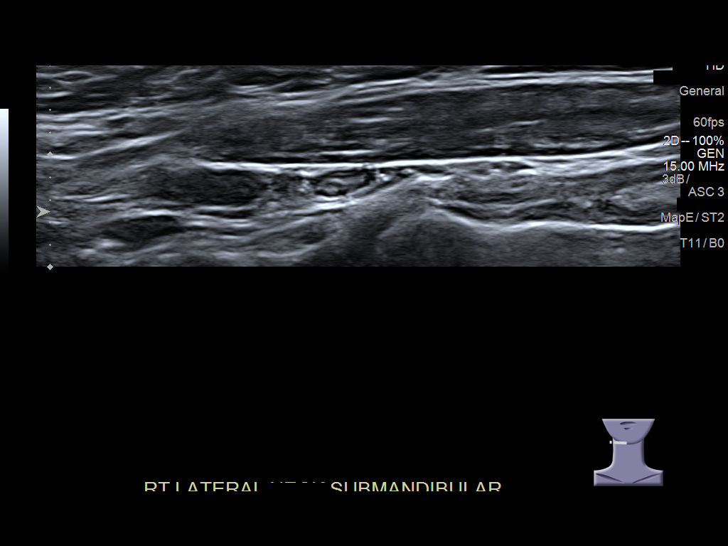
[im 3/24]
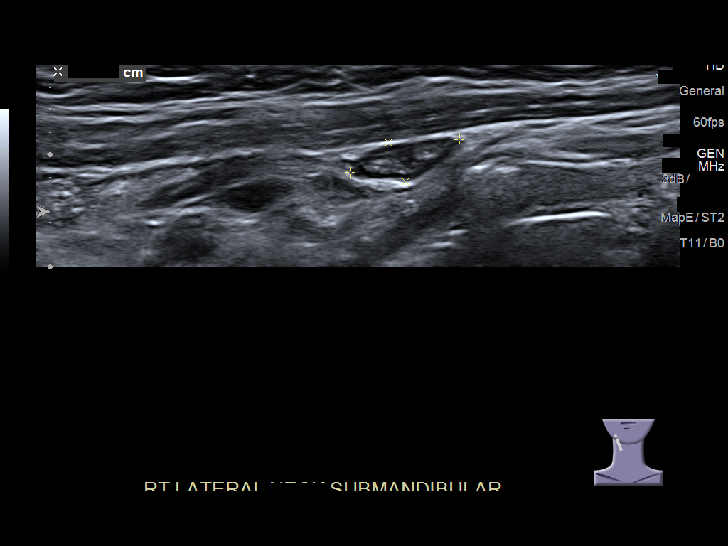
[im 5/24]
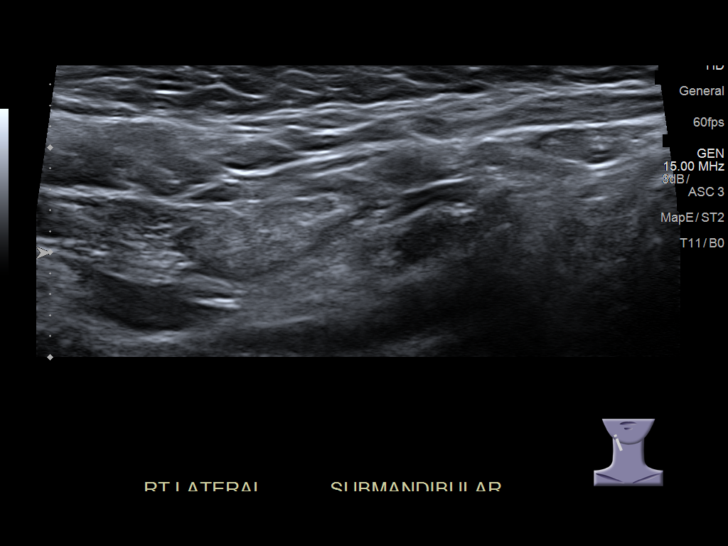
[im 7/24]
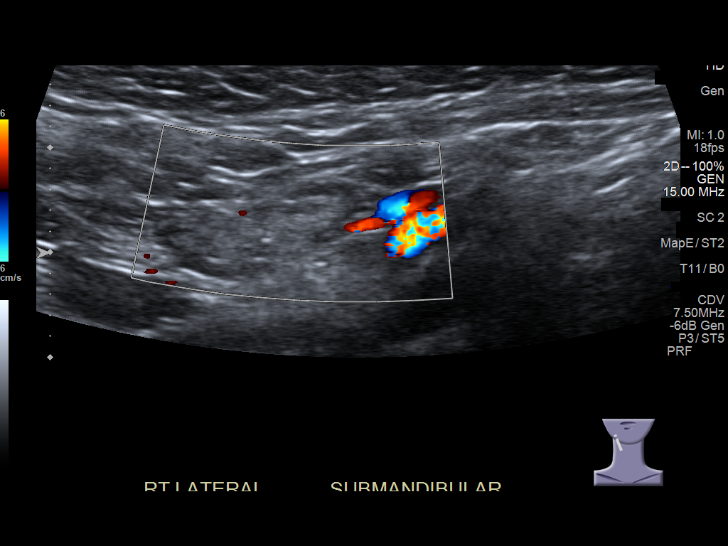
[im 8/24]
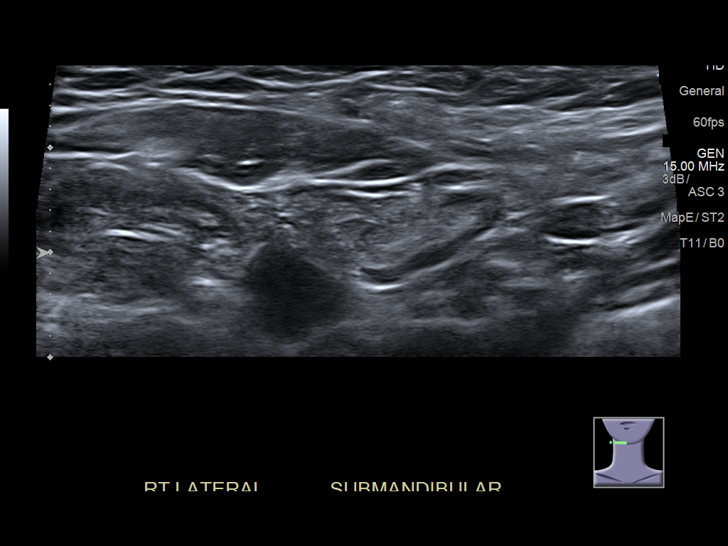
[im 10/24]
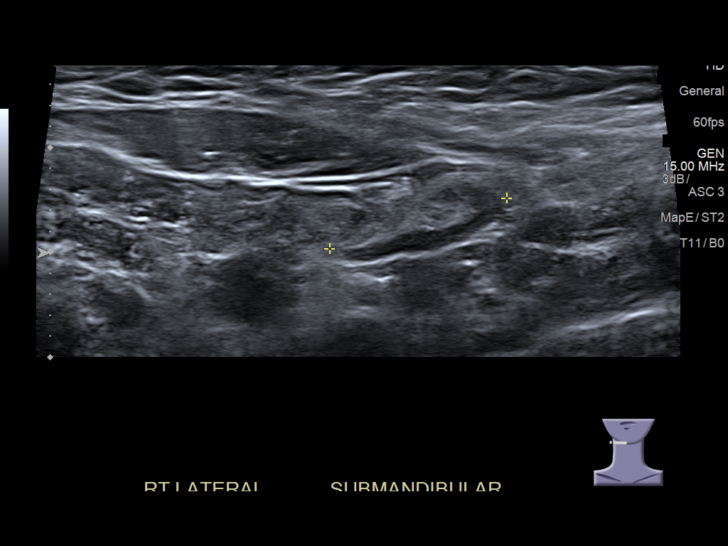
[im 12/24]
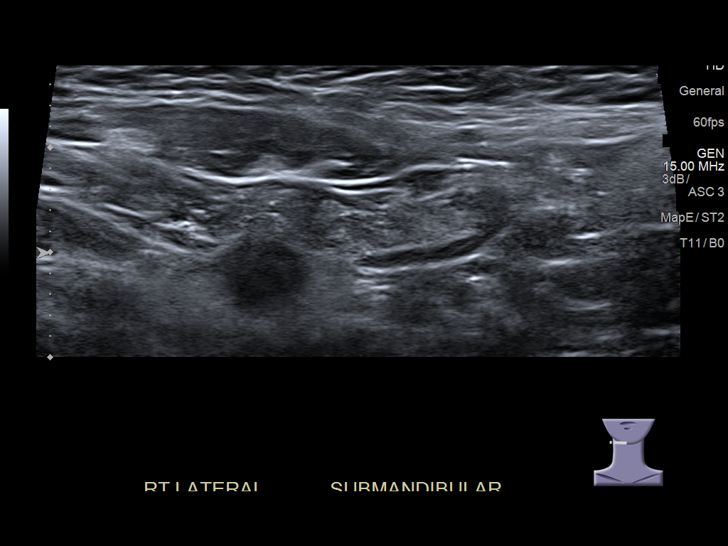
[im 13/24]
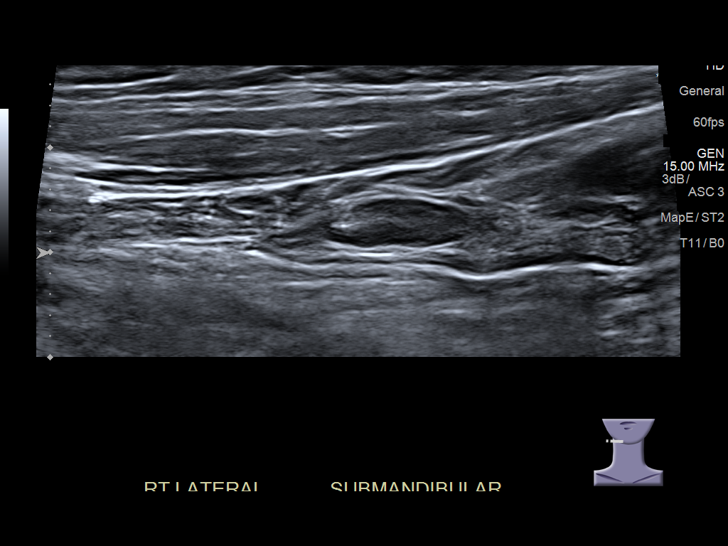
[im 15/24]
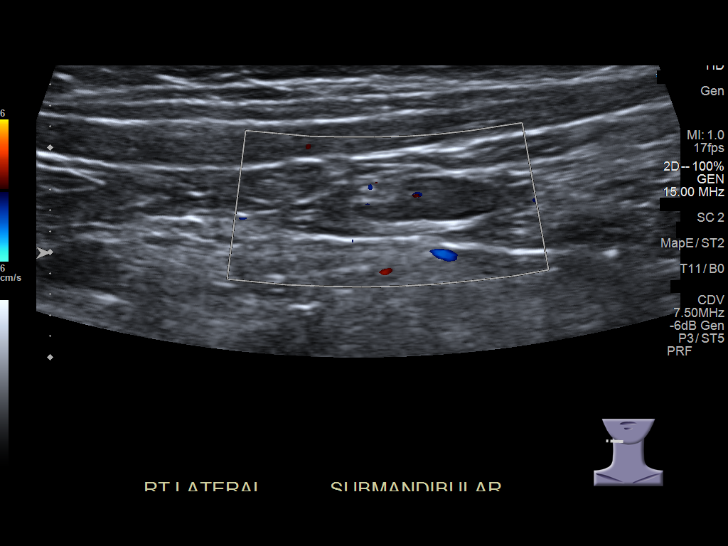
[im 17/24]
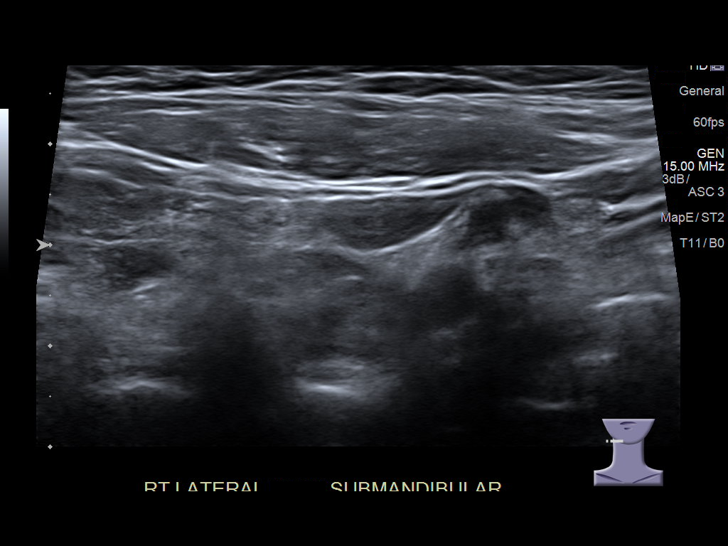
[im 19/24]
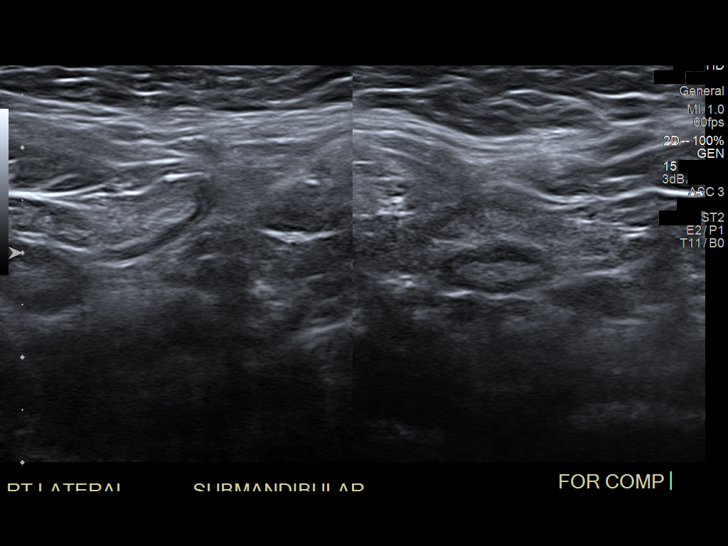
[im 20/24]
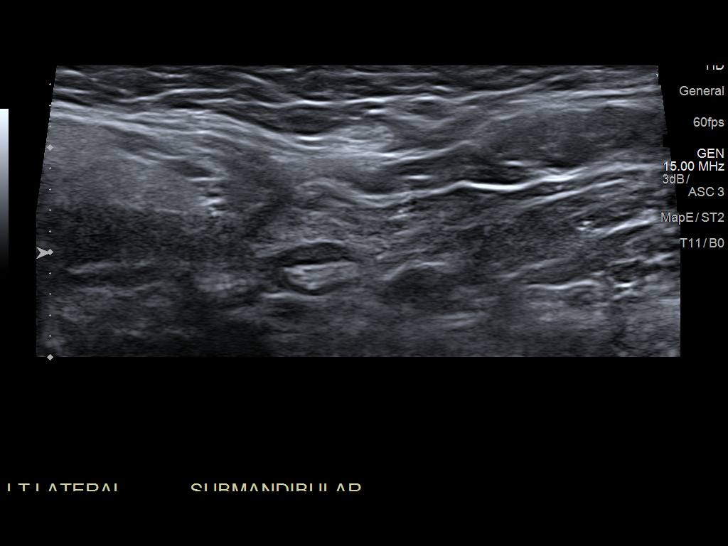
[im 22/24]
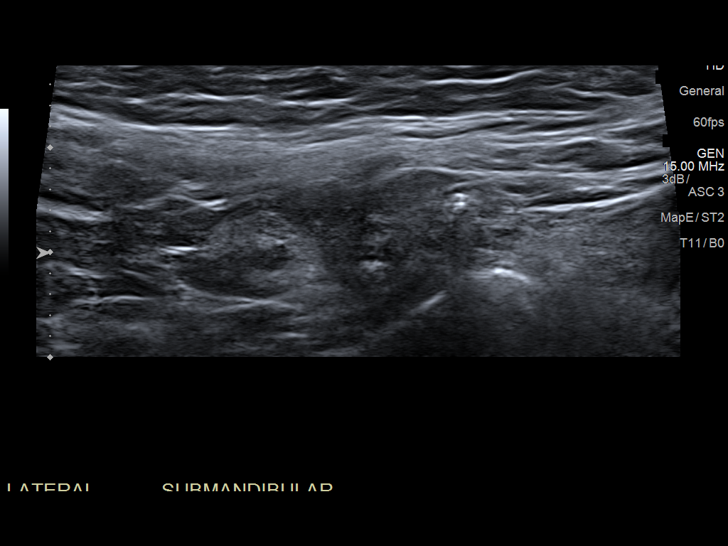
[im 24/24]
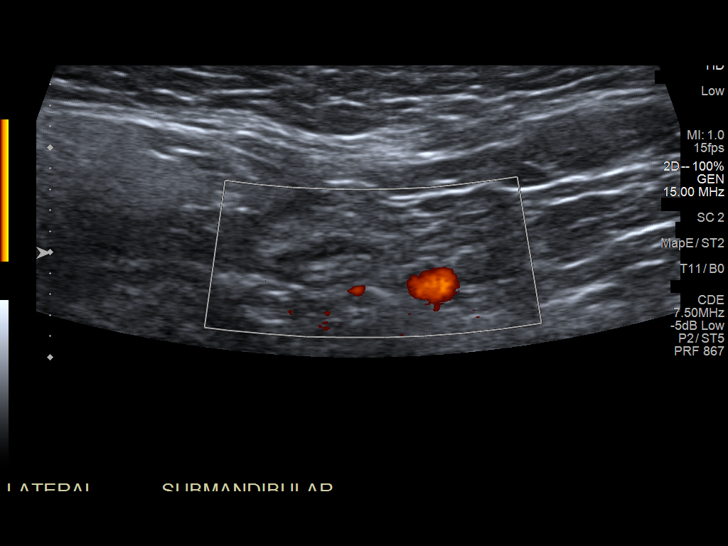

[14 of 24 positions shown; findings below may reference images not displayed]

FINDINGS: Scanning in the right submandibular region. Several lymph nodes in
the area. Largest lymph node measures 2.0 x 0.8 x 1.5 cm with fatty
hilum. This is similar to the prior study. For comparison purposes,
there is a lymph node in a similar location in the left
submandibular region which measures 1.2 x 0.5 cm.

Small additional lymph nodes in the right submandibular region
measuring 4 mm and 5 mm in short axis dimension.
IMPRESSION: Benign-appearing lymph nodes right submandibular region. Similar to
the prior ultrasound. If there is further palpable concern,
cross-sectional imaging with CT neck with contrast recommended.

## 2020-01-15 ENCOUNTER — Other Ambulatory Visit: Payer: Self-pay | Admitting: Family Medicine

## 2020-01-15 DIAGNOSIS — E1121 Type 2 diabetes mellitus with diabetic nephropathy: Secondary | ICD-10-CM

## 2020-01-15 NOTE — Telephone Encounter (Signed)
Requested Prescriptions  Pending Prescriptions Disp Refills  . glipiZIDE (GLUCOTROL) 5 MG tablet [Pharmacy Med Name: GLIPIZIDE 5 MG TABLET] 90 tablet 1    Sig: TAKE 1 TABLET (5 MG TOTAL) BY MOUTH DAILY BEFORE BREAKFAST.     Endocrinology:  Diabetes - Sulfonylureas Passed - 01/15/2020  9:05 AM      Passed - HBA1C is between 0 and 7.9 and within 180 days    Hemoglobin A1C  Date Value Ref Range Status  01/10/2020 7.3 (A) 4.0 - 5.6 % Final   Hgb A1c MFr Bld  Date Value Ref Range Status  07/09/2019 7.2 (H) 4.8 - 5.6 % Final    Comment:             Prediabetes: 5.7 - 6.4          Diabetes: >6.4          Glycemic control for adults with diabetes: <7.0          Passed - Valid encounter within last 6 months    Recent Outpatient Visits          5 days 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[facility])   [facility] Birdie Sons, MD   6 months ago Annual physical exam   [facility] Birdie Sons, MD   1 year ago Hyperkalemia   [facility] Birdie Sons, MD   1 year 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[facility])   [facility] Birdie Sons, MD   1 year ago Annual physical exam   [facility] Birdie Sons, MD      Future Appointments            In 3 weeks Ralene Bathe, MD [place]   In 6 months Fisher, Kirstie Peri, MD [facility], [place]

## 2020-01-16 ENCOUNTER — Other Ambulatory Visit: Payer: Self-pay | Admitting: Family Medicine

## 2020-01-16 DIAGNOSIS — E1121 Type 2 diabetes mellitus with diabetic nephropathy: Secondary | ICD-10-CM

## 2020-01-16 MED ORDER — METFORMIN HCL 850 MG PO TABS: 850.0000 mg | ORAL_TABLET | Freq: Two times a day (BID) | ORAL | 4 refills | Status: DC

## 2020-02-09 ENCOUNTER — Other Ambulatory Visit: Payer: Self-pay | Admitting: Family Medicine

## 2020-02-09 ENCOUNTER — Ambulatory Visit: Payer: Medicare HMO | Admitting: Dermatology

## 2020-02-09 ENCOUNTER — Encounter: Payer: Self-pay | Admitting: Dermatology

## 2020-02-09 ENCOUNTER — Other Ambulatory Visit: Payer: Self-pay

## 2020-02-09 DIAGNOSIS — Z1283 Encounter for screening for malignant neoplasm of skin: Secondary | ICD-10-CM

## 2020-02-09 DIAGNOSIS — D1800 Hemangioma unspecified site: Secondary | ICD-10-CM | POA: Diagnosis not present

## 2020-02-09 DIAGNOSIS — L821 Other seborrheic keratosis: Secondary | ICD-10-CM | POA: Diagnosis not present

## 2020-02-09 DIAGNOSIS — L814 Other melanin hyperpigmentation: Secondary | ICD-10-CM | POA: Diagnosis not present

## 2020-02-09 DIAGNOSIS — L570 Actinic keratosis: Secondary | ICD-10-CM | POA: Diagnosis not present

## 2020-02-09 DIAGNOSIS — L820 Inflamed seborrheic keratosis: Secondary | ICD-10-CM

## 2020-02-09 DIAGNOSIS — L578 Other skin changes due to chronic exposure to nonionizing radiation: Secondary | ICD-10-CM

## 2020-02-09 DIAGNOSIS — D229 Melanocytic nevi, unspecified: Secondary | ICD-10-CM | POA: Diagnosis not present

## 2020-02-09 DIAGNOSIS — Z86007 Personal history of in-situ neoplasm of skin: Secondary | ICD-10-CM | POA: Diagnosis not present

## 2020-02-09 NOTE — Progress Notes (Signed)
Follow-Up Visit   Subjective  Bobby Thomas is a 80 y.o. 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 The patient presents for Upper Body Skin Exam (UBSE) for skin cancer screening and mole check. Check Irritated itchy moles on the back, L thigh and shoulder pt would like to have removed today  The following portions of the chart were reviewed this encounter and updated as appropriate:   Tobacco  Allergies  Meds  Problems  Med Hx  Surg Hx  Fam Hx     Review of Systems:  No other skin or systemic complaints except as noted in HPI or Assessment and Plan.  Objective  Well appearing patient in no apparent distress; mood and affect are within normal limits.  All skin waist up examined.  Objective  R post auricular area: Well healed scar with no evidence of recurrence.   Objective  Scalp, forehead x 20 (20): Erythematous thin papules/macules with gritty scale.   Objective  L post shoulder x 2, back x 2, L thigh x 1 (5): Erythematous keratotic or waxy stuck-on papule or plaque.    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 Observe for recurrence. Call clinic for new or changing lesions.  Recommend regular skin exams, daily broad-spectrum spf 30+ sunscreen use, and photoprotection.     AK (actinic keratosis) (20) Scalp, forehead x 20  Destruction of lesion - Scalp, forehead x 20 Complexity: simple   Destruction method: cryotherapy   Informed consent: discussed and consent obtained   Timeout:  patient name, date of birth, surgical site, and procedure verified Lesion destroyed using liquid nitrogen: Yes   Region frozen until ice ball extended beyond lesion: Yes   Outcome: patient tolerated procedure well with no complications   Post-procedure details: wound care instructions given     Inflamed seborrheic keratosis (5) L post shoulder x 2, back x 2, L thigh x 1  Destruction of lesion - L post shoulder x 2, back x 2, L thigh x 1 Complexity: simple   Destruction method: cryotherapy   Informed consent: discussed and consent obtained   Timeout:  patient name, date of birth, surgical site, and procedure verified Lesion destroyed using liquid nitrogen: Yes   Region frozen until ice ball extended beyond lesion: Yes   Outcome: patient tolerated procedure well with no complications   Post-procedure details: wound care instructions given    Skin cancer screening   Lentigines - Scattered tan macules - Discussed due to sun exposure - Benign, observe - Call for any changes  Seborrheic Keratoses - Stuck-on, waxy, tan-brown papules and plaques  - Discussed benign etiology and prognosis. - Observe - Call for any changes  Melanocytic Nevi - Tan-brown and/or pink-flesh-colored symmetric macules and papules - Benign appearing on exam today - Observation - Call clinic for new or changing moles - Recommend daily use of broad spectrum spf 30+ sunscreen to sun-exposed areas.   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  - Call  for new or changing lesions.  Skin cancer screening performed today.  Return in about 6 months (around 08/08/2020) for TBSE .  IMarye Round, CMA, am acting as scribe for Sarina Ser, MD .  Documentation: I have reviewed the above documentation for accuracy and completeness, and I agree with the above.  Sarina Ser, MD

## 2020-02-09 NOTE — Patient Instructions (Signed)
Cryotherapy Aftercare  . Wash gently with soap and water everyday.   . Apply Vaseline and Band-Aid daily until healed.  

## 2020-02-18 ENCOUNTER — Other Ambulatory Visit: Payer: Self-pay | Admitting: Family Medicine

## 2020-02-26 ENCOUNTER — Other Ambulatory Visit: Payer: Self-pay | Admitting: Family Medicine

## 2020-02-26 DIAGNOSIS — I779 Disorder of arteries and arterioles, unspecified: Secondary | ICD-10-CM

## 2020-03-03 ENCOUNTER — Telehealth: Payer: Self-pay | Admitting: Family Medicine

## 2020-03-03 DIAGNOSIS — I779 Disorder of arteries and arterioles, unspecified: Secondary | ICD-10-CM

## 2020-03-03 MED ORDER — SIMVASTATIN 20 MG PO TABS: 20.0000 mg | ORAL_TABLET | Freq: Every day | ORAL | 1 refills | Status: DC

## 2020-03-03 NOTE — Telephone Encounter (Signed)
Prescription sent to pharmacy on 02/26/20 #90 with 1 refill.Called [facility] and spoke to [place]. Thurmond Butts states that they did not receive the refill previously. Refill resent to pharmacy.

## 2020-03-03 NOTE — Telephone Encounter (Signed)
Copied from [place] [id number]. Topic: Quick Communication - Rx Refill/Question >> Mar 03, 2020  4:44 PM Tessa Lerner A wrote: Medication: simvastatin (ZOCOR) 20 MG tablet - patient has 1(one) pill left   Has the patient contacted their pharmacy? Patient was directed, by pharmacy, to contact PCP  Preferred Pharmacy (with phone number or street name): [id number] [place], [place] - [address] Phone: [phone]  Agent: Please be advised that RX refills may take up to 3 business days. We ask that you follow-up with your pharmacy.

## 2020-04-12 DIAGNOSIS — I129 Hypertensive chronic kidney disease with stage 1 through stage 4 chronic kidney disease, or unspecified chronic kidney disease: Secondary | ICD-10-CM | POA: Diagnosis not present

## 2020-04-12 DIAGNOSIS — R809 Proteinuria, unspecified: Secondary | ICD-10-CM | POA: Diagnosis not present

## 2020-04-12 DIAGNOSIS — N1832 Chronic kidney disease, stage 3b: Secondary | ICD-10-CM | POA: Diagnosis not present

## 2020-04-12 DIAGNOSIS — E875 Hyperkalemia: Secondary | ICD-10-CM | POA: Diagnosis not present

## 2020-04-12 DIAGNOSIS — E1122 Type 2 diabetes mellitus with diabetic chronic kidney disease: Secondary | ICD-10-CM | POA: Diagnosis not present

## 2020-04-16 DIAGNOSIS — E1165 Type 2 diabetes mellitus with hyperglycemia: Secondary | ICD-10-CM | POA: Diagnosis not present

## 2020-04-16 DIAGNOSIS — R69 Illness, unspecified: Secondary | ICD-10-CM | POA: Diagnosis not present

## 2020-04-16 DIAGNOSIS — D6869 Other thrombophilia: Secondary | ICD-10-CM | POA: Diagnosis not present

## 2020-04-16 DIAGNOSIS — I10 Essential (primary) hypertension: Secondary | ICD-10-CM | POA: Diagnosis not present

## 2020-04-16 DIAGNOSIS — I951 Orthostatic hypotension: Secondary | ICD-10-CM | POA: Diagnosis not present

## 2020-04-16 DIAGNOSIS — I4891 Unspecified atrial fibrillation: Secondary | ICD-10-CM | POA: Diagnosis not present

## 2020-04-16 DIAGNOSIS — E1151 Type 2 diabetes mellitus with diabetic peripheral angiopathy without gangrene: Secondary | ICD-10-CM | POA: Diagnosis not present

## 2020-04-16 DIAGNOSIS — I2510 Atherosclerotic heart disease of native coronary artery without angina pectoris: Secondary | ICD-10-CM | POA: Diagnosis not present

## 2020-04-16 DIAGNOSIS — E785 Hyperlipidemia, unspecified: Secondary | ICD-10-CM | POA: Diagnosis not present

## 2020-04-16 DIAGNOSIS — Z008 Encounter for other general examination: Secondary | ICD-10-CM | POA: Diagnosis not present

## 2020-04-16 DIAGNOSIS — E663 Overweight: Secondary | ICD-10-CM | POA: Diagnosis not present

## 2020-04-18 ENCOUNTER — Other Ambulatory Visit: Payer: Self-pay | Admitting: Family Medicine

## 2020-04-18 DIAGNOSIS — I129 Hypertensive chronic kidney disease with stage 1 through stage 4 chronic kidney disease, or unspecified chronic kidney disease: Secondary | ICD-10-CM | POA: Diagnosis not present

## 2020-04-18 DIAGNOSIS — R809 Proteinuria, unspecified: Secondary | ICD-10-CM | POA: Diagnosis not present

## 2020-04-18 DIAGNOSIS — E1121 Type 2 diabetes mellitus with diabetic nephropathy: Secondary | ICD-10-CM

## 2020-04-18 DIAGNOSIS — E875 Hyperkalemia: Secondary | ICD-10-CM | POA: Diagnosis not present

## 2020-04-18 DIAGNOSIS — E1122 Type 2 diabetes mellitus with diabetic chronic kidney disease: Secondary | ICD-10-CM | POA: Diagnosis not present

## 2020-04-18 DIAGNOSIS — N1832 Chronic kidney disease, stage 3b: Secondary | ICD-10-CM | POA: Diagnosis not present

## 2020-05-01 ENCOUNTER — Other Ambulatory Visit: Payer: Self-pay

## 2020-05-01 ENCOUNTER — Encounter: Payer: Self-pay | Admitting: Dermatology

## 2020-05-01 ENCOUNTER — Ambulatory Visit: Payer: Medicare HMO | Admitting: Dermatology

## 2020-05-01 DIAGNOSIS — L820 Inflamed seborrheic keratosis: Secondary | ICD-10-CM | POA: Diagnosis not present

## 2020-05-01 DIAGNOSIS — L570 Actinic keratosis: Secondary | ICD-10-CM | POA: Diagnosis not present

## 2020-05-01 DIAGNOSIS — L578 Other skin changes due to chronic exposure to nonionizing radiation: Secondary | ICD-10-CM | POA: Diagnosis not present

## 2020-05-01 NOTE — Progress Notes (Unsigned)
   Follow-Up Visit   Subjective  Bobby Thomas is a 80 y.o. male who presents for the following: Spot Check (Pt c/o of new spots on back, chest, and scalp that have been itching that he would like checked today. ).  The following portions of the chart were reviewed this encounter and updated as appropriate:  Tobacco  Allergies  Meds  Problems  Med Hx  Surg Hx  Fam Hx     Review of Systems: No other skin or systemic complaints except as noted in HPI or Assessment and Plan.  Objective  Well appearing patient in no apparent distress; mood and affect are within normal limits.  A focused examination was performed including back, chest, scalp. Relevant physical exam findings are noted in the Assessment and Plan.  Objective  Chest x 8, back and shoulders x 9 (17): Erythematous keratotic or waxy stuck-on papule or plaque.   Objective  Scalp and ears x 8 (8): Erythematous thin papules/macules with gritty scale.   Assessment & Plan  Inflamed seborrheic keratosis (17) Chest x 8, back and shoulders x 9 Prior to procedure, discussed risks of blister formation, small wound, skin dyspigmentation, or rare scar following cryotherapy.   Destruction of lesion - Chest x 8, back and shoulders x 9 Complexity: simple   Destruction method: cryotherapy   Informed consent: discussed and consent obtained   Timeout:  patient name, date of birth, surgical site, and procedure verified Lesion destroyed using liquid nitrogen: Yes   Region frozen until ice ball extended beyond lesion: Yes   Outcome: patient tolerated procedure well with no complications   Post-procedure details: wound care instructions given    AK (actinic keratosis) (8) Scalp and ears x 8 Prior to procedure, discussed risks of blister formation, small wound, skin dyspigmentation, or rare scar following cryotherapy.   Destruction of lesion - Scalp and ears x 8 Complexity: simple   Destruction method: cryotherapy   Informed  consent: discussed and consent obtained   Timeout:  patient name, date of birth, surgical site, and procedure verified Lesion destroyed using liquid nitrogen: Yes   Region frozen until ice ball extended beyond lesion: Yes   Outcome: patient tolerated procedure well with no complications   Post-procedure details: wound care instructions given    Actinic Damage - chronic, secondary to cumulative UV radiation exposure/sun exposure over time - diffuse scaly erythematous macules with underlying dyspigmentation - Recommend daily broad spectrum sunscreen SPF 30+ to sun-exposed areas, reapply every 2 hours as needed.  - Recommend staying in the shade or wearing long sleeves, sun glasses (UVA+UVB protection) and wide brim hats (4-inch brim around the entire circumference of the hat). - Call for new or changing lesions.  Return in 4 months (on 08/16/2020) for as scheduled.   IHarriett Sine, CMA, am acting as scribe for Sarina Ser, MD.  Documentation: I have reviewed the above documentation for accuracy and completeness, and I agree with the above.  Sarina Ser, MD

## 2020-05-03 ENCOUNTER — Encounter: Payer: Self-pay | Admitting: Dermatology

## 2020-05-09 ENCOUNTER — Other Ambulatory Visit: Payer: Self-pay | Admitting: Family Medicine

## 2020-05-11 ENCOUNTER — Other Ambulatory Visit: Payer: Self-pay | Admitting: Family Medicine

## 2020-05-29 ENCOUNTER — Other Ambulatory Visit: Payer: Self-pay | Admitting: Family Medicine

## 2020-05-29 NOTE — Telephone Encounter (Signed)
Requested medication (s) are due for refill today:   Yes  Requested medication (s) are on the active medication list:   Yes  Future visit scheduled:   Yes   Last ordered: 02/10/2020 #90, 1 refill  Returned because protocol failed with lab work being due.   Requested Prescriptions  Pending Prescriptions Disp Refills   clopidogrel (PLAVIX) 75 MG tablet [Pharmacy Med Name: CLOPIDOGREL 75 MG TABLET] 90 tablet 1    Sig: TAKE 1 TABLET BY MOUTH EVERY DAY      Hematology: Antiplatelets - clopidogrel Failed - 05/29/2020 11:25 AM      Failed - Evaluate AST, ALT within 2 months of therapy initiation.      Failed - ALT in normal range and within 360 days    ALT  Date Value Ref Range Status  12/29/2018 15 0 - 44 U/L Final   SGPT (ALT)  Date Value Ref Range Status  05/20/2012 20 12 - 78 U/L Final          Failed - AST in normal range and within 360 days    AST  Date Value Ref Range Status  12/29/2018 28 15 - 41 U/L Final   SGOT(AST)  Date Value Ref Range Status  03/04/2011 15 15 - 37 Unit/L Final          Failed - HCT in normal range and within 180 days    Hematocrit  Date Value Ref Range Status  07/09/2019 42.9 37.5 - 51.0 % Final          Failed - HGB in normal range and within 180 days    Hemoglobin  Date Value Ref Range Status  07/09/2019 14.7 13.0 - 17.7 g/dL Final          Failed - PLT in normal range and within 180 days    Platelets  Date Value Ref Range Status  07/09/2019 308 150 - 450 x10E3/uL Final          Passed - Valid encounter within last 6 months    Recent Outpatient Visits           4 months 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[facility])   [facility] Birdie Sons, MD   10 months ago Annual physical exam   [facility] Birdie Sons, MD   1 year ago Hyperkalemia   [facility] Birdie Sons, MD   1 year 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[facility])   [facility] Birdie Sons,  MD   1 year ago Annual physical exam   [facility] Birdie Sons, MD       Future Appointments             In 1 month Fisher, Kirstie Peri, MD [facility], [place]   In 2 months Ralene Bathe, MD [place]

## 2020-06-15 ENCOUNTER — Other Ambulatory Visit: Payer: Self-pay | Admitting: Family Medicine

## 2020-06-15 DIAGNOSIS — I779 Disorder of arteries and arterioles, unspecified: Secondary | ICD-10-CM

## 2020-06-15 NOTE — Telephone Encounter (Signed)
Call to pharmacy- Rx was filled and picked up 05/29/20- too soon.

## 2020-07-14 ENCOUNTER — Encounter: Payer: Self-pay | Admitting: Family Medicine

## 2020-07-14 ENCOUNTER — Other Ambulatory Visit: Payer: Self-pay

## 2020-07-14 ENCOUNTER — Ambulatory Visit (INDEPENDENT_AMBULATORY_CARE_PROVIDER_SITE_OTHER): Payer: Medicare HMO | Admitting: Family Medicine

## 2020-07-14 VITALS — BP 166/83 | HR 62 | Temp 97.6°F | Resp 16 | Wt 171.6 lb

## 2020-07-14 DIAGNOSIS — I779 Disorder of arteries and arterioles, unspecified: Secondary | ICD-10-CM | POA: Diagnosis not present

## 2020-07-14 DIAGNOSIS — N1832 Chronic kidney disease, stage 3b: Secondary | ICD-10-CM | POA: Diagnosis not present

## 2020-07-14 DIAGNOSIS — Z125 Encounter for screening for malignant neoplasm of prostate: Secondary | ICD-10-CM | POA: Diagnosis not present

## 2020-07-14 DIAGNOSIS — I10 Essential (primary) hypertension: Secondary | ICD-10-CM | POA: Diagnosis not present

## 2020-07-14 DIAGNOSIS — E785 Hyperlipidemia, unspecified: Secondary | ICD-10-CM | POA: Diagnosis not present

## 2020-07-14 DIAGNOSIS — E1121 Type 2 diabetes mellitus with diabetic nephropathy: Secondary | ICD-10-CM | POA: Diagnosis not present

## 2020-07-14 DIAGNOSIS — Z23 Encounter for immunization: Secondary | ICD-10-CM | POA: Diagnosis not present

## 2020-07-14 DIAGNOSIS — I480 Paroxysmal atrial fibrillation: Secondary | ICD-10-CM

## 2020-07-14 DIAGNOSIS — Z0000 Encounter for general adult medical examination without abnormal findings: Secondary | ICD-10-CM | POA: Diagnosis not present

## 2020-07-14 NOTE — Progress Notes (Signed)
Complete Physica Exam     [name], Male    DOB: 07/06/1940, 80 y.o.   MRN: [id number] Visit Date: 07/14/2020  Today's Provider: Lelon Huh, MD     Subjective    Bobby Thomas is a 80 y.o. male who presents today for his complete physical exam. He reports consuming a general diet. Home exercise routine includes walking and yard work. He generally feels well. He reports sleeping fairly well. He does not have additional problems to discuss today.   HPI Diabetes Mellitus Type II, Follow-up  Lab Results  Component Value Date   HGBA1C 7.3 (A) 01/10/2020   HGBA1C 7.2 (H) 07/09/2019   HGBA1C 7.4 (H) 12/30/2018   Wt Readings from Last 3 Encounters:  07/14/20 171 lb 9.6 oz (77.8 kg)  01/10/20 180 lb (81.6 kg)  08/30/19 179 lb (81.2 kg)   Last seen for diabetes 6 months ago.  Management since then includes continue same medications. He reports good compliance with treatment. He is not having side effects.  Symptoms: No fatigue No foot ulcerations  No appetite changes No nausea  No paresthesia of the feet  No polydipsia  No polyuria No visual disturbances   No vomiting     Home blood sugar records: fasting range: 122-149  Episodes of hypoglycemia? No    Current insulin regiment: none Most Recent Eye Exam: 09/30/2019 Current exercise: walking and yard work Current diet habits: well balanced  Pertinent Labs: Lab Results  Component Value Date   CHOL 148 07/09/2019   HDL 48 07/09/2019   LDLCALC 68 07/09/2019   TRIG 192 (H) 07/09/2019   CHOLHDL 3.1 07/09/2019   Lab Results  Component Value Date   NA 138 07/09/2019   K 5.9 (H) 07/09/2019   CREATININE 2.00 (H) 07/09/2019   GFRNONAA 31 (L) 07/09/2019   GFRAA 36 (L) 07/09/2019   GLUCOSE 163 (H) 07/09/2019     ---------------------------------------------------------------------------------------------------   Follow up for Intermittent atrial fibrillation:  The patient was last seen for  this 6 months ago. Changes made at last visit include none.  He reports good compliance with treatment. He feels that condition is Unchanged. He is not having side effects.   -----------------------------------------------------------------------------------------   Hypertension, follow-up  BP Readings from Last 3 Encounters:  07/14/20 (!) 166/83  01/10/20 129/74  08/30/19 (!) 132/72   Wt Readings from Last 3 Encounters:  07/14/20 171 lb 9.6 oz (77.8 kg)  01/10/20 180 lb (81.6 kg)  08/30/19 179 lb (81.2 kg)     He was last seen for hypertension 1  year  ago.  BP at that visit was 112/62. Management since that visit includes advising patient to drink more water to help improve kidney functions.  He reports good compliance with treatment. He is not having side effects.  He is following a Regular diet. He is exercising. He does not smoke.  Use of agents associated with hypertension: NSAIDS.   Outside blood pressures are occasionally checkee. Symptoms: No chest pain No chest pressure  No palpitations No syncope  No dyspnea No orthopnea  No paroxysmal nocturnal dyspnea No lower extremity edema   Pertinent labs: Lab Results  Component Value Date   CHOL 148 07/09/2019   HDL 48 07/09/2019   LDLCALC 68 07/09/2019   TRIG 192 (H) 07/09/2019   CHOLHDL 3.1 07/09/2019   Lab Results  Component Value Date   NA 138 07/09/2019   K 5.9 (H) 07/09/2019   CREATININE 2.00 (  H) 07/09/2019   GFRNONAA 31 (L) 07/09/2019   GFRAA 36 (L) 07/09/2019   GLUCOSE 163 (H) 07/09/2019     The 10-year ASCVD risk score Mikey Bussing DC Jr., et al., 2013) is: 66.6%   ---------------------------------------------------------------------------------------------------      Medications: Outpatient Medications Prior to Visit  Medication Sig   aspirin EC 81 MG tablet Take 81 mg by mouth daily.   clopidogrel (PLAVIX) 75 MG tablet Take 1 tablet (75 mg total) by mouth daily.   EPINEPHrine 0.3 mg/0.3 mL IJ  SOAJ injection as needed.    FARXIGA 5 MG TABS tablet Take 5 mg by mouth daily.   furosemide (LASIX) 40 MG tablet Take by mouth.   glipiZIDE (GLUCOTROL) 5 MG tablet TAKE 1 TABLET BY MOUTH DAILY BEFORE BREAKFAST.   LORazepam (ATIVAN) 1 MG tablet TAKE 1/2 TO 1 TABLET BY MOUTH NIGHTLY AT BEDTIME   metFORMIN (GLUCOPHAGE) 850 MG tablet Take 1 tablet (850 mg total) by mouth 2 (two) times daily with a meal.   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   No facility-administered medications prior to visit.    Allergies  Allergen Reactions   Hydrocodone-Guaifenesin Shortness Of Breath and Other (See Comments)    Not able to sit still when using Codiclear DH   Bee Venom Other (See Comments)   Codeine     Other reaction(s): Pruritus   Morphine Sulfate     Other reaction(s): Pruritus   Warfarin Sodium Diarrhea    Patient Care Team: Birdie Sons, MD as PCP - General (Family Medicine) Lucky Cowboy, Erskine Squibb, MD as Referring Physician (Vascular Surgery) Anell Barr, [place] (Optometry) Cammy Copa, [facility] as [facility] (Pharmacist) Ralene Bathe, MD as Consulting Physician (Dermatology) Manya Silvas, MD (Inactive) as Consulting Physician (Gastroenterology) Concepcion Elk., MD (Dentistry) Lavonia Dana, MD as Consulting Physician (Nephrology)  Review of Systems  Constitutional:  Negative for appetite change, chills, fatigue and fever.  HENT:  Positive for congestion, ear pain, postnasal drip and rhinorrhea. Negative for hearing loss, nosebleeds and trouble swallowing.   Eyes:  Negative for pain and visual disturbance.  Respiratory:  Negative for cough, chest tightness and shortness of breath.   Cardiovascular:  Negative for chest pain, palpitations and  leg swelling.  Gastrointestinal:  Negative for abdominal pain, blood in stool, constipation, diarrhea, nausea and vomiting.  Endocrine: Negative for polydipsia, polyphagia and polyuria.  Genitourinary:  Negative for dysuria and flank pain.  Musculoskeletal:  Negative for arthralgias, back pain, joint swelling, myalgias and neck stiffness.  Skin:  Negative for color change, rash and wound.  Neurological:  Negative for dizziness, tremors, seizures, speech difficulty, weakness, light-headedness and headaches.  Psychiatric/Behavioral:  Negative for behavioral problems, confusion, decreased concentration, dysphoric mood and sleep disturbance. The patient is not nervous/anxious.   All other systems reviewed and are negative.      Objective    Vitals: BP (!) 166/83 (BP Location: Right Arm, Patient Position: Sitting, Cuff Size: Normal)   Pulse 62   Temp 97.6 F (36.4 C) (Temporal)   Resp 16   Wt 171 lb 9.6 oz (77.8 kg)   BMI 27.70 kg/m    Physical Exam  General Appearance:     Well developed, well  nourished male. Alert, cooperative, in no acute distress, appears stated age  Head:    Normocephalic, without obvious abnormality, atraumatic  Eyes:    PERRL, conjunctiva/corneas clear, EOM's intact, fundi    benign, both eyes       Ears:    Normal TM's and external ear canals, both ears  Nose:   Nares normal, septum midline, mucosa normal, no drainage   or sinus tenderness  Back:     Symmetric, no curvature, ROM normal, no CVA tenderness  Lungs:     Clear to auscultation bilaterally, respirations unlabored  Chest wall:    No tenderness or deformity  Heart:    Normal heart rate. Irregularly irregular rhythm. No murmurs, rubs, or gallops.  S1 and S2 normal  Abdomen:     Soft, non-tender, bowel sounds active all four quadrants,    no masses, no organomegaly  Genitalia:    deferred  Rectal:    deferred  Extremities:   All extremities are intact. No cyanosis or edema  Pulses:   2+ and  symmetric all extremities  Skin:   Skin color, texture, turgor normal, no rashes or lesions  Lymph nodes:   Cervical, supraclavicular, and axillary nodes normal  Neurologic:   CNII-XII intact. Normal strength, sensation and reflexes      throughout     Assessment & Plan     1. Annual physical exam   2. 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(HCC)  - Hemoglobin A1c  3. Carotid artery disease, unspecified laterality, unspecified type ([place]) Asymptomatic. Compliant with medication.  Continue aggressive risk factor modification.    4. Intermittent atrial fibrillation (HCC) Intolerant to warfarin and NOACs. Pt is tolerating ECASA and clopidogrel and understands that evidence for CVA risk reduction with dual antiplatelets is not comparable to warfarin and NOACs.   5. Prostate cancer screening  - PSA Total (Reflex To Free) ([facility] only)  6. Essential hypertension Well controlled.  Continue current medications.   - EKG 12-Lead  7. Stage 3b chronic kidney disease ([place])   8. Hyperlipidemia, unspecified hyperlipidemia type He is tolerating simvastatin well with no adverse effects.   - CBC - Comprehensive metabolic panel - Lipid panel  9. Encounter for immunization  - PFIZER Comirnaty(GRAY TOP)COVID-19 Vaccine     The entirety of the information documented in the History of Present Illness, Review of Systems and Physical Exam were personally obtained by me. Portions of this information were initially documented by the CMA and reviewed by me for thoroughness and accuracy.     Lelon Huh, MD  [facility] [phone] (phone) [phone] (fax)  [place]

## 2020-07-14 NOTE — Progress Notes (Signed)
Annual Wellness Visit     [name], Male    DOB: 04-08-1940, 80 y.o.   MRN: [id number] Visit Date: 07/14/2020  Today's Provider: Lelon Huh, MD    Subjective    Bobby Thomas is a 80 y.o. male who presents today for his Annual Wellness Visit.     Medications: Outpatient Medications Prior to Visit  Medication Sig   aspirin EC 81 MG tablet Take 81 mg by mouth daily.   clopidogrel (PLAVIX) 75 MG tablet Take 1 tablet (75 mg total) by mouth daily.   EPINEPHrine 0.3 mg/0.3 mL IJ SOAJ injection as needed.    FARXIGA 5 MG TABS tablet Take 5 mg by mouth daily.   furosemide (LASIX) 40 MG tablet Take by mouth.   glipiZIDE (GLUCOTROL) 5 MG tablet TAKE 1 TABLET BY MOUTH DAILY BEFORE BREAKFAST.   LORazepam (ATIVAN) 1 MG tablet TAKE 1/2 TO 1 TABLET BY MOUTH NIGHTLY AT BEDTIME   metFORMIN (GLUCOPHAGE) 850 MG tablet Take 1 tablet (850 mg total) by mouth 2 (two) times daily with a meal.   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   No facility-administered medications prior to visit.    Allergies  Allergen Reactions   Hydrocodone-Guaifenesin Shortness Of Breath and Other (See Comments)    Not able to sit still when using Codiclear DH   Bee Venom Other (See Comments)   Codeine     Other reaction(s): Pruritus   Morphine Sulfate     Other reaction(s): Pruritus   Warfarin Sodium Diarrhea    Patient Care Team: Birdie Sons, MD as PCP - General (Family Medicine) Lucky Cowboy, Erskine Squibb, MD as Referring Physician (Vascular Surgery) Anell Barr, [place] (Optometry) Cammy Copa, [facility] as [place] Management (Pharmacist) Ralene Bathe, MD as Consulting Physician (Dermatology) Manya Silvas, MD (Inactive) as Consulting Physician  (Gastroenterology) Concepcion Elk., MD (Dentistry) Lavonia Dana, MD as Consulting Physician (Nephrology)        Objective     Most recent functional status assessment: In your present state of health, do you have any difficulty performing the following activities: 07/14/2020  Hearing? Y  Vision? N  Difficulty concentrating or making decisions? Y  Walking or climbing stairs? N  Dressing or bathing? N  Doing errands, shopping? N  Some recent data might be hidden   Most recent fall risk assessment: Fall Risk  07/14/2020  Falls in the past year? 0  Number falls in past yr: 0  Injury with Fall? 0  Comment -  Follow up Falls evaluation completed    Most recent depression screenings: PHQ 2/9 Scores 07/14/2020 07/14/2020  PHQ - 2 Score 0 0  PHQ- 9 Score 0 0   Most recent cognitive screening: 6CIT Screen 07/09/2019  What Year? 0 points  What month? 0 points  What time? 0 points  Count back from 20 4 points  Months in reverse 4 points  Repeat phrase 10 points  Total Score 18   Most recent Audit-C alcohol use screening Alcohol Use Disorder Test (AUDIT) 07/14/2020  1. How often do you have a drink containing alcohol? 0  2. How many drinks containing alcohol do you have on a typical day when  you are drinking? 0  3. How often do you have six or more drinks on one occasion? 0  AUDIT-C Score 0   A score of 3 or more in women, and 4 or more in men indicates increased risk for alcohol abuse, EXCEPT if all of the points are from question 1   No results found for any visits on 07/14/20.  Assessment & Plan     Annual wellness visit done today including the all of the following: Reviewed patient's Family Medical History Reviewed and updated list of patient's medical providers Assessment of cognitive impairment was done Assessed patient's functional ability Established a written schedule for health screening [address] Completed and Reviewed  Exercise  Activities and Dietary recommendations  Goals      DIET - INCREASE WATER INTAKE     Recommend increasing water intake to 4-6 glasses a day.           Immunization History  Administered Date(s) Administered   Fluad Quad(high Dose 65+) 11/02/2018   Influenza Split 11/09/2010, 11/06/2011   Influenza, High Dose Seasonal PF 11/09/2013, 11/24/2014, 10/28/2015, 11/09/2016, 10/18/2017, 11/07/2019   Influenza,inj,Quad PF,6+ Mos 12/02/2012   PFIZER Comirnaty(Gray Top)Covid-19 Tri-Sucrose Vaccine 07/14/2020   PFIZER(Purple Top)SARS-COV-2 Vaccination 03/12/2019, 04/02/2019, 11/07/2019   Pneumococcal Conjugate-13 11/09/2013   Pneumococcal Polysaccharide-23 11/03/2007   Tdap 05/13/2011   Zoster Recombinat (Shingrix) 10/23/2017, 01/07/2018   Zoster, Live 05/13/2011    Health Maintenance  Topic Date Due   FOOT EXAM  Never done   Hepatitis C Screening  Never done   COLONOSCOPY (Pts 45-80yrs Insurance coverage will need to be confirmed)  10/03/2019   URINE MICROALBUMIN  11/02/2019   HEMOGLOBIN A1C  07/10/2020   INFLUENZA VACCINE  09/04/2020   OPHTHALMOLOGY EXAM  09/29/2020   COVID-19 Vaccine (5 - Booster for Pfizer series) 11/13/2020   TETANUS/TDAP  05/12/2021   PNA vac Low Risk Adult  Completed   Zoster Vaccines- Shingrix  Completed   HPV VACCINES  Aged Out     Discussed health benefits of physical activity, and encouraged him to engage in regular exercise appropriate for his age and condition.        The entirety of the information documented in the History of Present Illness, Review of Systems and Physical Exam were personally obtained by me. Portions of this information were initially documented by the CMA and reviewed by me for thoroughness and accuracy.     Lelon Huh, MD  [facility] [phone] (phone) [phone] (fax)  [place]

## 2020-07-15 LAB — CBC
Hematocrit: 45.2 % (ref 37.5–51.0)
Hemoglobin: 15.6 g/dL (ref 13.0–17.7)
MCH: 31.1 pg (ref 26.6–33.0)
MCHC: 34.5 g/dL (ref 31.5–35.7)
MCV: 90 fL (ref 79–97)
NRBC: 1 % — ABNORMAL HIGH (ref 0–0)
Platelets: 373 10*3/uL (ref 150–450)
RBC: 5.01 x10E6/uL (ref 4.14–5.80)
RDW: 13.3 % (ref 11.6–15.4)
WBC: 10.8 10*3/uL (ref 3.4–10.8)

## 2020-07-15 LAB — COMPREHENSIVE METABOLIC PANEL
ALT: 15 IU/L (ref 0–44)
AST: 18 IU/L (ref 0–40)
Albumin/Globulin Ratio: 1.5 (ref 1.2–2.2)
Albumin: 4.7 g/dL (ref 3.7–4.7)
Alkaline Phosphatase: 73 IU/L (ref 44–121)
BUN/Creatinine Ratio: 21 (ref 10–24)
BUN: 44 mg/dL — ABNORMAL HIGH (ref 8–27)
Bilirubin Total: 0.6 mg/dL (ref 0.0–1.2)
CO2: 22 mmol/L (ref 20–29)
Calcium: 10.7 mg/dL — ABNORMAL HIGH (ref 8.6–10.2)
Chloride: 99 mmol/L (ref 96–106)
Creatinine, Ser: 2.06 mg/dL — ABNORMAL HIGH (ref 0.76–1.27)
Globulin, Total: 3.2 g/dL (ref 1.5–4.5)
Glucose: 155 mg/dL — ABNORMAL HIGH (ref 65–99)
Potassium: 5.8 mmol/L — ABNORMAL HIGH (ref 3.5–5.2)
Sodium: 139 mmol/L (ref 134–144)
Total Protein: 7.9 g/dL (ref 6.0–8.5)
eGFR: 33 mL/min/{1.73_m2} — ABNORMAL LOW (ref >59)

## 2020-07-15 LAB — LIPID PANEL
Chol/HDL Ratio: 3.3 ratio (ref 0.0–5.0)
Cholesterol, Total: 153 mg/dL (ref 100–199)
HDL: 47 mg/dL (ref >39)
LDL Chol Calc (NIH): 75 mg/dL (ref 0–99)
Triglycerides: 183 mg/dL — ABNORMAL HIGH (ref 0–149)
VLDL Cholesterol Cal: 31 mg/dL (ref 5–40)

## 2020-07-15 LAB — PSA TOTAL (REFLEX TO FREE): Prostate Specific Ag, Serum: 0.3 ng/mL (ref 0.0–4.0)

## 2020-07-15 LAB — HEMOGLOBIN A1C
Est. average glucose Bld gHb Est-mCnc: 171 mg/dL
Hgb A1c MFr Bld: 7.6 % — ABNORMAL HIGH (ref 4.8–5.6)

## 2020-07-25 DIAGNOSIS — E1122 Type 2 diabetes mellitus with diabetic chronic kidney disease: Secondary | ICD-10-CM | POA: Diagnosis not present

## 2020-07-25 DIAGNOSIS — E875 Hyperkalemia: Secondary | ICD-10-CM | POA: Diagnosis not present

## 2020-07-25 DIAGNOSIS — N1832 Chronic kidney disease, stage 3b: Secondary | ICD-10-CM | POA: Diagnosis not present

## 2020-07-25 DIAGNOSIS — R809 Proteinuria, unspecified: Secondary | ICD-10-CM | POA: Diagnosis not present

## 2020-07-25 DIAGNOSIS — I129 Hypertensive chronic kidney disease with stage 1 through stage 4 chronic kidney disease, or unspecified chronic kidney disease: Secondary | ICD-10-CM | POA: Diagnosis not present

## 2020-07-31 DIAGNOSIS — N2581 Secondary hyperparathyroidism of renal origin: Secondary | ICD-10-CM | POA: Insufficient documentation

## 2020-07-31 DIAGNOSIS — R809 Proteinuria, unspecified: Secondary | ICD-10-CM | POA: Diagnosis not present

## 2020-07-31 DIAGNOSIS — I129 Hypertensive chronic kidney disease with stage 1 through stage 4 chronic kidney disease, or unspecified chronic kidney disease: Secondary | ICD-10-CM | POA: Diagnosis not present

## 2020-07-31 DIAGNOSIS — E1122 Type 2 diabetes mellitus with diabetic chronic kidney disease: Secondary | ICD-10-CM | POA: Diagnosis not present

## 2020-07-31 DIAGNOSIS — N1832 Chronic kidney disease, stage 3b: Secondary | ICD-10-CM | POA: Diagnosis not present

## 2020-07-31 DIAGNOSIS — E875 Hyperkalemia: Secondary | ICD-10-CM | POA: Diagnosis not present

## 2020-08-03 ENCOUNTER — Other Ambulatory Visit: Payer: Self-pay

## 2020-08-03 ENCOUNTER — Ambulatory Visit: Payer: Medicare HMO | Admitting: Dermatology

## 2020-08-03 DIAGNOSIS — L570 Actinic keratosis: Secondary | ICD-10-CM | POA: Diagnosis not present

## 2020-08-03 DIAGNOSIS — Z1283 Encounter for screening for malignant neoplasm of skin: Secondary | ICD-10-CM

## 2020-08-03 DIAGNOSIS — L814 Other melanin hyperpigmentation: Secondary | ICD-10-CM | POA: Diagnosis not present

## 2020-08-03 DIAGNOSIS — D1800 Hemangioma unspecified site: Secondary | ICD-10-CM | POA: Diagnosis not present

## 2020-08-03 DIAGNOSIS — L821 Other seborrheic keratosis: Secondary | ICD-10-CM | POA: Diagnosis not present

## 2020-08-03 DIAGNOSIS — D229 Melanocytic nevi, unspecified: Secondary | ICD-10-CM | POA: Diagnosis not present

## 2020-08-03 DIAGNOSIS — Z85828 Personal history of other malignant neoplasm of skin: Secondary | ICD-10-CM | POA: Diagnosis not present

## 2020-08-03 DIAGNOSIS — D692 Other nonthrombocytopenic purpura: Secondary | ICD-10-CM | POA: Diagnosis not present

## 2020-08-03 DIAGNOSIS — L578 Other skin changes due to chronic exposure to nonionizing radiation: Secondary | ICD-10-CM | POA: Diagnosis not present

## 2020-08-03 NOTE — Patient Instructions (Signed)

## 2020-08-03 NOTE — Progress Notes (Signed)
Follow-Up Visit   Subjective  Bobby Thomas is a 80 y.o. male who presents for the following: Follow-up (Patient here today for 6 month follow-up UBSE. He hasn't noticed any new spots of concern. History of Aks and SCCs.). The patient presents for Upper Body Skin Exam (UBSE) for skin cancer screening and mole check.  The following portions of the chart were reviewed this encounter and updated as appropriate:   Tobacco  Allergies  Meds  Problems  Med Hx  Surg Hx  Fam Hx      Review of Systems:  No other skin or systemic complaints except as noted in HPI or Assessment and Plan.  Objective  Well appearing patient in no apparent distress; mood and affect are within normal limits.  All skin waist up examined.  R ear x 1 Erythematous thin papules/macules with gritty scale.    Assessment & Plan  Lentigines - Scattered tan macules - Due to sun exposure - Benign-appering, observe - Recommend daily broad spectrum sunscreen SPF 30+ to sun-exposed areas, reapply every 2 hours as needed. - Call for any changes  Seborrheic Keratoses - Stuck-on, waxy, tan-brown papules and/or plaques  - Benign-appearing - Discussed benign etiology and prognosis. - Observe - Call for any changes  Melanocytic Nevi - Tan-brown and/or pink-flesh-colored symmetric macules and papules - Benign appearing on exam today - Observation - Call clinic for new or changing moles - Recommend daily use of broad spectrum spf 30+ sunscreen to sun-exposed areas.   Hemangiomas - Red papules - Discussed benign nature - Observe - Call for any changes  Actinic Damage - Chronic condition, secondary to cumulative UV/sun exposure - diffuse scaly erythematous macules with underlying dyspigmentation - Recommend daily broad spectrum sunscreen SPF 30+ to sun-exposed areas, reapply every 2 hours as needed.  - Staying in the shade or wearing long sleeves, sun glasses (UVA+UVB protection) and wide brim hats (4-inch  brim around the entire circumference of the hat) are also recommended for sun protection.  - Call for new or changing lesions.  Skin cancer screening performed today.  AK (actinic keratosis) R ear x 1  Actinic keratoses are precancerous spots that appear secondary to cumulative UV radiation exposure/sun exposure over time. They are chronic with expected duration over 1 year. A portion of actinic keratoses will progress to squamous cell carcinoma of the skin. It is not possible to reliably predict which spots will progress to skin cancer and so treatment is recommended to prevent development of skin cancer.  Recommend daily broad spectrum sunscreen SPF 30+ to sun-exposed areas, reapply every 2 hours as needed.  Recommend staying in the shade or wearing long sleeves, sun glasses (UVA+UVB protection) and wide brim hats (4-inch brim around the entire circumference of the hat). Call for new or changing lesions.  Destruction of lesion - R ear x 1 Complexity: simple   Destruction method: cryotherapy   Informed consent: discussed and consent obtained   Timeout:  patient name, date of birth, surgical site, and procedure verified Lesion destroyed using liquid nitrogen: Yes   Region frozen until ice ball extended beyond lesion: Yes   Outcome: patient tolerated procedure well with no complications   Post-procedure details: wound care instructions given   Additional details:  Prior to procedure, discussed risks of blister formation, small wound, skin dyspigmentation, or rare scar following cryotherapy. Recommend Vaseline ointment to treated areas while healing.  History of Squamous Cell Carcinoma of the Skin - No evidence of recurrence today of  the right post auricular or crown scalp - No lymphadenopathy - Recommend regular full body skin exams - Recommend daily broad spectrum sunscreen SPF 30+ to sun-exposed areas, reapply every 2 hours as needed.  - Call if any new or changing lesions are noted  between office visits  Purpura - Chronic; persistent and recurrent.  Treatable, but not curable. - Violaceous macules and patches - Benign - Related to trauma, age, sun damage and/or use of blood thinners, chronic use of topical and/or oral steroids - Observe - Can use OTC arnica containing moisturizer such as Dermend Bruise Formula if desired - Call for worsening or other concerns  Return in about 6 months (around 02/02/2021).  Lindi Adie, CMA, am acting as scribe for Sarina Ser, MD .  Documentation: I have reviewed the above documentation for accuracy and completeness, and I agree with the above.  Sarina Ser, MD

## 2020-08-12 ENCOUNTER — Encounter: Payer: Self-pay | Admitting: Dermatology

## 2020-08-16 ENCOUNTER — Ambulatory Visit: Payer: Medicare HMO | Admitting: Dermatology

## 2020-08-29 ENCOUNTER — Other Ambulatory Visit: Payer: Self-pay | Admitting: Family Medicine

## 2020-08-29 DIAGNOSIS — I779 Disorder of arteries and arterioles, unspecified: Secondary | ICD-10-CM

## 2020-08-29 NOTE — Telephone Encounter (Signed)
Requested Prescriptions  Pending Prescriptions Disp Refills  . simvastatin (ZOCOR) 20 MG tablet [Pharmacy Med Name: SIMVASTATIN 20 MG TABLET] 90 tablet 3    Sig: TAKE 1 TABLET BY MOUTH EVERY DAY     Cardiovascular:  Antilipid - Statins Failed - 08/29/2020  4:32 PM      Failed - Triglycerides in normal range and within 360 days    Triglycerides  Date Value Ref Range Status  07/14/2020 183 (H) 0 - 149 mg/dL Final  05/20/2012 154 0 - 200 mg/dL Final         Passed - Total Cholesterol in normal range and within 360 days    Cholesterol, Total  Date Value Ref Range Status  07/14/2020 153 100 - 199 mg/dL Final   Cholesterol  Date Value Ref Range Status  05/20/2012 131 0 - 200 mg/dL Final         Passed - LDL in normal range and within 360 days    Ldl Cholesterol, Calc  Date Value Ref Range Status  05/20/2012 56 0 - 100 mg/dL Final   LDL Chol Calc (NIH)  Date Value Ref Range Status  07/14/2020 75 0 - 99 mg/dL Final         Passed - HDL in normal range and within 360 days    HDL Cholesterol  Date Value Ref Range Status  05/20/2012 44 40 - 60 mg/dL Final   HDL  Date Value Ref Range Status  07/14/2020 47 >39 mg/dL Final         Passed - Patient is not pregnant      Passed - Valid encounter within last 12 months    Recent Outpatient Visits          1 month ago Annual physical exam   [facility] Birdie Sons, MD   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

## 2020-09-03 IMAGING — US US RENAL
1 series · 14 of 25 positions shown · non-contrast
Comparison: None.

CLINICAL DATA: Stage IIIB chronic kidney disease

EXAM:
RENAL / URINARY TRACT ULTRASOUND COMPLETE

[Series 1: us renal · 0.24mm/px · 14 of 26 slices shown]
[im 1/26]
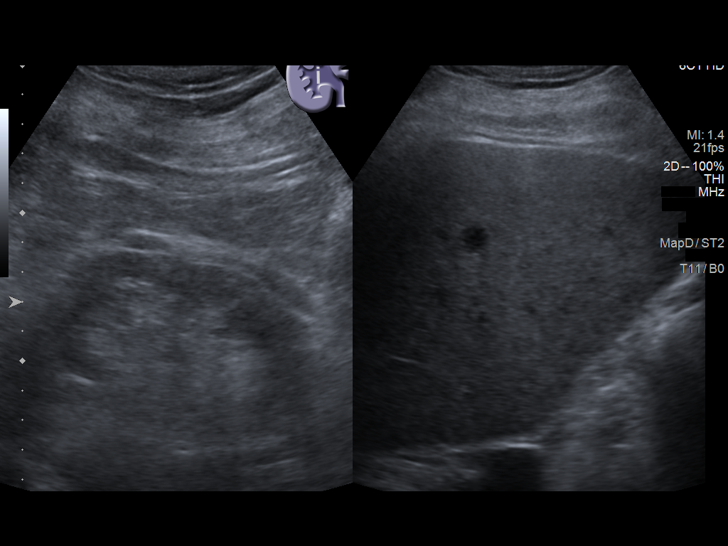
[im 3/26]
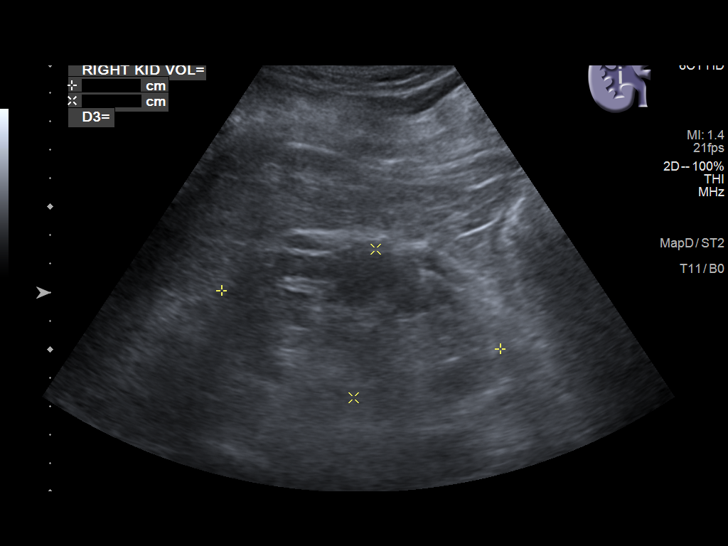
[im 5/26]
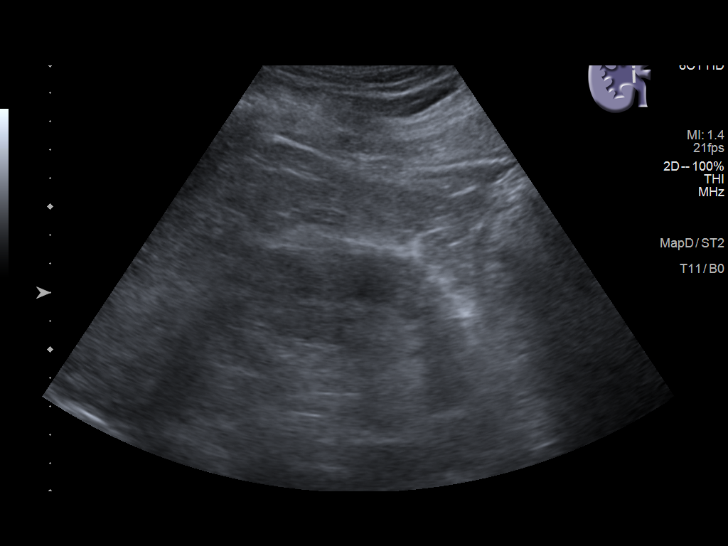
[im 7/26]
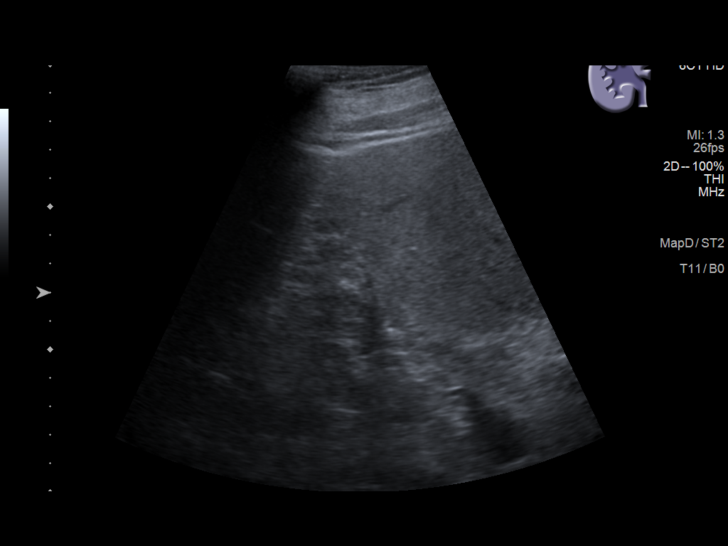
[im 9/26]
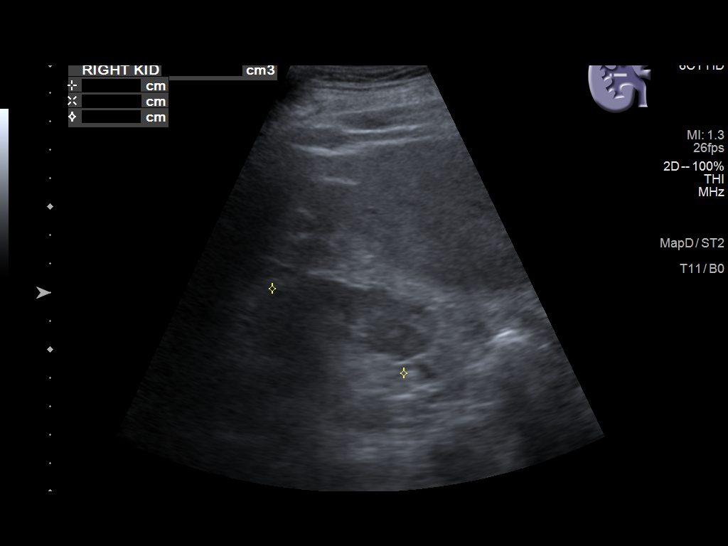
[im 10/26]
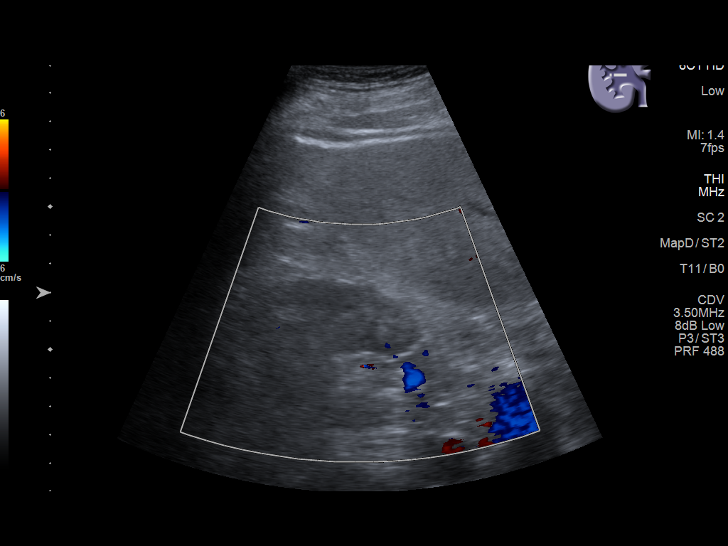
[im 12/26]
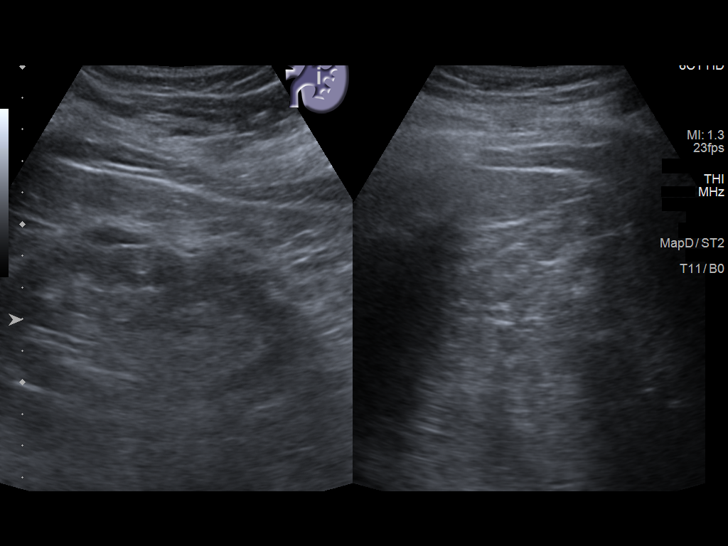
[im 14/26]
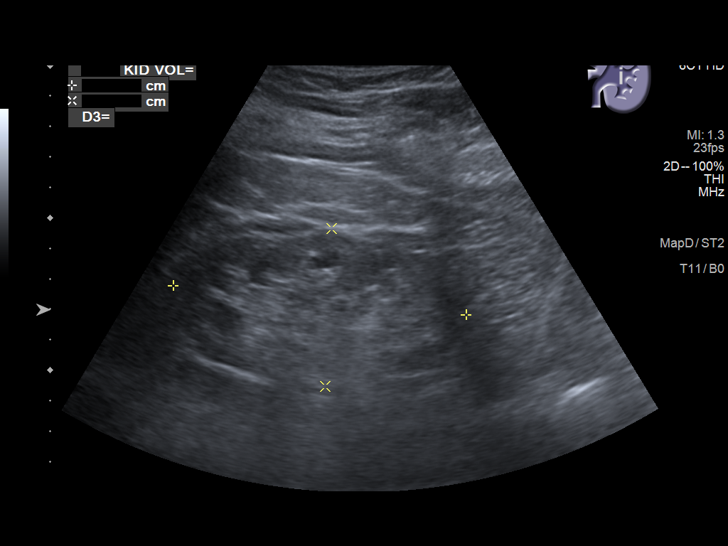
[im 16/26]
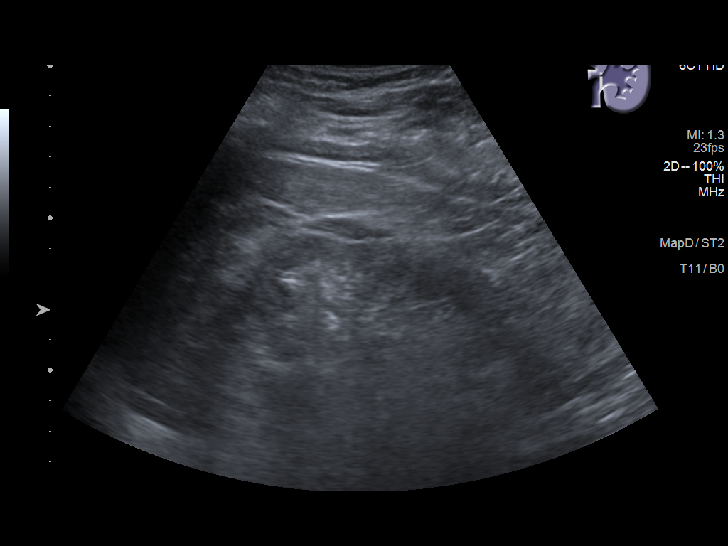
[im 17/26]
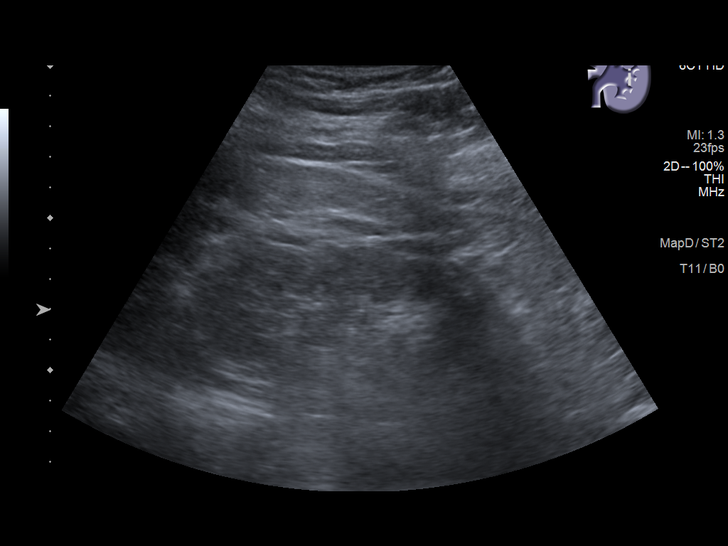
[im 19/26]
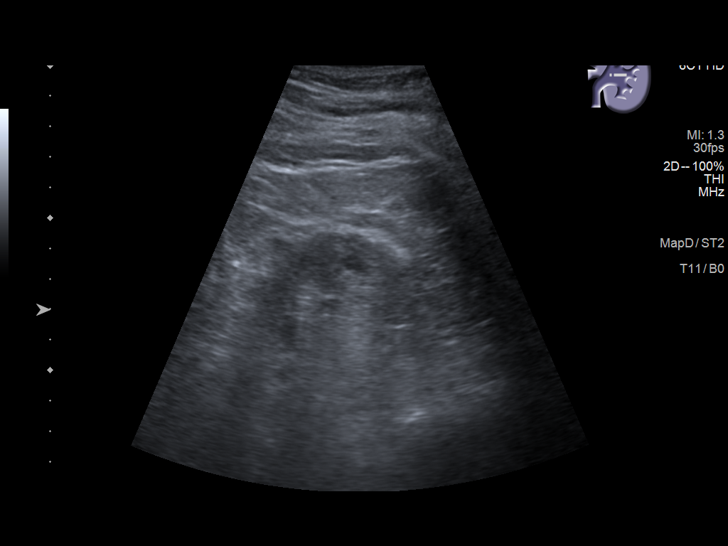
[im 21/26]
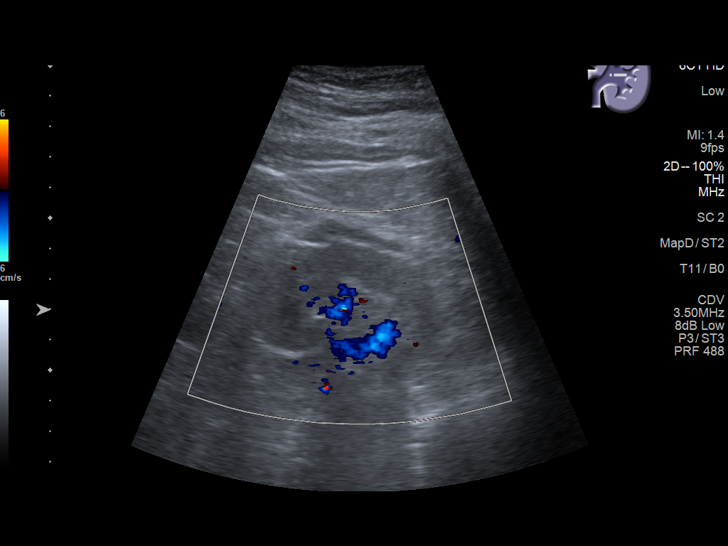
[im 23/26]
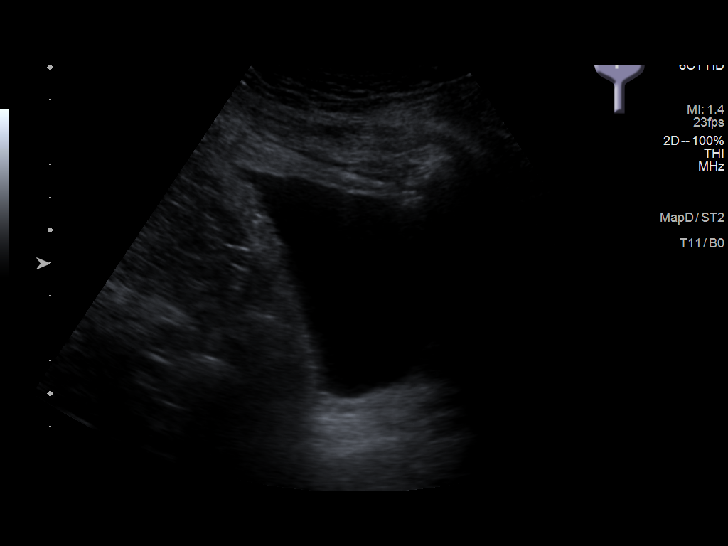
[im 26/26]
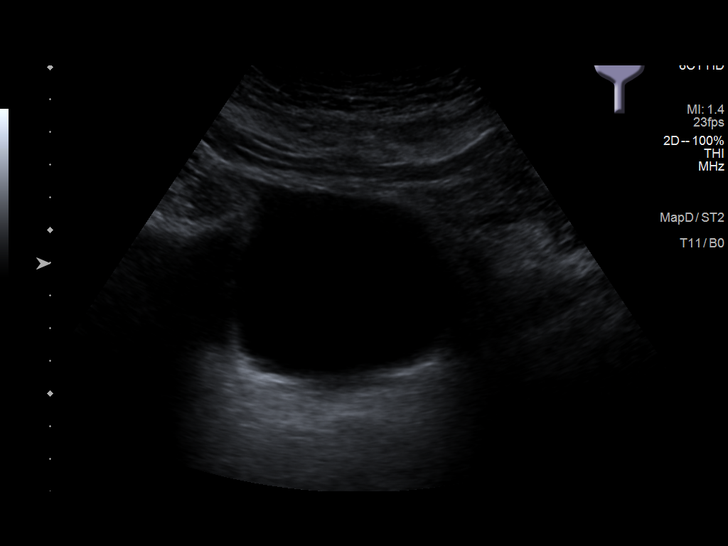

[14 of 25 positions shown; findings below may reference images not displayed]

FINDINGS: Right Kidney:

Renal measurements: 10.0 x 5.3 x 5.5 cm = volume: 151 mL. Echogenic
renal parenchyma. Cortical thinning. Trace perinephric fluid. No
hydronephrosis.

Left Kidney:

Renal measurements: 9.7 x 5.2 x 4.4 cm = volume: 114 mL. Echogenic
renal parenchyma. Cortical thinning. No hydronephrosis.

Bladder:

Appears normal for degree of bladder distention.

Other:

None.
IMPRESSION: Echogenic renal parenchyma with cortical thinning bilaterally,
suggesting medical renal disease.

No hydronephrosis.

## 2020-10-05 DIAGNOSIS — H2513 Age-related nuclear cataract, bilateral: Secondary | ICD-10-CM | POA: Diagnosis not present

## 2020-10-05 DIAGNOSIS — E119 Type 2 diabetes mellitus without complications: Secondary | ICD-10-CM | POA: Diagnosis not present

## 2020-10-05 LAB — HM DIABETES EYE EXAM

## 2020-10-08 ENCOUNTER — Other Ambulatory Visit: Payer: Self-pay | Admitting: Family Medicine

## 2020-10-08 NOTE — Telephone Encounter (Signed)
Requested Prescriptions  Pending Prescriptions Disp Refills  . metoprolol tartrate (LOPRESSOR) 25 MG tablet [Pharmacy Med Name: METOPROLOL TARTRATE 25 MG TAB] 180 tablet 0    Sig: TAKE 1 TABLET BY MOUTH TWICE A DAY     Cardiovascular:  Beta Blockers Failed - 10/08/2020  9:19 AM      Failed - Last BP in normal range    BP Readings from Last 1 Encounters:  07/14/20 (!) 166/83         Passed - Last Heart Rate in normal range    Pulse Readings from Last 1 Encounters:  07/14/20 62         Passed - Valid encounter within last 6 months    Recent Outpatient Visits          2 months ago Annual physical exam   [facility] Birdie Sons, MD   9 months 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

## 2020-10-26 DIAGNOSIS — E875 Hyperkalemia: Secondary | ICD-10-CM | POA: Diagnosis not present

## 2020-10-26 DIAGNOSIS — N2581 Secondary hyperparathyroidism of renal origin: Secondary | ICD-10-CM | POA: Diagnosis not present

## 2020-10-26 DIAGNOSIS — I129 Hypertensive chronic kidney disease with stage 1 through stage 4 chronic kidney disease, or unspecified chronic kidney disease: Secondary | ICD-10-CM | POA: Diagnosis not present

## 2020-10-26 DIAGNOSIS — R809 Proteinuria, unspecified: Secondary | ICD-10-CM | POA: Diagnosis not present

## 2020-10-26 DIAGNOSIS — E1122 Type 2 diabetes mellitus with diabetic chronic kidney disease: Secondary | ICD-10-CM | POA: Diagnosis not present

## 2020-10-26 DIAGNOSIS — N1832 Chronic kidney disease, stage 3b: Secondary | ICD-10-CM | POA: Diagnosis not present

## 2020-11-01 DIAGNOSIS — E1122 Type 2 diabetes mellitus with diabetic chronic kidney disease: Secondary | ICD-10-CM | POA: Diagnosis not present

## 2020-11-01 DIAGNOSIS — E875 Hyperkalemia: Secondary | ICD-10-CM | POA: Diagnosis not present

## 2020-11-01 DIAGNOSIS — N2581 Secondary hyperparathyroidism of renal origin: Secondary | ICD-10-CM | POA: Diagnosis not present

## 2020-11-01 DIAGNOSIS — R809 Proteinuria, unspecified: Secondary | ICD-10-CM | POA: Diagnosis not present

## 2020-11-01 DIAGNOSIS — I129 Hypertensive chronic kidney disease with stage 1 through stage 4 chronic kidney disease, or unspecified chronic kidney disease: Secondary | ICD-10-CM | POA: Diagnosis not present

## 2020-11-01 DIAGNOSIS — N1832 Chronic kidney disease, stage 3b: Secondary | ICD-10-CM | POA: Diagnosis not present

## 2020-12-07 ENCOUNTER — Other Ambulatory Visit: Payer: Self-pay | Admitting: Family Medicine

## 2020-12-07 DIAGNOSIS — E1121 Type 2 diabetes mellitus with diabetic nephropathy: Secondary | ICD-10-CM

## 2020-12-07 NOTE — Telephone Encounter (Signed)
Requested Prescriptions  Pending Prescriptions Disp Refills  . glipiZIDE (GLUCOTROL) 5 MG tablet [Pharmacy Med Name: GLIPIZIDE 5 MG TABLET] 90 tablet 0    Sig: TAKE 1 TABLET BY MOUTH EVERY DAY BEFORE BREAKFAST     Endocrinology:  Diabetes - Sulfonylureas Passed - 12/07/2020  2:01 AM      Passed - HBA1C is between 0 and 7.9 and within 180 days    Hgb A1c MFr Bld  Date Value Ref Range Status  07/14/2020 7.6 (H) 4.8 - 5.6 % Final    Comment:             Prediabetes: 5.7 - 6.4          Diabetes: >6.4          Glycemic control for adults with diabetes: <7.0          Passed - Valid encounter within last 6 months    Recent Outpatient Visits          4 months ago Annual physical exam   [facility] Birdie Sons, MD   11 months 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

## 2020-12-15 ENCOUNTER — Other Ambulatory Visit: Payer: Self-pay | Admitting: Family Medicine

## 2020-12-15 DIAGNOSIS — E1121 Type 2 diabetes mellitus with diabetic nephropathy: Secondary | ICD-10-CM

## 2020-12-16 NOTE — Telephone Encounter (Signed)
[facility] called and spoke to [place], [facility] about the refill(s) Glipizide requested. Advised it was sent on 12/07/20 #90/0 refill(s). She says it was not received. I advised I will send in the refill.  Requested Prescriptions  Pending Prescriptions Disp Refills   glipiZIDE (GLUCOTROL) 5 MG tablet [Pharmacy Med Name: GLIPIZIDE 5 MG TABLET] 90 tablet 0    Sig: TAKE 1 TABLET BY MOUTH EVERY DAY BEFORE BREAKFAST     Endocrinology:  Diabetes - Sulfonylureas Passed - 12/15/2020  4:13 PM      Passed - HBA1C is between 0 and 7.9 and within 180 days    Hgb A1c MFr Bld  Date Value Ref Range Status  07/14/2020 7.6 (H) 4.8 - 5.6 % Final    Comment:             Prediabetes: 5.7 - 6.4          Diabetes: >6.4          Glycemic control for adults with diabetes: <7.0           Passed - Valid encounter within last 6 months    Recent Outpatient Visits           5 months ago Annual physical exam   [facility] Birdie Sons, MD   11 months 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[facility])   [facility] Birdie Sons, MD   1 year ago Annual physical exam   [facility] Birdie Sons, MD   2 years ago Hyperkalemia   [facility] Birdie Sons, MD   2 years 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[facility])   [facility] Birdie Sons, MD       Future Appointments             In 2 months Ralene Bathe, MD [place]            Signed Prescriptions Disp Refills   metoprolol tartrate (LOPRESSOR) 25 MG tablet 180 tablet 0    Sig: TAKE 1 TABLET BY MOUTH TWICE A DAY     Cardiovascular:  Beta Blockers Failed - 12/15/2020  4:13 PM      Failed - Last BP in normal range    BP Readings from Last 1 Encounters:  07/14/20 (!) 166/83          Passed - Last Heart Rate in normal range    Pulse Readings from Last 1 Encounters:  07/14/20 62          Passed - Valid encounter within last 6 months     Recent Outpatient Visits           5 months ago Annual physical exam   [facility] Birdie Sons, MD   11 months 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

## 2020-12-16 NOTE — Telephone Encounter (Signed)
Requested Prescriptions  Pending Prescriptions Disp Refills  . metoprolol tartrate (LOPRESSOR) 25 MG tablet [Pharmacy Med Name: METOPROLOL TARTRATE 25 MG TAB] 180 tablet 0    Sig: TAKE 1 TABLET BY MOUTH TWICE A DAY     Cardiovascular:  Beta Blockers Failed - 12/15/2020  4:13 PM      Failed - Last BP in normal range    BP Readings from Last 1 Encounters:  07/14/20 (!) 166/83         Passed - Last Heart Rate in normal range    Pulse Readings from Last 1 Encounters:  07/14/20 62         Passed - Valid encounter within last 6 months    Recent Outpatient Visits          5 months ago Annual physical exam   [facility] Birdie Sons, MD   11 months 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 glipiZIDE (GLUCOTROL) 5 MG tablet [Pharmacy Med Name: GLIPIZIDE 5 MG TABLET] 90 tablet 0    Sig: TAKE 1 TABLET BY MOUTH EVERY DAY BEFORE BREAKFAST     Endocrinology:  Diabetes - Sulfonylureas Passed - 12/15/2020  4:13 PM      Passed - HBA1C is between 0 and 7.9 and within 180 days    Hgb A1c MFr Bld  Date Value Ref Range Status  07/14/2020 7.6 (H) 4.8 - 5.6 % Final    Comment:             Prediabetes: 5.7 - 6.4          Diabetes: >6.4          Glycemic control for adults with diabetes: <7.0          Passed - Valid encounter within last 6 months    Recent Outpatient Visits          5 months ago Annual physical exam   [facility] Birdie Sons, MD   11 months 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

## 2020-12-27 ENCOUNTER — Encounter: Payer: Self-pay | Admitting: Family Medicine

## 2021-01-31 ENCOUNTER — Other Ambulatory Visit: Payer: Self-pay | Admitting: Family Medicine

## 2021-02-01 ENCOUNTER — Other Ambulatory Visit: Payer: Self-pay | Admitting: Family Medicine

## 2021-02-01 DIAGNOSIS — E1121 Type 2 diabetes mellitus with diabetic nephropathy: Secondary | ICD-10-CM

## 2021-02-01 NOTE — Telephone Encounter (Signed)
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

## 2021-02-06 DIAGNOSIS — E875 Hyperkalemia: Secondary | ICD-10-CM | POA: Diagnosis not present

## 2021-02-06 DIAGNOSIS — E1122 Type 2 diabetes mellitus with diabetic chronic kidney disease: Secondary | ICD-10-CM | POA: Diagnosis not present

## 2021-02-06 DIAGNOSIS — R809 Proteinuria, unspecified: Secondary | ICD-10-CM | POA: Diagnosis not present

## 2021-02-06 DIAGNOSIS — N1832 Chronic kidney disease, stage 3b: Secondary | ICD-10-CM | POA: Diagnosis not present

## 2021-02-06 DIAGNOSIS — I129 Hypertensive chronic kidney disease with stage 1 through stage 4 chronic kidney disease, or unspecified chronic kidney disease: Secondary | ICD-10-CM | POA: Diagnosis not present

## 2021-02-06 DIAGNOSIS — N2581 Secondary hyperparathyroidism of renal origin: Secondary | ICD-10-CM | POA: Diagnosis not present

## 2021-02-06 LAB — MICROALBUMIN / CREATININE URINE RATIO: Microalb Creat Ratio: 0.521

## 2021-02-12 DIAGNOSIS — R809 Proteinuria, unspecified: Secondary | ICD-10-CM | POA: Diagnosis not present

## 2021-02-12 DIAGNOSIS — N2581 Secondary hyperparathyroidism of renal origin: Secondary | ICD-10-CM | POA: Diagnosis not present

## 2021-02-12 DIAGNOSIS — E1122 Type 2 diabetes mellitus with diabetic chronic kidney disease: Secondary | ICD-10-CM | POA: Diagnosis not present

## 2021-02-12 DIAGNOSIS — E875 Hyperkalemia: Secondary | ICD-10-CM | POA: Diagnosis not present

## 2021-02-12 DIAGNOSIS — N1832 Chronic kidney disease, stage 3b: Secondary | ICD-10-CM | POA: Diagnosis not present

## 2021-02-12 DIAGNOSIS — I129 Hypertensive chronic kidney disease with stage 1 through stage 4 chronic kidney disease, or unspecified chronic kidney disease: Secondary | ICD-10-CM | POA: Diagnosis not present

## 2021-02-13 ENCOUNTER — Encounter: Payer: Self-pay | Admitting: Family Medicine

## 2021-02-15 ENCOUNTER — Telehealth: Payer: Self-pay

## 2021-02-15 ENCOUNTER — Other Ambulatory Visit: Payer: Self-pay

## 2021-02-15 ENCOUNTER — Ambulatory Visit: Payer: Medicare HMO | Admitting: Dermatology

## 2021-02-15 DIAGNOSIS — D692 Other nonthrombocytopenic purpura: Secondary | ICD-10-CM

## 2021-02-15 DIAGNOSIS — Z872 Personal history of diseases of the skin and subcutaneous tissue: Secondary | ICD-10-CM | POA: Diagnosis not present

## 2021-02-15 DIAGNOSIS — L821 Other seborrheic keratosis: Secondary | ICD-10-CM

## 2021-02-15 DIAGNOSIS — L300 Nummular dermatitis: Secondary | ICD-10-CM | POA: Diagnosis not present

## 2021-02-15 DIAGNOSIS — L570 Actinic keratosis: Secondary | ICD-10-CM | POA: Diagnosis not present

## 2021-02-15 DIAGNOSIS — L820 Inflamed seborrheic keratosis: Secondary | ICD-10-CM

## 2021-02-15 DIAGNOSIS — L578 Other skin changes due to chronic exposure to nonionizing radiation: Secondary | ICD-10-CM | POA: Diagnosis not present

## 2021-02-15 MED ORDER — MOMETASONE FUROATE 0.1 % EX CREA: 1.0000 "application " | TOPICAL_CREAM | CUTANEOUS | 1 refills | Status: DC

## 2021-02-15 NOTE — Telephone Encounter (Signed)
ERROR

## 2021-02-15 NOTE — Progress Notes (Signed)
Follow-Up Visit   Subjective  Bobby Thomas is a 81 y.o. male who presents for the following: Follow-up (Patient here today for 6 month follow up. Patient has history of aks. Patient would like checked scalp and shoulders. ). The patient has spots, moles and lesions to be evaluated, some may be new or changing and the patient has concerns that these could be cancer. The patient also has a rash on the shoulders he would like checked.  The following portions of the chart were reviewed this encounter and updated as appropriate:  Tobacco   Allergies   Meds   Problems   Med Hx   Surg Hx   Fam Hx      Review of Systems: No other skin or systemic complaints except as noted in HPI or Assessment and Plan.  Objective  Well appearing patient in no apparent distress; mood and affect are within normal limits.  A focused examination was performed including scalp, arms,  shoulders. Relevant physical exam findings are noted in the Assessment and Plan.  Scalp x 4 (4) Erythematous thin papules/macules with gritty scale.   Scalp x 1 Erythematous stuck-on, waxy papule or plaque  Right Shoulder - Anterior Nummular pink patches at shoulders    Assessment & Plan  Actinic keratosis (4) Scalp x 4  Actinic keratoses are precancerous spots that appear secondary to cumulative UV radiation exposure/sun exposure over time. They are chronic with expected duration over 1 year. A portion of actinic keratoses will progress to squamous cell carcinoma of the skin. It is not possible to reliably predict which spots will progress to skin cancer and so treatment is recommended to prevent development of skin cancer.  Recommend daily broad spectrum sunscreen SPF 30+ to sun-exposed areas, reapply every 2 hours as needed.  Recommend staying in the shade or wearing long sleeves, sun glasses (UVA+UVB protection) and wide brim hats (4-inch brim around the entire circumference of the hat). Call for new or changing  lesions.  Destruction of lesion - Scalp x 4 Complexity: simple   Destruction method: cryotherapy   Informed consent: discussed and consent obtained   Timeout:  patient name, date of birth, surgical site, and procedure verified Lesion destroyed using liquid nitrogen: Yes   Region frozen until ice ball extended beyond lesion: Yes   Outcome: patient tolerated procedure well with no complications   Post-procedure details: wound care instructions given   Additional details:  Prior to procedure, discussed risks of blister formation, small wound, skin dyspigmentation, or rare scar following cryotherapy. Recommend Vaseline ointment to treated areas while healing.   Inflamed seborrheic keratosis Scalp x 1  Irritated   Destruction of lesion - Scalp x 1 Complexity: simple   Destruction method: cryotherapy   Informed consent: discussed and consent obtained   Timeout:  patient name, date of birth, surgical site, and procedure verified Lesion destroyed using liquid nitrogen: Yes   Region frozen until ice ball extended beyond lesion: Yes   Outcome: patient tolerated procedure well with no complications   Post-procedure details: wound care instructions given   Additional details:  Prior to procedure, discussed risks of blister formation, small wound, skin dyspigmentation, or rare scar following cryotherapy. Recommend Vaseline ointment to treated areas while healing.   Nummular dermatitis Right Shoulder - Anterior  Atopic dermatitis (eczema) is a chronic, relapsing, pruritic condition that can significantly affect quality of life. It is often associated with allergic rhinitis and/or asthma and can require treatment with topical medications, phototherapy, or  in severe cases biologic injectable medication (Dupixent; Adbry) or Oral JAK inhibitors.   Start mometasone 0.1 % cream - apply to affected area at shoulder 1- 2 times daily as needed for nummular dermatitis.   mometasone (ELOCON) 0.1 % cream  - Right Shoulder - Anterior Apply 1 application topically See admin instructions. Use 1 - 2 times daily as needed for itchy pink patches at shoulders  Purpura - Chronic; persistent and recurrent.  Treatable, but not curable. - Violaceous macules and patches - Benign - Related to trauma, age, sun damage and/or use of blood thinners, chronic use of topical and/or oral steroids - Observe - Can use OTC arnica containing moisturizer such as Dermend Bruise Formula if desired - Call for worsening or other concerns  Seborrheic Keratoses - Stuck-on, waxy, tan-brown papules and/or plaques  - Benign-appearing - Discussed benign etiology and prognosis. - Observe - Call for any changes  Actinic Damage - chronic, secondary to cumulative UV radiation exposure/sun exposure over time - diffuse scaly erythematous macules with underlying dyspigmentation - Recommend daily broad spectrum sunscreen SPF 30+ to sun-exposed areas, reapply every 2 hours as needed.  - Recommend staying in the shade or wearing long sleeves, sun glasses (UVA+UVB protection) and wide brim hats (4-inch brim around the entire circumference of the hat). - Call for new or changing lesions.  Return for 6 month ak /nummular derm follow up.  IRuthell Rummage, CMA, am acting as scribe for Sarina Ser, MD. Documentation: I have reviewed the above documentation for accuracy and completeness, and I agree with the above.  Sarina Ser, MD

## 2021-02-15 NOTE — Patient Instructions (Addendum)
Topical steroids (such as triamcinolone, fluocinolone, fluocinonide, mometasone, clobetasol, halobetasol, betamethasone, hydrocortisone) can cause thinning and lightening of the skin if they are used for too long in the same area. Your physician has selected the right strength medicine for your problem and area affected on the body. Please use your medication only as directed by your physician to prevent side effects.      Actinic keratoses are precancerous spots that appear secondary to cumulative UV radiation exposure/sun exposure over time. They are chronic with expected duration over 1 year. A portion of actinic keratoses will progress to squamous cell carcinoma of the skin. It is not possible to reliably predict which spots will progress to skin cancer and so treatment is recommended to prevent development of skin cancer.  Recommend daily broad spectrum sunscreen SPF 30+ to sun-exposed areas, reapply every 2 hours as needed.  Recommend staying in the shade or wearing long sleeves, sun glasses (UVA+UVB protection) and wide brim hats (4-inch brim around the entire circumference of the hat). Call for new or changing lesions.   Cryotherapy Aftercare  Wash gently with soap and water everyday.   Apply Vaseline and Band-Aid daily until healed.    Seborrheic Keratosis  What causes seborrheic keratoses? Seborrheic keratoses are harmless, common skin growths that first appear during adult life.  As time goes by, more growths appear.  Some people may develop a large number of them.  Seborrheic keratoses appear on both covered and uncovered body parts.  They are not caused by sunlight.  The tendency to develop seborrheic keratoses can be inherited.  They vary in color from skin-colored to gray, brown, or even black.  They can be either smooth or have a rough, warty surface.   Seborrheic keratoses are superficial and look as if they were stuck on the skin.  Under the microscope this type of keratosis  looks like layers upon layers of skin.  That is why at times the top layer may seem to fall off, but the rest of the growth remains and re-grows.    Treatment Seborrheic keratoses do not need to be treated, but can easily be removed in the office.  Seborrheic keratoses often cause symptoms when they rub on clothing or jewelry.  Lesions can be in the way of shaving.  If they become inflamed, they can cause itching, soreness, or burning.  Removal of a seborrheic keratosis can be accomplished by freezing, burning, or surgery. If any spot bleeds, scabs, or grows rapidly, please return to have it checked, as these can be an indication of a skin cancer.       If You Need Anything After Your Visit  If you have any questions or concerns for your doctor, please call our main line at [phone] and press option 4 to reach your doctor's medical assistant. If no one answers, please leave a voicemail as directed and we will return your call as soon as possible. Messages left after 4 pm will be answered the following business day.   You may also send [place] a message via [place]. We typically respond to MyChart messages within 1-2 business days.  For prescription refills, please ask your pharmacy to contact our office. Our fax number is [phone].  If you have an urgent issue when the clinic is closed that cannot wait until the next business day, you can page your doctor at the number below.    Please note that while we do our best to be available for urgent issues  outside of office hours, we are not available 24/7.   If you have an urgent issue and are unable to reach [place], you may choose to seek medical care at your doctor's office, retail clinic, urgent care center, or emergency room.  If you have a medical emergency, please immediately call 911 or go to the emergency department.  Pager Numbers  - Dr. Nehemiah Massed: [phone]  - Dr. Laurence Ferrari: [phone]  - Dr. Nicole Kindred: [phone]  In the event  of inclement weather, please call our main line at [phone] for an update on the status of any delays or closures.  Dermatology Medication Tips: Please keep the boxes that topical medications come in in order to help keep track of the instructions about where and how to use these. Pharmacies typically print the medication instructions only on the boxes and not directly on the medication tubes.   If your medication is too expensive, please contact our office at [phone] option 4 or send [place] a message through [place].   We are unable to tell what your co-pay for medications will be in advance as this is different depending on your insurance coverage. However, we may be able to find a substitute medication at lower cost or fill out paperwork to get insurance to cover a needed medication.   If a prior authorization is required to get your medication covered by your insurance company, please allow [place] 1-2 business days to complete this process.  Drug prices often vary depending on where the prescription is filled and some pharmacies may offer cheaper prices.  The website www.goodrx.com contains coupons for medications through different pharmacies. The prices here do not account for what the cost may be with help from insurance (it may be cheaper with your insurance), but the website can give you the price if you did not use any insurance.  - You can print the associated coupon and take it with your prescription to the pharmacy.  - You may also stop by our office during regular business hours and pick up a GoodRx coupon card.  - If you need your prescription sent electronically to a different pharmacy, notify our office through [facility] or by phone at [phone] option 4.     Si Usted Necesita Algo Despus de Su Visita  Tambin puede enviarnos un mensaje a travs de Pharmacist, community. Por lo general respondemos a los mensajes de MyChart en el transcurso de 1 a 2 das hbiles.  Para  renovar recetas, por favor pida a su farmacia que se ponga en contacto con nuestra oficina. Harland Dingwall de fax es [place] [phone].  Si tiene un asunto urgente cuando la clnica est cerrada y que no puede esperar hasta el siguiente da hbil, puede llamar/localizar a su doctor(a) al nmero que aparece a continuacin.   Por favor, tenga en cuenta que aunque hacemos todo lo posible para estar disponibles para asuntos urgentes fuera del horario de [place], no estamos disponibles las 24 horas del da, los 7 das de la [place].   Si tiene un problema urgente y no puede comunicarse con nosotros, puede optar por buscar atencin mdica  en el consultorio de su doctor(a), en una clnica privada, en un centro de atencin urgente o en una sala de emergencias.  Si tiene Engineering geologist, por favor llame inmediatamente al 911 o vaya a la sala de emergencias.  Nmeros de bper  - Dr. Nehemiah Massed: [phone]  - Dra. Moye: [phone]  - Dra. Nicole Kindred: [phone]  Cedric Fishman  de inclemencias del tiempo, por favor llame a nuestra lnea principal al [phone] para una actualizacin sobre el [place] de cualquier retraso o cierre.  Consejos para la medicacin en dermatologa: Por favor, guarde las cajas en las que vienen los medicamentos de uso tpico para ayudarle a seguir las instrucciones sobre dnde y cmo usarlos. Las farmacias generalmente imprimen las instrucciones del medicamento slo en las cajas y no directamente en los tubos del [place].   Si su medicamento es muy caro, por favor, pngase en contacto con Zigmund Daniel llamando al [phone] y presione la opcin 4 o envenos un mensaje a travs de Pharmacist, community.   No podemos decirle cul ser su copago por los medicamentos por adelantado ya que esto es diferente dependiendo de la cobertura de su seguro. Sin embargo, es posible que podamos encontrar un medicamento sustituto a Electrical engineer un formulario para que el seguro cubra el  medicamento que se considera necesario.   Si se requiere una autorizacin previa para que su compaa de seguros [place] su medicamento, por favor permtanos de 1 a 2 das hbiles para completar este proceso.  Los precios de los medicamentos varan con frecuencia dependiendo del Environmental consultant de dnde se surte la receta y alguna farmacias pueden ofrecer precios ms baratos.  El sitio web www.goodrx.com tiene cupones para medicamentos de Airline pilot. Los precios aqu no tienen en cuenta lo que podra costar con la ayuda del seguro (puede ser ms barato con su seguro), pero el sitio web puede darle el precio si no utiliz Research scientist (physical sciences).  - Puede imprimir el cupn correspondiente y llevarlo con su receta a la farmacia.  - Tambin puede pasar por nuestra oficina durante el horario de atencin regular y Charity fundraiser una tarjeta de cupones de GoodRx.  - Si necesita que su receta se enve electrnicamente a una farmacia diferente, informe a nuestra oficina a travs de MyChart de [place] o por telfono llamando al [phone] y presione la opcin 4.

## 2021-02-18 ENCOUNTER — Encounter: Payer: Self-pay | Admitting: Dermatology

## 2021-02-26 ENCOUNTER — Ambulatory Visit (INDEPENDENT_AMBULATORY_CARE_PROVIDER_SITE_OTHER): Payer: Medicare HMO | Admitting: Family Medicine

## 2021-02-26 ENCOUNTER — Encounter: Payer: Self-pay | Admitting: Family Medicine

## 2021-02-26 ENCOUNTER — Other Ambulatory Visit: Payer: Self-pay

## 2021-02-26 VITALS — BP 151/76 | HR 62 | Temp 97.6°F | Ht 65.0 in | Wt 170.9 lb

## 2021-02-26 DIAGNOSIS — N184 Chronic kidney disease, stage 4 (severe): Secondary | ICD-10-CM

## 2021-02-26 DIAGNOSIS — E1121 Type 2 diabetes mellitus with diabetic nephropathy: Secondary | ICD-10-CM

## 2021-02-26 DIAGNOSIS — I10 Essential (primary) hypertension: Secondary | ICD-10-CM

## 2021-02-26 LAB — POCT GLYCOSYLATED HEMOGLOBIN (HGB A1C): Hemoglobin A1C: 7.4 % — AB (ref 4.0–5.6)

## 2021-02-26 MED ORDER — METFORMIN HCL 850 MG PO TABS: 850.0000 mg | ORAL_TABLET | Freq: Every evening | ORAL | Status: DC

## 2021-02-26 MED ORDER — GLIPIZIDE 10 MG PO TABS: 10.0000 mg | ORAL_TABLET | Freq: Every day | ORAL | 3 refills | Status: DC

## 2021-02-26 NOTE — Progress Notes (Signed)
Established patient visit   [name]   DOB: 22-Jun-1940   81 y.o. Male  MRN: [id number] Visit Date: 02/26/2021  Today's healthcare provider: Lelon Huh, MD   No chief complaint on file.  Subjective    HPI  Diabetes Mellitus Type II, follow-up  Lab Results  Component Value Date   HGBA1C 7.4 (A) 02/26/2021   HGBA1C 7.6 (H) 07/14/2020   HGBA1C 7.3 (A) 01/10/2020   Last seen for diabetes 6 months ago.  Management since then includes continuing the same treatment. He reports excellent compliance with treatment. He is not having side effects.   Home blood sugar records: fasting range: 130-160  Episodes of hypoglycemia? No    Current insulin regiment: none Most Recent Eye Exam: September 2022  He had follow up with Dr. Dallie Dad 2 weeks ago and eGFR is noted to have been 27  --------------------------------------------------------------------------------------------------- Hypertension, follow-up  BP Readings from Last 3 Encounters:  02/26/21 (!) 151/76  07/14/20 (!) 166/83  01/10/20 129/74   Wt Readings from Last 3 Encounters:  02/26/21 170 lb 14.4 oz (77.5 kg)  07/14/20 171 lb 9.6 oz (77.8 kg)  01/10/20 180 lb (81.6 kg)     He was last seen for hypertension 7 months ago.  BP at that visit was 166/83. Management since that visit includes continue current medication. He reports excellent compliance with treatment. He is not having side effects.  He is exercising. He is adherent to low salt diet.   Outside blood pressures are .  He does not smoke.  Use of agents associated with hypertension: none.   --------------------------------------------------------------------------------------------------- Lipid/Cholesterol, follow-up  Last Lipid Panel: Lab Results  Component Value Date   CHOL 153 07/14/2020   LDLCALC 75 07/14/2020   HDL 47 07/14/2020   TRIG 183 (H) 07/14/2020    He was last seen for this 7 months ago.  Management since  that visit includes continue current treatment.  He reports excellent compliance with treatment. He is not having side effects.   Symptoms: No appetite changes No foot ulcerations  No chest pain No chest pressure/discomfort  No dyspnea No orthopnea  No fatigue No lower extremity edema  No palpitations No paroxysmal nocturnal dyspnea  No nausea No numbness or tingling of extremity  No polydipsia No polyuria  No speech difficulty No syncope   He is following a Low fat, Diabetic diet. Current exercise: walking   The 10-year ASCVD risk score (Arnett DK, et al., 2019) is: 63.6%  ---------------------------------------------------------------------------------------------------   Medications: Outpatient Medications Prior to Visit  Medication Sig   aspirin EC 81 MG tablet Take 81 mg by mouth daily.   clopidogrel (PLAVIX) 75 MG tablet Take 1 tablet (75 mg total) by mouth daily.   EPINEPHrine 0.3 mg/0.3 mL IJ SOAJ injection as needed.    glipiZIDE (GLUCOTROL) 5 MG tablet TAKE 1 TABLET BY MOUTH EVERY DAY BEFORE BREAKFAST   LORazepam (ATIVAN) 1 MG tablet TAKE 1/2 TO 1 TABLET BY MOUTH NIGHTLY AT BEDTIME   metFORMIN (GLUCOPHAGE) 850 MG tablet Take 1 tablet (850 mg total) by mouth 2 (two) times daily with a meal.   metoprolol tartrate (LOPRESSOR) 25 MG tablet TAKE 1 TABLET BY MOUTH TWICE A DAY   ONETOUCH ULTRA test strip USE TO CHECK BLOOD SUGAR DAILY FOR TYPE 2 DIABETES DX: E11.9   simvastatin (ZOCOR) 20 MG tablet TAKE 1 TABLET BY MOUTH EVERY DAY   furosemide (LASIX) 40 MG tablet Take by mouth.  mometasone (ELOCON) 0.1 % cream Apply 1 application topically See admin instructions. Use 1 - 2 times daily as needed for itchy pink patches at shoulders   No facility-administered medications prior to visit.    Review of Systems     Objective    BP (!) 151/76 (BP Location: Right Arm, Patient Position: Sitting, Cuff Size: Normal)    Pulse 62    Temp 97.6 F (36.4 C) (Oral)    Ht _0   (1.651 m)    Wt 170 lb 14.4 oz (77.5 kg)    SpO2 96%    BMI 28.44 kg/m  {Show previous vital signs (optional):23777}  Physical Exam   General: Appearance:     Well developed, well nourished male in no acute distress  Eyes:    PERRL, conjunctiva/corneas clear, EOM's intact       Lungs:     Clear to auscultation bilaterally, respirations unlabored  Heart:    Normal heart rate. Irregularly irregular rhythm. No murmurs, rubs, or gallops.    MS:   All extremities are intact.    Neurologic:   Awake, alert, oriented x 3. No apparent focal neurological defect.        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 Will likely need to discontinue unless kidney functions improve at follow up.   Will increase glipiZIDE (GLUCOTROL) to 10 MG tablet; Take 1 tablet (10 mg total) by mouth daily before breakfast.  Dispense: 90 tablet; Refill: 3  Apparently Dr. Milana Na started him on brief trial of Farxiga but was discontinued primarily due cost, and they did not see an improvement in eGFR while he was taking it Consider another trial and advised we could get samples or drug assistance if so.   2. Essential hypertension Stable. Home Bps usually better.   3. Chronic kidney disease (CKD), active medical management without dialysis, stage 4 (severe) ([place]) Next follow up with Dr. Juleen China in in May. Will follow up here in May.       The entirety of the information documented in the History of Present Illness, Review of Systems and Physical Exam were personally obtained by me. Portions of this information were initially documented by the CMA and reviewed by me for thoroughness and accuracy.     Lelon Huh, MD   [facility] [phone] (phone) [phone] (fax)  [place]

## 2021-03-05 ENCOUNTER — Encounter: Payer: Self-pay | Admitting: Family Medicine

## 2021-03-09 ENCOUNTER — Other Ambulatory Visit: Payer: Self-pay | Admitting: Family Medicine

## 2021-03-09 DIAGNOSIS — E1121 Type 2 diabetes mellitus with diabetic nephropathy: Secondary | ICD-10-CM

## 2021-03-09 MED ORDER — PIOGLITAZONE HCL 30 MG PO TABS: 30.0000 mg | ORAL_TABLET | Freq: Every day | ORAL | 3 refills | Status: DC

## 2021-03-22 ENCOUNTER — Other Ambulatory Visit: Payer: Self-pay | Admitting: Family Medicine

## 2021-03-22 NOTE — Telephone Encounter (Signed)
Requested Prescriptions  Pending Prescriptions Disp Refills   metoprolol tartrate (LOPRESSOR) 25 MG tablet [Pharmacy Med Name: METOPROLOL TARTRATE 25 MG TAB] 180 tablet 1    Sig: TAKE 1 TABLET BY MOUTH TWICE A DAY     Cardiovascular:  Beta Blockers Failed - 03/22/2021  1:52 AM      Failed - Last BP in normal range    BP Readings from Last 1 Encounters:  02/26/21 (!) 151/76         Passed - Last Heart Rate in normal range    Pulse Readings from Last 1 Encounters:  02/26/21 62         Passed - Valid encounter within last 6 months    Recent Outpatient Visits          3 weeks 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[facility])   [facility] Birdie Sons, MD   8 months ago Annual physical exam   [facility] Birdie Sons, MD   1 year 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[facility])   [facility] Birdie Sons, MD   1 year ago Annual physical exam   [facility] Birdie Sons, MD   2 years ago Hyperkalemia   [facility] Birdie Sons, MD      Future Appointments            In 3 months Fisher, Kirstie Peri, MD [facility], [place]   In 5 months Ralene Bathe, MD [place]

## 2021-03-23 ENCOUNTER — Other Ambulatory Visit: Payer: Self-pay | Admitting: Family Medicine

## 2021-03-23 DIAGNOSIS — E1121 Type 2 diabetes mellitus with diabetic nephropathy: Secondary | ICD-10-CM

## 2021-05-01 ENCOUNTER — Other Ambulatory Visit: Payer: Self-pay | Admitting: Family Medicine

## 2021-05-10 ENCOUNTER — Ambulatory Visit (INDEPENDENT_AMBULATORY_CARE_PROVIDER_SITE_OTHER): Payer: Medicare HMO | Admitting: Family Medicine

## 2021-05-10 ENCOUNTER — Encounter: Payer: Self-pay | Admitting: Family Medicine

## 2021-05-10 VITALS — BP 182/83 | HR 73 | Temp 96.8°F | Resp 16 | Wt 177.5 lb

## 2021-05-10 DIAGNOSIS — T733XXA Exhaustion due to excessive exertion, initial encounter: Secondary | ICD-10-CM

## 2021-05-10 DIAGNOSIS — I10 Essential (primary) hypertension: Secondary | ICD-10-CM

## 2021-05-10 DIAGNOSIS — R5383 Other fatigue: Secondary | ICD-10-CM | POA: Diagnosis not present

## 2021-05-10 DIAGNOSIS — Z1211 Encounter for screening for malignant neoplasm of colon: Secondary | ICD-10-CM

## 2021-05-10 DIAGNOSIS — M79605 Pain in left leg: Secondary | ICD-10-CM

## 2021-05-10 DIAGNOSIS — M79604 Pain in right leg: Secondary | ICD-10-CM

## 2021-05-10 NOTE — Assessment & Plan Note (Signed)
Chronic, elevated; recommend return to cardiology to advance medication to reach goal <130/<80 ?Denies CP ?Denies SOB/ DOE ?Denies low blood pressure/hypotension ?Denies vision changes ?No LE Edema noted on exam ?Continue medication, Metop 25 mg BID ?Seek emergent care if you develop chest pain or chest pressure ? ?

## 2021-05-10 NOTE — Progress Notes (Signed)
?  ?[employer] as a Education administrator for Gwyneth Sprout, FNP.,have documented all relevant documentation on the behalf of Gwyneth Sprout, FNP,as directed by  Gwyneth Sprout, FNP while in the presence of Gwyneth Sprout, FNP.  ? ?Established patient visit ? ?[name]   DOB: [birth year]   81 y.o. Male  MRN: [id number] ?Visit Date: 05/10/2021 ? ?Today's healthcare provider: Gwyneth Sprout, FNP  ? ?Introduced to Designer, jewellery role and practice setting.  All questions answered.  Discussed provider/patient relationship and expectations. ? ?Chief Complaint  ?Patient presents with  ? Fatigue  ?  Patient  comes in office today with concern of symptom of fatigue that has been occurring for the past 2 weeks or more, patient reports that fatigue is affecting him from doing daily tasks. Patient denies any recent change in medication or difficulty concentrating.  ? ?Subjective  ?  ?HPI ?HPI   ? ? Fatigue   ? Additional comments: Patient  comes in office today with concern of symptom of fatigue that has been occurring for the past 2 weeks or more, patient reports that fatigue is affecting him from doing daily tasks. Patient denies any recent change in medication or difficulty concentrating. ? ?  ?  ?Last edited by Minette Headland, CMA on 05/10/2021 10:48 AM.  ?  ?  ?Medications: ?Outpatient Medications Prior to Visit  ?Medication Sig  ? aspirin EC 81 MG tablet Take 81 mg by mouth daily.  ? clopidogrel (PLAVIX) 75 MG tablet Take 1 tablet (75 mg total) by mouth daily.  ? EPINEPHrine 0.3 mg/0.3 mL IJ SOAJ injection as needed.   ? glipiZIDE (GLUCOTROL) 10 MG tablet Take 1 tablet (10 mg total) by mouth daily before breakfast.  ? LORazepam (ATIVAN) 1 MG tablet TAKE 1/2 TO 1 TABLET BY MOUTH NIGHTLY AT BEDTIME  ? metFORMIN (GLUCOPHAGE) 850 MG tablet Take 1 tablet (850 mg total) by mouth every evening.  ? metoprolol tartrate (LOPRESSOR) 25 MG tablet TAKE 1 TABLET BY MOUTH TWICE A DAY  ? ONETOUCH ULTRA test strip USE  TO CHECK BLOOD SUGAR DAILY FOR TYPE 2 DIABETES DX: E11.9  ? pioglitazone (ACTOS) 30 MG tablet Take 1 tablet (30 mg total) by mouth daily.  ? simvastatin (ZOCOR) 20 MG tablet TAKE 1 TABLET BY MOUTH EVERY DAY  ? furosemide (LASIX) 40 MG tablet Take by mouth.  ? [DISCONTINUED] mometasone (ELOCON) 0.1 % cream Apply 1 application topically See admin instructions. Use 1 - 2 times daily as needed for itchy pink patches at shoulders  ? ?No facility-administered medications prior to visit.  ? ?Review of Systems ? ?  Objective  ?  ?BP (!) 182/83   Pulse 73   Temp (!) 96.8 ?F (36 ?C) (Temporal)   Resp 16   Wt 177 lb 8 oz (80.5 kg)   SpO2 98%   BMI 29.54 kg/m?  ? ?Physical Exam ?Vitals and nursing note reviewed.  ?Constitutional:   ?   General: He is not in acute distress. ?   Appearance: Normal appearance. He is obese. He is not ill-appearing, toxic-appearing or diaphoretic.  ?HENT:  ?   Head: Normocephalic and atraumatic.  ?Eyes:  ?   Pupils: Pupils are equal, round, and reactive to light.  ?Cardiovascular:  ?   Rate and Rhythm: Normal rate and regular rhythm.  ?   Pulses: Normal pulses.  ?   Heart sounds: Normal heart sounds.  ?Pulmonary:  ?   Effort: Pulmonary  effort is normal.  ?   Breath sounds: Normal breath sounds.  ?Musculoskeletal:     ?   General: Normal range of motion.  ?   Cervical back: Normal range of motion.  ?   Right lower leg: No edema.  ?   Left lower leg: No edema.  ?Skin: ?   General: Skin is dry.  ?   Capillary Refill: Capillary refill takes less than 2 seconds.  ? ?    ?   Comments: Coolness nearing ankles; shiny, hairless skin, visible veins on BLE  ?Neurological:  ?   General: No focal deficit present.  ?   Mental Status: He is alert and oriented to person, place, and time. Mental status is at baseline.  ?Psychiatric:     ?   Mood and Affect: Mood normal.     ?   Behavior: Behavior normal.     ?   Thought Content: Thought content normal.     ?   Judgment: Judgment normal.  ?  ?No results found  for any visits on 05/10/21. ? Assessment & Plan  ?  ? ?Problem List Items Addressed This Visit   ? ?  ? Cardiovascular and Mediastinum  ? Essential hypertension  ?  Chronic, elevated; recommend return to cardiology to advance medication to reach goal <130/<80 ?Denies CP ?Denies SOB/ DOE ?Denies low blood pressure/hypotension ?Denies vision changes ?No LE Edema noted on exam ?Continue medication, Metop 25 mg BID ?Seek emergent care if you develop chest pain or chest pressure ? ?  ?  ? Relevant Orders  ? Ambulatory referral to [facility] [place]  ?  ? Other  ? Colon cancer screening  ?  Due for repeat colon cancer screening ?  ?  ? Relevant Orders  ? Ambulatory referral to Gastroenterology  ? Fatigue due to excessive exertion - Primary  ?  Acute, worsening, unknown cause ?Check CMP, CBC, A1c, and TSH/Free T4 ?  ?  ? Relevant Orders  ? CBC with Differential/Platelet  ? TSH + free T4  ? Hemoglobin A1c  ? Comprehensive metabolic panel  ? Leg pain, bilateral  ?  Chronic, worsening ?Recommend follow up with vascular for ABI and dopplers ?Only able to walk 3 mins prior to stopping ?  ?  ? Relevant Orders  ? Ambulatory referral to Vascular Surgery  ? ?Return if symptoms worsen or fail to improve.  ?   ? ?I, Gwyneth Sprout, FNP, have reviewed all documentation for this visit. The documentation on 05/10/21 for the exam, diagnosis, procedures, and orders are all accurate and complete. ? ?Gwyneth Sprout, FNP  ?[facility] ?[phone] (phone) ?[phone] (fax) ? ?[facility] ?

## 2021-05-10 NOTE — Assessment & Plan Note (Signed)
Chronic, worsening ?Recommend follow up with vascular for ABI and dopplers ?Only able to walk 3 mins prior to stopping ?

## 2021-05-10 NOTE — Assessment & Plan Note (Signed)
Due for repeat colon cancer screening ?

## 2021-05-10 NOTE — Assessment & Plan Note (Signed)
Acute, worsening, unknown cause ?Check CMP, CBC, A1c, and TSH/Free T4 ?

## 2021-05-11 LAB — COMPREHENSIVE METABOLIC PANEL
ALT: 15 IU/L (ref 0–44)
AST: 19 IU/L (ref 0–40)
Albumin/Globulin Ratio: 1.5 (ref 1.2–2.2)
Albumin: 4.8 g/dL — ABNORMAL HIGH (ref 3.7–4.7)
Alkaline Phosphatase: 64 IU/L (ref 44–121)
BUN/Creatinine Ratio: 21 (ref 10–24)
BUN: 52 mg/dL — ABNORMAL HIGH (ref 8–27)
Bilirubin Total: 0.5 mg/dL (ref 0.0–1.2)
CO2: 21 mmol/L (ref 20–29)
Calcium: 10.7 mg/dL — ABNORMAL HIGH (ref 8.6–10.2)
Chloride: 100 mmol/L (ref 96–106)
Creatinine, Ser: 2.43 mg/dL — ABNORMAL HIGH (ref 0.76–1.27)
Globulin, Total: 3.2 g/dL (ref 1.5–4.5)
Glucose: 196 mg/dL — ABNORMAL HIGH (ref 70–99)
Potassium: 5.8 mmol/L — ABNORMAL HIGH (ref 3.5–5.2)
Sodium: 142 mmol/L (ref 134–144)
Total Protein: 8 g/dL (ref 6.0–8.5)
eGFR: 27 mL/min/{1.73_m2} — ABNORMAL LOW (ref >59)

## 2021-05-11 LAB — HEMOGLOBIN A1C
Est. average glucose Bld gHb Est-mCnc: 189 mg/dL
Hgb A1c MFr Bld: 8.2 % — ABNORMAL HIGH (ref 4.8–5.6)

## 2021-05-11 LAB — CBC WITH DIFFERENTIAL/PLATELET
Basophils Absolute: 0.1 10*3/uL (ref 0.0–0.2)
Basos: 1 %
EOS (ABSOLUTE): 0.3 10*3/uL (ref 0.0–0.4)
Eos: 3 %
Hematocrit: 38.7 % (ref 37.5–51.0)
Hemoglobin: 13.1 g/dL (ref 13.0–17.7)
Immature Grans (Abs): 0.1 10*3/uL (ref 0.0–0.1)
Immature Granulocytes: 1 %
Lymphocytes Absolute: 2.6 10*3/uL (ref 0.7–3.1)
Lymphs: 23 %
MCH: 30.9 pg (ref 26.6–33.0)
MCHC: 33.9 g/dL (ref 31.5–35.7)
MCV: 91 fL (ref 79–97)
Monocytes Absolute: 1.2 10*3/uL — ABNORMAL HIGH (ref 0.1–0.9)
Monocytes: 10 %
NRBC: 1 % — ABNORMAL HIGH (ref 0–0)
Neutrophils Absolute: 7.2 10*3/uL — ABNORMAL HIGH (ref 1.4–7.0)
Neutrophils: 62 %
Platelets: 311 10*3/uL (ref 150–450)
RBC: 4.24 x10E6/uL (ref 4.14–5.80)
RDW: 13.6 % (ref 11.6–15.4)
WBC: 11.5 10*3/uL — ABNORMAL HIGH (ref 3.4–10.8)

## 2021-05-11 LAB — TSH+FREE T4
Free T4: 1.31 ng/dL (ref 0.82–1.77)
TSH: 1.91 u[IU]/mL (ref 0.450–4.500)

## 2021-05-15 ENCOUNTER — Telehealth: Payer: Self-pay

## 2021-05-15 NOTE — Telephone Encounter (Signed)
CALLED PATIENT NO ANSWER LEFT VOICEMAIL FOR A CALL BACK LETTER SENT 

## 2021-05-17 ENCOUNTER — Other Ambulatory Visit: Payer: Self-pay

## 2021-05-17 DIAGNOSIS — Z86010 Personal history of colonic polyps: Secondary | ICD-10-CM

## 2021-05-17 MED ORDER — NA SULFATE-K SULFATE-MG SULF 17.5-3.13-1.6 GM/177ML PO SOLN: 1.0000 | Freq: Once | ORAL | 0 refills | Status: AC

## 2021-05-17 NOTE — Progress Notes (Signed)
Gastroenterology Pre-Procedure Review ? ?Request Date: Monday 09/24/21 ?Requesting Physician: Dr. Vicente Males ? ?PATIENT REVIEW QUESTIONS: The patient responded to the following health history questions as indicated:   ? ?1. Are you having any GI issues? no ?2. Do you have a personal history of Polyps? yes (last colonoscopy performed by Dr. Vira Agar 10/02/16 polyps noted) ?3. Do you have a family history of Colon Cancer or Polyps? no ?4. Diabetes Mellitus? yes (type 2) ?5. Joint replacements in the past 12 months?no ?6. Major health problems in the past 3 months?no ?7. Any artificial heart valves, MVP, or defibrillator?no ?   ?MEDICATIONS & ALLERGIES:    ?Patient reports the following regarding taking any anticoagulation/antiplatelet therapy:   ?Plavix, Coumadin, Eliquis, Xarelto, Lovenox, Pradaxa, Brilinta, or Effient? yes (Plavix prescribed by Dr. Roby Lofts thinner sent to Dr. Caryn Section) ?Aspirin? yes ('81mg'$  daily) ? ?Patient confirms/reports the following medications:  ?Current Outpatient Medications  ?Medication Sig Dispense Refill  ? aspirin EC 81 MG tablet Take 81 mg by mouth daily.    ? clopidogrel (PLAVIX) 75 MG tablet Take 1 tablet (75 mg total) by mouth daily. 90 tablet 4  ? EPINEPHrine 0.3 mg/0.3 mL IJ SOAJ injection as needed.     ? furosemide (LASIX) 40 MG tablet Take by mouth.    ? glipiZIDE (GLUCOTROL) 10 MG tablet Take 1 tablet (10 mg total) by mouth daily before breakfast. 90 tablet 3  ? LORazepam (ATIVAN) 1 MG tablet TAKE 1/2 TO 1 TABLET BY MOUTH NIGHTLY AT BEDTIME 90 tablet 3  ? metFORMIN (GLUCOPHAGE) 850 MG tablet Take 1 tablet (850 mg total) by mouth every evening. 90 tablet 1  ? metoprolol tartrate (LOPRESSOR) 25 MG tablet TAKE 1 TABLET BY MOUTH TWICE A DAY 180 tablet 1  ? ONETOUCH ULTRA test strip USE TO CHECK BLOOD SUGAR DAILY FOR TYPE 2 DIABETES DX: E11.9 100 strip 8  ? pioglitazone (ACTOS) 30 MG tablet Take 1 tablet (30 mg total) by mouth daily. 30 tablet 3  ? simvastatin (ZOCOR) 20 MG tablet TAKE 1  TABLET BY MOUTH EVERY DAY 90 tablet 3  ? ?No current facility-administered medications for this visit.  ? ? ?Patient confirms/reports the following allergies:  ?Allergies  ?Allergen Reactions  ? Hydrocodone-Guaifenesin Shortness Of Breath and Other (See Comments)  ?  Not able to sit still when using Codiclear DH  ? Bee Venom Other (See Comments)  ? Codeine   ?  Other reaction(s): Pruritus  ? Morphine Sulfate   ?  Other reaction(s): Pruritus  ? Warfarin Sodium Diarrhea  ? ? ?No orders of the defined types were placed in this encounter. ? ? ?AUTHORIZATION INFORMATION ?Primary Insurance: ?1D#: ?Group #: ? ?Secondary Insurance: ?1D#: ?Group #: ? ?SCHEDULE INFORMATION: ?Date: 09/24/21 ?Time: ?Location: [facility] ?

## 2021-06-07 ENCOUNTER — Other Ambulatory Visit: Payer: Self-pay | Admitting: Family Medicine

## 2021-06-07 DIAGNOSIS — E1121 Type 2 diabetes mellitus with diabetic nephropathy: Secondary | ICD-10-CM
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
## 2021-06-12 ENCOUNTER — Encounter: Payer: Self-pay | Admitting: Cardiovascular Disease

## 2021-06-12 ENCOUNTER — Ambulatory Visit: Payer: Medicare HMO | Admitting: Cardiovascular Disease

## 2021-06-12 VITALS — BP 158/70 | HR 53 | Ht 66.0 in | Wt 180.5 lb

## 2021-06-12 DIAGNOSIS — I10 Essential (primary) hypertension: Secondary | ICD-10-CM | POA: Diagnosis not present

## 2021-06-12 DIAGNOSIS — I4821 Permanent atrial fibrillation: Secondary | ICD-10-CM | POA: Diagnosis not present

## 2021-06-12 DIAGNOSIS — I6521 Occlusion and stenosis of right carotid artery: Secondary | ICD-10-CM | POA: Diagnosis not present

## 2021-06-12 DIAGNOSIS — E782 Mixed hyperlipidemia: Secondary | ICD-10-CM

## 2021-06-12 MED ORDER — METOPROLOL TARTRATE 25 MG PO TABS
12.5000 mg | ORAL_TABLET | Freq: Two times a day (BID) | ORAL | 1 refills | Status: DC
Start: 2021-06-12 — End: 2021-10-28

## 2021-06-12 MED ORDER — EZETIMIBE 10 MG PO TABS: 10.0000 mg | ORAL_TABLET | Freq: Every day | ORAL | 3 refills | Status: DC

## 2021-06-12 MED ORDER — APIXABAN 5 MG PO TABS: 5.0000 mg | ORAL_TABLET | Freq: Two times a day (BID) | ORAL | 3 refills | Status: DC

## 2021-06-12 NOTE — Patient Instructions (Addendum)
Please check BP at home, take to your PCP later this month ? ?Medication Instructions:  ?Please start eliquis 5 mg twice a day, for atrial fibrillation ?Stop aspirin and plavix ? ?Please start zetia 10 mg daily ? ?Please cut the metoprolol in 1/2 twice a day, heart rate is slow ? ?If you need a refill on your cardiac medications before your next appointment, please call your pharmacy.  ? ?Lab work: ?No new labs needed ? ?Testing/Procedures: ? ?Your physician has requested that you have an abdominal aorta duplex. During this test, an ultrasound is used to evaluate the aorta. Allow 30 minutes for this exam. Do not eat after midnight the day before and avoid carbonated beverages  ? ?Your physician has requested that you have a lower or upper extremity arterial duplex. This test is an ultrasound of the arteries in the legs or arms. It looks at arterial blood flow in the legs and arms. Allow one hour for Lower and Upper Arterial scans. There are no restrictions or special instructions  ? ?Follow-Up: ?At [facility], you and your health needs are our priority.  As part of our continuing mission to provide you with exceptional heart care, we have created designated Provider Care Teams.  These Care Teams include your primary Cardiologist (physician) and Advanced Practice Providers (APPs -  Physician Assistants and Nurse Practitioners) who all work together to provide you with the care you need, when you need it. ? ?You will need a follow up appointment in 6 months ? ?Providers on your designated Care Team:   ?Murray Hodgkins, NP ?Christell Faith, PA-C ?Cadence Kathlen Mody, PA-C ? ?COVID-19 Vaccine Information can be found at: ShippingScam.co.uk For questions related to vaccine distribution or appointments, please email [email or web address] or call [phone].  ? ?

## 2021-06-13 ENCOUNTER — Other Ambulatory Visit: Payer: Self-pay | Admitting: Family Medicine

## 2021-06-23 ENCOUNTER — Other Ambulatory Visit: Payer: Self-pay | Admitting: Family Medicine

## 2021-06-25 ENCOUNTER — Encounter: Payer: Self-pay | Admitting: Cardiovascular Disease

## 2021-06-25 NOTE — Telephone Encounter (Signed)
Requested medications are due for refill today.  no  Requested medications are on the active medications list.  no  Last refill. 05/30/2020 #90 4 refills  Future visit scheduled.   yes  Notes to clinic.  Medication was discontinued 06/12/2021.     Requested Prescriptions  Pending Prescriptions Disp Refills   clopidogrel (PLAVIX) 75 MG tablet [Pharmacy Med Name: CLOPIDOGREL 75 MG TABLET] 90 tablet 4    Sig: TAKE 1 TABLET BY MOUTH EVERY DAY     Hematology: Antiplatelets - clopidogrel Failed - 06/23/2021  9:13 AM      Failed - Cr in normal range and within 360 days    Creatinine  Date Value Ref Range Status  05/20/2012 1.24 0.60 - 1.30 mg/dL Final   Creatinine, Ser  Date Value Ref Range Status  05/10/2021 2.43 (H) 0.76 - 1.27 mg/dL Final   Creatinine, POC  Date Value Ref Range Status  11/02/2018 n/a mg/dL Final         Passed - HCT in normal range and within 180 days    Hematocrit  Date Value Ref Range Status  05/10/2021 38.7 37.5 - 51.0 % Final         Passed - HGB in normal range and within 180 days    Hemoglobin  Date Value Ref Range Status  05/10/2021 13.1 13.0 - 17.7 g/dL Final         Passed - PLT in normal range and within 180 days    Platelets  Date Value Ref Range Status  05/10/2021 311 150 - 450 x10E3/uL Final         Passed - Valid encounter within last 6 months    Recent Outpatient Visits           1 month ago Fatigue due to excessive exertion, initial encounter   [facility] Tally Joe T, FNP   3 months 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[facility])   [facility] Birdie Sons, MD   11 months ago Annual physical exam   [facility] Birdie Sons, MD   1 year 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[facility])   [facility] Birdie Sons, MD   1 year ago Annual physical exam   [facility] Birdie Sons, MD       Future Appointments             In 2 days Fisher,  Kirstie Peri, MD [facility], [place]   In 1 month Ralene Bathe, MD [place]   In 5 months [place], Kathlene November, MD [facility], [place]

## 2021-06-26 NOTE — Progress Notes (Unsigned)
I,Roshena L Chambers,acting as a scribe for Lelon Huh, MD.,have documented all relevant documentation on the behalf of Lelon Huh, MD,as directed by  Lelon Huh, MD while in the presence of Lelon Huh, MD.    Established patient visit   [name]   DOB: 12-Dec-1940   81 y.o. Male  MRN: [id number] Visit Date: 06/27/2021  Today's healthcare provider: Lelon Huh, MD   Chief Complaint  Patient presents with   Hypertension   Diabetes   Subjective    HPI  Hypertension, follow-up  BP Readings from Last 3 Encounters:  06/27/21 (!) 161/82  06/12/21 (!) 158/70  05/10/21 (!) 182/83   Wt Readings from Last 3 Encounters:  06/27/21 176 lb (79.8 kg)  06/12/21 180 lb 8 oz (81.9 kg)  05/10/21 177 lb 8 oz (80.5 kg)     He was last seen for hypertension on 05/10/2021 (seen by Tally Joe, FNP).  BP at that visit was 182/83. From that visit: recommend return to cardiology to advance medication to reach goal <130/<80.  He reports good compliance with treatment. Since last visit, patient has been seen by Cardiology and clopidogrel was changed to Eliquis which he is tolerating well, but is more expensive.  He is not having side effects.  He is following a Regular diet. He is exercising. He does not smoke.  Use of agents associated with hypertension: none.   Outside blood pressures are 130/70. Symptoms: No chest pain No chest pressure  No palpitations No syncope  No dyspnea No orthopnea  No paroxysmal nocturnal dyspnea No lower extremity edema   Pertinent labs Lab Results  Component Value Date   CHOL 153 07/14/2020   HDL 47 07/14/2020   LDLCALC 75 07/14/2020   TRIG 183 (H) 07/14/2020   CHOLHDL 3.3 07/14/2020   Lab Results  Component Value Date   NA 142 05/10/2021   K 5.8 (H) 05/10/2021   CREATININE 2.43 (H) 05/10/2021   EGFR 27 (L) 05/10/2021   GLUCOSE 196 (H) 05/10/2021   TSH 1.910 05/10/2021     The 10-year ASCVD risk score (Arnett DK,  et al., 2019) is: 67.9%  ---------------------------------------------------------------------------------------------------   Diabetes Mellitus Type II, Follow-up  Lab Results  Component Value Date   HGBA1C 8.2 (H) 05/10/2021   HGBA1C 7.4 (A) 02/26/2021   HGBA1C 7.6 (H) 07/14/2020   Wt Readings from Last 3 Encounters:  06/27/21 176 lb (79.8 kg)  06/12/21 180 lb 8 oz (81.9 kg)  05/10/21 177 lb 8 oz (80.5 kg)   Last seen for diabetes 3 months ago.  From that visit: Due to progressive CKD reduced- metFORMIN (GLUCOPHAGE) 850 MG tablet; to take 1 tablet (850 mg total) by mouth every evening. Will likely need to discontinue unless kidney functions improve at follow up.    Increased glipiZIDE (GLUCOTROL) to 10 MG tablet; Take 1 tablet (10 mg total) by mouth daily before breakfast. He reports good compliance with treatment. HgbA1C was checked by Tally Joe 1 month ago. He is not having side effects.  Symptoms: No fatigue No foot ulcerations  No appetite changes No nausea  No paresthesia of the feet  No polydipsia  No polyuria No visual disturbances   No vomiting     Home blood sugar records: fasting range: 130's  Episodes of hypoglycemia? No    Current insulin regiment: none Most Recent Eye Exam: 10/05/2020 Current exercise: walking and yard work Current diet habits: well balanced  Pertinent Labs: Lab Results  Component Value Date   CHOL 153 07/14/2020   HDL 47 07/14/2020   LDLCALC 75 07/14/2020   TRIG 183 (H) 07/14/2020   CHOLHDL 3.3 07/14/2020   Lab Results  Component Value Date   NA 142 05/10/2021   K 5.8 (H) 05/10/2021   CREATININE 2.43 (H) 05/10/2021   EGFR 27 (L) 05/10/2021   MICROALBUR 50 11/02/2018     ---------------------------------------------------------------------------------------------------   Medications: Outpatient Medications Prior to Visit  Medication Sig   apixaban (ELIQUIS) 5 MG TABS tablet Take 1 tablet (5 mg total) by mouth 2 (two)  times daily.   EPINEPHrine 0.3 mg/0.3 mL IJ SOAJ injection as needed.   ezetimibe (ZETIA) 10 MG tablet Take 1 tablet (10 mg total) by mouth daily.   glipiZIDE (GLUCOTROL) 10 MG tablet Take 1 tablet (10 mg total) by mouth daily before breakfast.   LORazepam (ATIVAN) 1 MG tablet TAKE 1/2 TO 1 TABLET BY MOUTH NIGHTLY AT BEDTIME   metFORMIN (GLUCOPHAGE) 850 MG tablet Take 1 tablet (850 mg total) by mouth every evening.   metoprolol tartrate (LOPRESSOR) 25 MG tablet Take 0.5 tablets (12.5 mg total) by mouth 2 (two) times daily.   ONETOUCH ULTRA test strip USE TO CHECK BLOOD SUGAR DAILY FOR TYPE 2 DIABETES DX: E11.9   pioglitazone (ACTOS) 30 MG tablet TAKE 1 TABLET BY MOUTH EVERY DAY   simvastatin (ZOCOR) 20 MG tablet TAKE 1 TABLET BY MOUTH EVERY DAY   furosemide (LASIX) 40 MG tablet Take 40 mg by mouth daily.   No facility-administered medications prior to visit.        Objective    BP (!) 161/82 (BP Location: Right Arm, Patient Position: Sitting, Cuff Size: Normal)   Pulse (!) 54   Temp 97.6 F (36.4 C) (Oral)   Resp 16   Wt 176 lb (79.8 kg)   SpO2 100% Comment: room air  BMI 28.41 kg/m   Today's Vitals   06/27/21 0815 06/27/21 0820  BP: (!) 171/70 (!) 161/82  Pulse: (!) 54   Resp: 16   Temp: 97.6 F (36.4 C)   TempSrc: Oral   SpO2: 100%   Weight: 176 lb (79.8 kg)    Body mass index is 28.41 kg/m.   Physical Exam   General: Appearance:     Overweight male in no acute distress  Eyes:    PERRL, conjunctiva/corneas clear, EOM's intact       Lungs:     Clear to auscultation bilaterally, respirations unlabored  Heart:    Bradycardic. Irregularly irregular rhythm. No murmurs, rubs, or gallops.    MS:   All extremities are intact.    Neurologic:   Awake, alert, oriented x 3. No apparent focal neurological defect.         Assessment & Plan     1. 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(HCC) A1c up last month and home sugars a little elevated since cutting back on dose of  metformin due to CKD. He was briefly prescribed Farxiga by Dr. Juleen China for CKD last year without any adverse effects, but apparently stopped since kidney functions did not improved. Discussed a trial of this for diabetes. He's going to work on improving diet and being more active, but willing to try [place] again if A1c is not improved at follow up in August.   2. Chronic kidney disease (CKD), active medical management without dialysis, stage 4 (severe) (HCC) Stable, continue follow up with Dr. Juleen China next month as scheduled.   3. Essential hypertension  Home Bps much better than office readings. Continue current medications.    4. Intermittent atrial fibrillation (HCC) Asymptomatic. Compliant with medication.  Continue aggressive risk factor modification.  Counseled on the importance of preventing thrombosis to reduced CVA risk. Given 4 weeks samples of Eliquis. He tried Xarelto several years ago which he did not tolerate.       The entirety of the information documented in the History of Present Illness, Review of Systems and Physical Exam were personally obtained by me. Portions of this information were initially documented by the CMA and reviewed by me for thoroughness and accuracy.     Lelon Huh, MD  [facility] [phone] (phone) [phone] (fax)  [place]

## 2021-06-27 ENCOUNTER — Encounter: Payer: Self-pay | Admitting: Family Medicine

## 2021-06-27 ENCOUNTER — Ambulatory Visit (INDEPENDENT_AMBULATORY_CARE_PROVIDER_SITE_OTHER): Payer: Medicare HMO | Admitting: Family Medicine

## 2021-06-27 VITALS — BP 161/82 | HR 54 | Temp 97.6°F | Resp 16 | Wt 176.0 lb

## 2021-06-27 DIAGNOSIS — I480 Paroxysmal atrial fibrillation: Secondary | ICD-10-CM

## 2021-06-27 DIAGNOSIS — N184 Chronic kidney disease, stage 4 (severe): Secondary | ICD-10-CM | POA: Diagnosis not present

## 2021-06-27 DIAGNOSIS — I10 Essential (primary) hypertension: Secondary | ICD-10-CM | POA: Diagnosis not present

## 2021-06-27 DIAGNOSIS — E1121 Type 2 diabetes mellitus with diabetic nephropathy: Secondary | ICD-10-CM | POA: Diagnosis not present

## 2021-06-28 ENCOUNTER — Telehealth: Payer: Self-pay | Admitting: Gastroenterology

## 2021-06-28 ENCOUNTER — Encounter: Payer: Self-pay | Admitting: Family Medicine

## 2021-06-28 ENCOUNTER — Telehealth: Payer: Self-pay

## 2021-06-28 MED ORDER — EPINEPHRINE 0.3 MG/0.3ML IJ SOAJ: 0.3000 mg | INTRAMUSCULAR | 1 refills | Status: DC | PRN

## 2021-06-28 NOTE — Telephone Encounter (Signed)
Patient called to cancel appointment and will call back later to reschedule at a later time called endo and canceled

## 2021-06-28 NOTE — Telephone Encounter (Signed)
PT left message to cancel procedure for 08/21 will call back at a later date

## 2021-06-28 NOTE — Telephone Encounter (Signed)
Patient's wife is requesting a refill on Epi pen. This medication is listed as historical. Please advise on refill request.

## 2021-07-10 ENCOUNTER — Other Ambulatory Visit: Payer: Self-pay | Admitting: Cardiovascular Disease

## 2021-07-10 DIAGNOSIS — I739 Peripheral vascular disease, unspecified: Secondary | ICD-10-CM

## 2021-07-18 ENCOUNTER — Ambulatory Visit (INDEPENDENT_AMBULATORY_CARE_PROVIDER_SITE_OTHER): Payer: Medicare HMO

## 2021-07-18 DIAGNOSIS — I739 Peripheral vascular disease, unspecified: Secondary | ICD-10-CM

## 2021-07-23 DIAGNOSIS — N2581 Secondary hyperparathyroidism of renal origin: Secondary | ICD-10-CM | POA: Diagnosis not present

## 2021-07-23 DIAGNOSIS — N1832 Chronic kidney disease, stage 3b: Secondary | ICD-10-CM | POA: Diagnosis not present

## 2021-07-23 DIAGNOSIS — E875 Hyperkalemia: Secondary | ICD-10-CM | POA: Diagnosis not present

## 2021-07-23 DIAGNOSIS — I129 Hypertensive chronic kidney disease with stage 1 through stage 4 chronic kidney disease, or unspecified chronic kidney disease: Secondary | ICD-10-CM | POA: Diagnosis not present

## 2021-07-23 DIAGNOSIS — E1122 Type 2 diabetes mellitus with diabetic chronic kidney disease: Secondary | ICD-10-CM | POA: Diagnosis not present

## 2021-07-23 DIAGNOSIS — R809 Proteinuria, unspecified: Secondary | ICD-10-CM | POA: Diagnosis not present

## 2021-07-30 DIAGNOSIS — R809 Proteinuria, unspecified: Secondary | ICD-10-CM | POA: Diagnosis not present

## 2021-07-30 DIAGNOSIS — N1832 Chronic kidney disease, stage 3b: Secondary | ICD-10-CM | POA: Diagnosis not present

## 2021-07-30 DIAGNOSIS — I10 Essential (primary) hypertension: Secondary | ICD-10-CM | POA: Diagnosis not present

## 2021-07-30 DIAGNOSIS — E1122 Type 2 diabetes mellitus with diabetic chronic kidney disease: Secondary | ICD-10-CM | POA: Diagnosis not present

## 2021-07-30 DIAGNOSIS — N2581 Secondary hyperparathyroidism of renal origin: Secondary | ICD-10-CM | POA: Diagnosis not present

## 2021-08-04 ENCOUNTER — Other Ambulatory Visit: Payer: Self-pay | Admitting: Family Medicine

## 2021-08-04 DIAGNOSIS — I779 Disorder of arteries and arterioles, unspecified: Secondary | ICD-10-CM

## 2021-08-04 DIAGNOSIS — E1121 Type 2 diabetes mellitus with diabetic nephropathy: Secondary | ICD-10-CM

## 2021-08-06 NOTE — Telephone Encounter (Signed)
Requested medication (s) are due for refill today - yes/no  Requested medication (s) are on the active medication list -yes/no  Future visit scheduled -yes  Last refill: simvastatin 08/29/20 #90 3RF- fails lab protocol- over 1 year                 Clopidogrel - no longer on current medication list  Notes to clinic: see above  Requested Prescriptions  Pending Prescriptions Disp Refills   simvastatin (ZOCOR) 20 MG tablet [Pharmacy Med Name: SIMVASTATIN 20 MG TABLET] 90 tablet 3    Sig: TAKE 1 TABLET BY MOUTH EVERY DAY     Cardiovascular:  Antilipid - Statins Failed - 08/04/2021  7:44 PM      Failed - Lipid Panel in normal range within the last 12 months    Cholesterol, Total  Date Value Ref Range Status  07/14/2020 153 100 - 199 mg/dL Final   Cholesterol  Date Value Ref Range Status  05/20/2012 131 0 - 200 mg/dL Final   Ldl Cholesterol, Calc  Date Value Ref Range Status  05/20/2012 56 0 - 100 mg/dL Final   LDL Chol Calc (NIH)  Date Value Ref Range Status  07/14/2020 75 0 - 99 mg/dL Final   HDL Cholesterol  Date Value Ref Range Status  05/20/2012 44 40 - 60 mg/dL Final   HDL  Date Value Ref Range Status  07/14/2020 47 >39 mg/dL Final   Triglycerides  Date Value Ref Range Status  07/14/2020 183 (H) 0 - 149 mg/dL Final  05/20/2012 154 0 - 200 mg/dL Final         Passed - Patient is not pregnant      Passed - Valid encounter within last 12 months    Recent Outpatient Visits           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[place])   [facility] Birdie Sons, MD   2 months ago Fatigue due to excessive exertion, initial encounter   [facility] Tally Joe T, FNP   5 months 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[facility])   [facility] Birdie Sons, MD   1 year ago Annual physical exam   [facility] Birdie Sons, MD   1 year 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[facility])   [facility] Birdie Sons, MD       Future Appointments             In 2 weeks Ralene Bathe, MD [place]   In 1 month Fisher, Kirstie Peri, MD [facility], [facility]   In 4 months Gollan, Kathlene November, MD [place], [facility]             clopidogrel (PLAVIX) 75 MG tablet [Pharmacy Med Name: CLOPIDOGREL 75 MG TABLET] 90 tablet 4    Sig: TAKE 1 TABLET BY MOUTH [place] DAY     Hematology: Antiplatelets - clopidogrel Failed - 08/04/2021  7:44 PM      Failed - Cr in normal range and within 360 days    Creatinine  Date Value Ref Range Status  05/20/2012 1.24 0.60 - 1.30 mg/dL Final   Creatinine, Ser  Date Value Ref Range Status  05/10/2021 2.43 (H) 0.76 - 1.27 mg/dL Final   Creatinine, POC  Date Value Ref Range Status  11/02/2018 n/a mg/dL Final         Passed - HCT in normal range and within 180  days    Hematocrit  Date Value Ref Range Status  05/10/2021 38.7 37.5 - 51.0 % Final         Passed - HGB in normal range and within 180 days    Hemoglobin  Date Value Ref Range Status  05/10/2021 13.1 13.0 - 17.7 g/dL Final         Passed - PLT in normal range and within 180 days    Platelets  Date Value Ref Range Status  05/10/2021 311 150 - 450 x10E3/uL Final         Passed - Valid encounter within last 6 months    Recent Outpatient Visits           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[facility])   [facility] Birdie Sons, MD   2 months ago Fatigue due to excessive exertion, initial encounter   [facility] Tally Joe T, FNP   5 months 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[facility])   [facility] Birdie Sons, MD   1 year ago Annual physical exam   [facility] Birdie Sons, MD   1 year 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[facility])   [facility] Birdie Sons, MD       Future Appointments             In 2 weeks Ralene Bathe,  MD [place]   In 1 month Fisher, Kirstie Peri, MD [facility], [facility]   In 4 months Gollan, Kathlene November, MD [place], [facility]            Refused Prescriptions Disp Refills   metFORMIN (GLUCOPHAGE) 850 MG tablet [Pharmacy Med Name: METFORMIN HCL 850 MG TABLET] 90 tablet 1    Sig: TAKE 1 TABLET (850 MG TOTAL) BY MOUTH EVERY EVENING.     Endocrinology:  Diabetes - Biguanides Failed - 08/04/2021  7:44 PM      Failed - Cr in normal range and within 360 days    Creatinine  Date Value Ref Range Status  05/20/2012 1.24 0.60 - 1.30 mg/dL Final   Creatinine, Ser  Date Value Ref Range Status  05/10/2021 2.43 (H) 0.76 - 1.27 mg/dL Final   Creatinine, POC  Date Value Ref Range Status  11/02/2018 n/a mg/dL Final         Failed - HBA1C is between 0 and 7.9 and within 180 days    Hgb A1c MFr Bld  Date Value Ref Range Status  05/10/2021 8.2 (H) 4.8 - 5.6 % Final    Comment:             Prediabetes: 5.7 - 6.4          Diabetes: >6.4          Glycemic control for adults with diabetes: <7.0          Failed - eGFR in normal range and within 360 days    EGFR (African American)  Date Value Ref Range Status  05/20/2012 >60  Final   GFR calc Af Amer  Date Value Ref Range Status  07/09/2019 36 (L) >59 mL/min/1.73 Final    Comment:    **[facility] currently reports eGFR in compliance with the current**   recommendations of the [employer]. [facility] will   update reporting as new guidelines are published from the NKF-ASN   Task force.    EGFR (Non-African Amer.)  Date Value Ref Range Status  05/20/2012 59 (L)  Final    Comment:    eGFR values <38mL/min/1.73 m2 may be an indication of chronic kidney disease (CKD). Calculated eGFR is useful in patients with stable renal function. The eGFR calculation will not be reliable in acutely ill patients when serum creatinine is changing rapidly. It is not useful in  patients on dialysis. The  eGFR calculation may not be applicable to patients at the low and high extremes of body sizes, pregnant women, and vegetarians.    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[facility])   [facility]va Limes, MD   2 months ago Fatigue due to excessive exertion, initial encounter   [facility] Merita Norton T, FNP   5 months 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[facility])   [facility] Malva Limes, MD   1 year ago Annual physical exam   [facility] Malva Limes, MD   1 year 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[facility])   [facility] Malva Limes, MD       Future Appointments             In 2 weeks Deirdre Evener, MD [facility]   In 1 month Fisher, Demetrios Isaacs, MD [facility], [facility]   In 4 months Gollan, Tollie Pizza, MD [facility], [facility]            Passed - CBC within normal limits and completed in the last 12 months    WBC  Date Value Ref Range Status  05/10/2021 11.5 (H) 3.4 - 10.8 x10E3/uL Final  12/30/2018 10.5 4.0 - 10.5 K/uL Final   RBC  Date Value Ref Range Status  05/10/2021 4.24 4.14 - 5.80 x10E6/uL Final  12/30/2018 4.25 4.22 - 5.81 MIL/uL Final   Hemoglobin  Date Value Ref Range Status  05/10/2021 13.1 13.0 - 17.7 g/dL Final   Hematocrit  Date Value Ref Range Status  05/10/2021 38.7 37.5 - 51.0 % Final   MCHC  Date Value Ref Range Status  05/10/2021 33.9 31.5 - 35.7 g/dL Final  57/75/3454 56.4 30.0 - 36.0 g/dL Final   [facility]  Date Value Ref Range Status   05/10/2021 30.9 26.6 - 33.0 pg Final  12/30/2018 29.9 26.0 - 34.0 pg Final   MCV  Date Value Ref Range Status  05/10/2021 91 79 - 97 fL Final  08/07/2011 91 80 - 100 fL Final   No results found for: "PLTCOUNTKUC", "LABPLAT", "POCPLA" RDW  Date Value Ref Range Status  05/10/2021 13.6 11.6 - 15.4 % Final  08/07/2011 14.8 (H) 11.5 - 14.5 % Final          metoprolol tartrate (LOPRESSOR) 25 MG tablet [Pharmacy Med Name: METOPROLOL TARTRATE 25 MG TAB] 180 tablet 1    Sig: TAKE 1 TABLET BY MOUTH TWICE A DAY     Cardiovascular:  Beta Blockers Failed - 08/04/2021  7:44 PM  Failed - Last BP in normal range    BP Readings from Last 1 Encounters:  06/27/21 (!) 161/82         Passed - Last Heart Rate in normal range    Pulse Readings from Last 1 Encounters:  06/27/21 (!) 35         Passed - Valid encounter within last 6 months    Recent Outpatient Visits           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[place])   [facility], MD   2 months ago Fatigue due to excessive exertion, initial encounter   [facility] Tally Joe T, FNP   5 months 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[facility])   [facility] Birdie Sons, MD   1 year ago Annual physical exam   [facility] Birdie Sons, MD   1 year 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[facility])   [facility] Birdie Sons, MD       Future Appointments             In 2 weeks Ralene Bathe, MD [place]   In 1 month [place], MD [facility], [facility]   In 4 months Gollan, Kathlene November, MD [facility], [facility]               Requested Prescriptions  Pending Prescriptions Disp Refills   simvastatin (ZOCOR) 20 MG tablet [Pharmacy Med Name: SIMVASTATIN 20 MG TABLET] 90 tablet 3    Sig: TAKE 1 TABLET BY MOUTH EVERY DAY     Cardiovascular:  Antilipid - Statins Failed -  08/04/2021  7:44 PM      Failed - Lipid Panel in normal range within the last 12 months    Cholesterol, Total  Date Value Ref Range Status  07/14/2020 153 100 - 199 mg/dL Final   Cholesterol  Date Value Ref Range Status  05/20/2012 131 0 - 200 mg/dL Final   Ldl Cholesterol, Calc  Date Value Ref Range Status  05/20/2012 56 0 - 100 mg/dL Final   LDL Chol Calc (NIH)  Date Value Ref Range Status  07/14/2020 75 0 - 99 mg/dL Final   HDL Cholesterol  Date Value Ref Range Status  05/20/2012 44 40 - 60 mg/dL Final   HDL  Date Value Ref Range Status  07/14/2020 47 >39 mg/dL Final   Triglycerides  Date Value Ref Range Status  07/14/2020 183 (H) 0 - 149 mg/dL Final  05/20/2012 154 0 - 200 mg/dL Final         Passed - Patient is not pregnant      Passed - Valid encounter within last 12 months    Recent Outpatient Visits           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[facility])   [facility], MD   2 months ago Fatigue due to excessive exertion, initial encounter   [facility] Tally Joe T, FNP   5 months 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[facility])   [facility] Birdie Sons, MD   1 year ago Annual physical exam   [facility] Birdie Sons, MD   1 year 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[facility])   [facility] Birdie Sons, MD       Future Appointments  In 2 weeks Ralene Bathe, MD [place]   In 1 month Fisher, Kirstie Peri, MD [facility], [facility]   In 4 months Gollan, Kathlene November, MD [facility], [facility]             clopidogrel (PLAVIX) 75 MG tablet [Pharmacy Med Name: CLOPIDOGREL 75 MG TABLET] 90 tablet 4    Sig: TAKE 1 TABLET BY [place] DAY     Hematology: Antiplatelets - clopidogrel Failed - 08/04/2021  7:44 PM      Failed - Cr in normal range and within 360 days    Creatinine  Date Value Ref  Range Status  05/20/2012 1.24 0.60 - 1.30 mg/dL Final   Creatinine, Ser  Date Value Ref Range Status  05/10/2021 2.43 (H) 0.76 - 1.27 mg/dL Final   Creatinine, POC  Date Value Ref Range Status  11/02/2018 n/a mg/dL Final         Passed - HCT in normal range and within 180 days    Hematocrit  Date Value Ref Range Status  05/10/2021 38.7 37.5 - 51.0 % Final         Passed - HGB in normal range and within 180 days    Hemoglobin  Date Value Ref Range Status  05/10/2021 13.1 13.0 - 17.7 g/dL Final         Passed - PLT in normal range and within 180 days    Platelets  Date Value Ref Range Status  05/10/2021 311 150 - 450 x10E3/uL Final         Passed - Valid encounter within last 6 months    Recent Outpatient Visits           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[facility])   [facility] Birdie Sons, MD   2 months ago Fatigue due to excessive exertion, initial encounter   [facility] Tally Joe T, FNP   5 months 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[facility])   [facility] Birdie Sons, MD   1 year ago Annual physical exam   [facility] Birdie Sons, MD   1 year 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[facility])   [facility] Birdie Sons, MD       Future Appointments             In 2 weeks Ralene Bathe, MD [place]   In 1 month Fisher, Kirstie Peri, MD [facility], [facility]   In 4 months Gollan, Kathlene November, MD [place], [facility]            Refused Prescriptions Disp Refills   metFORMIN (GLUCOPHAGE) 850 MG tablet [Pharmacy Med Name: METFORMIN HCL 850 MG TABLET] 90 tablet 1    Sig: TAKE 1 TABLET (850 MG TOTAL) BY MOUTH EVERY EVENING.     Endocrinology:  Diabetes - Biguanides Failed - 08/04/2021  7:44 PM      Failed - Cr in normal range and within 360 days    Creatinine  Date Value Ref Range Status  05/20/2012 1.24 0.60 - 1.30  mg/dL Final   Creatinine, Ser  Date Value Ref Range Status  05/10/2021 2.43 (H) 0.76 - 1.27 mg/dL Final   Creatinine, POC  Date Value Ref Range Status  11/02/2018 n/a mg/dL Final         Failed - HBA1C is between 0 and 7.9 and within 180 days    Hgb A1c  MFr Bld  Date Value Ref Range Status  05/10/2021 8.2 (H) 4.8 - 5.6 % Final    Comment:             Prediabetes: 5.7 - 6.4          Diabetes: >6.4          Glycemic control for adults with diabetes: <7.0          Failed - eGFR in normal range and within 360 days    EGFR (African American)  Date Value Ref Range Status  05/20/2012 >60  Final   GFR calc Af Amer  Date Value Ref Range Status  07/09/2019 36 (L) >59 mL/min/1.73 Final    Comment:    **[facility] currently reports eGFR in compliance with the current**   recommendations of the [employer]. [facility] will   update reporting as new guidelines are published from the NKF-ASN   Task force.    EGFR (Non-African Amer.)  Date Value Ref Range Status  05/20/2012 59 (L)  Final    Comment:    eGFR values <62mL/min/1.73 m2 may be an indication of chronic kidney disease (CKD). Calculated eGFR is useful in patients with stable renal function. The eGFR calculation will not be reliable in acutely ill patients when serum creatinine is changing rapidly. It is not useful in  patients on dialysis. The eGFR calculation may not be applicable to patients at the low and high extremes of body sizes, pregnant women, and vegetarians.    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[facility])   [facility] Birdie Sons, MD   2 months ago Fatigue due to excessive exertion, initial encounter   [facility] Tally Joe T, FNP   5 months 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[facility])   [facility] Birdie Sons, MD   1 year ago Annual physical exam   [facility] Birdie Sons, MD   1 year 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[facility])   [facility] Birdie Sons, MD       Future Appointments             In 2 weeks Ralene Bathe, MD [place]   In 1 month [place], MD [facility], [facility]   In 4 months Gollan, Kathlene November, MD [place], [facility]            Passed - CBC within normal limits and completed in the last 12 months    WBC  Date Value Ref Range Status  05/10/2021 11.5 (H) 3.4 - 10.8 x10E3/uL Final  12/30/2018 10.5 4.0 - 10.5 K/uL Final   RBC  Date Value Ref Range Status  05/10/2021 4.24 4.14 - 5.80 x10E6/uL Final  12/30/2018 4.25 4.22 - 5.81 MIL/uL Final   Hemoglobin  Date Value Ref Range Status  05/10/2021 13.1 13.0 - 17.7 g/dL Final   Hematocrit  Date Value  Ref Range Status  05/10/2021 38.7 37.5 - 51.0 % Final   MCHC  Date Value Ref Range Status  05/10/2021 33.9 31.5 - 35.7 g/dL Final  12/30/2018 31.9 30.0 - 36.0 g/dL Final   [facility]  Date Value Ref Range Status  05/10/2021 30.9 26.6 - 33.0 pg Final  12/30/2018 29.9 26.0 - 34.0 pg Final   MCV  Date Value Ref Range Status  05/10/2021 91 79 - 97 fL Final  08/07/2011 91 80 - 100 fL Final   No results found for: "PLTCOUNTKUC", "LABPLAT", "POCPLA" RDW  Date Value Ref Range Status  05/10/2021 13.6 11.6 - 15.4 % Final  08/07/2011 14.8 (H) 11.5 - 14.5 % Final          metoprolol tartrate (LOPRESSOR) 25 MG tablet [Pharmacy Med Name: METOPROLOL TARTRATE 25 MG TAB] 180 tablet 1    Sig: TAKE 1 TABLET BY MOUTH TWICE A DAY     Cardiovascular:  Beta Blockers Failed - 08/04/2021  7:44 PM       Failed - Last BP in normal range    BP Readings from Last 1 Encounters:  06/27/21 (!) 161/82         Passed - Last Heart Rate in normal range    Pulse Readings from Last 1 Encounters:  06/27/21 (!) 54         Passed - Valid encounter within last 6 months    Recent Outpatient Visits           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[place])   [place], Donald E, MD   2 months ago Fatigue due to excessive exertion, initial encounter   [facility] Tally Joe T, FNP   5 months 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[facility])   [facility] Birdie Sons, MD   1 year ago Annual physical exam   [facility] Birdie Sons, MD   1 year 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[facility])   [facility] Birdie Sons, MD       Future Appointments             In 2 weeks Ralene Bathe, MD [place]   In 1 month Fisher, Kirstie Peri, MD [facility], [place]   In 4 months Gollan, Kathlene November, MD [facility], [place]

## 2021-08-06 NOTE — Telephone Encounter (Signed)
Metformin 03/23/21 #90 1RF- too soon Metoprolol 06/12/21 #180 1RF- too soon Requested Prescriptions  Pending Prescriptions Disp Refills  . metFORMIN (GLUCOPHAGE) 850 MG tablet [Pharmacy Med Name: METFORMIN HCL 850 MG TABLET] 90 tablet 1    Sig: TAKE 1 TABLET (850 MG TOTAL) BY MOUTH EVERY EVENING.     Endocrinology:  Diabetes - Biguanides Failed - 08/04/2021  7:44 PM      Failed - Cr in normal range and within 360 days    Creatinine  Date Value Ref Range Status  05/20/2012 1.24 0.60 - 1.30 mg/dL Final   Creatinine, Ser  Date Value Ref Range Status  05/10/2021 2.43 (H) 0.76 - 1.27 mg/dL Final   Creatinine, POC  Date Value Ref Range Status  11/02/2018 n/a mg/dL Final         Failed - HBA1C is between 0 and 7.9 and within 180 days    Hgb A1c MFr Bld  Date Value Ref Range Status  05/10/2021 8.2 (H) 4.8 - 5.6 % Final    Comment:             Prediabetes: 5.7 - 6.4          Diabetes: >6.4          Glycemic control for adults with diabetes: <7.0          Failed - eGFR in normal range and within 360 days    EGFR (African American)  Date Value Ref Range Status  05/20/2012 >60  Final   GFR calc Af Amer  Date Value Ref Range Status  07/09/2019 36 (L) >59 mL/min/1.73 Final    Comment:    **[facility] currently reports eGFR in compliance with the current**   recommendations of the [employer]. [facility] will   update reporting as new guidelines are published from the NKF-ASN   Task force.    EGFR (Non-African Amer.)  Date Value Ref Range Status  05/20/2012 59 (L)  Final    Comment:    eGFR values <77mL/min/1.73 m2 may be an indication of chronic kidney disease (CKD). Calculated eGFR is useful in patients with stable renal function. The eGFR calculation will not be reliable in acutely ill patients when serum creatinine is changing rapidly. It is not useful in  patients on dialysis. The eGFR calculation may not be applicable to patients at the low and high extremes of  body sizes, pregnant women, and vegetarians.    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[facility])   [facility] Birdie Sons, MD   2 months ago Fatigue due to excessive exertion, initial encounter   [facility] Tally Joe T, FNP   5 months 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[facility])   [facility] Birdie Sons, MD   1 year ago Annual physical exam   [facility] Birdie Sons, MD   1 year 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[facility])   [facility] Lelon Huh  E, MD      Future Appointments            In 2 weeks Ralene Bathe, MD [place]   In 1 month Fisher, Kirstie Peri, MD [facility], [facility]   In 4 months Gollan, Kathlene November, MD [facility], [facility]           Passed - CBC within normal limits and completed in the last 12 months    WBC  Date Value Ref Range Status  05/10/2021 11.5 (H) 3.4 - 10.8 x10E3/uL Final  12/30/2018 10.5 4.0 - 10.5 K/uL Final   RBC  Date Value Ref Range Status  05/10/2021 4.24 4.14 - 5.80 x10E6/uL Final  12/30/2018 4.25 4.22 - 5.81 MIL/uL Final   Hemoglobin  Date Value Ref Range Status  05/10/2021 13.1 13.0 - 17.7 g/dL Final   Hematocrit  Date Value Ref Range Status  05/10/2021 38.7 37.5 - 51.0 % Final   MCHC  Date Value Ref Range Status  05/10/2021 33.9 31.5 - 35.7 g/dL Final  12/30/2018 31.9 30.0 - 36.0 g/dL Final   [facility]  Date Value Ref Range Status  05/10/2021 30.9 26.6 - 33.0 pg Final  12/30/2018 29.9 26.0 - 34.0 pg Final   MCV  Date  Value Ref Range Status  05/10/2021 91 79 - 97 fL Final  08/07/2011 91 80 - 100 fL Final   No results found for: "PLTCOUNTKUC", "LABPLAT", "POCPLA" RDW  Date Value Ref Range Status  05/10/2021 13.6 11.6 - 15.4 % Final  08/07/2011 14.8 (H) 11.5 - 14.5 % Final         . 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[facility])   [facility] Birdie Sons, MD   2 months ago Fatigue due to excessive exertion, initial encounter   [facility] Tally Joe T, FNP   5 months 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 simvastatin (ZOCOR) 20 MG tablet [Pharmacy Med Name: SIMVASTATIN 20 MG TABLET] 90 tablet 3    Sig: TAKE 1 TABLET BY MOUTH EVERY DAY     Cardiovascular:  Antilipid - Statins Failed - 08/04/2021  7:44 PM      Failed - Lipid Panel in normal range within the last 12 months    Cholesterol, Total  Date Value Ref Range Status  07/14/2020 153 100 - 199 mg/dL  Final   Cholesterol  Date Value Ref Range Status  05/20/2012 131 0 - 200 mg/dL Final   Ldl Cholesterol, Calc  Date Value Ref Range Status  05/20/2012 56 0 - 100 mg/dL Final   LDL Chol Calc (NIH)  Date Value Ref Range Status  07/14/2020 75 0 - 99 mg/dL Final   HDL Cholesterol  Date Value Ref Range Status  05/20/2012 44 40 - 60 mg/dL Final   HDL  Date Value Ref Range Status  07/14/2020 47 >39 mg/dL Final   Triglycerides  Date Value Ref Range Status  07/14/2020 183 (H) 0 - 149 mg/dL Final  68/51/8799 117 0 - 200 mg/dL Final         Passed - Patient is not pregnant      Passed - Valid encounter within last 12 months    Recent Outpatient Visits          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 Malva Limes, MD   1 year ago Annual physical exam   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 clopidogrel (PLAVIX) 75 MG tablet [Pharmacy Med Name: CLOPIDOGREL 75 MG TABLET] 90 tablet 4    Sig: TAKE 1 TABLET BY MOUTH EVERY DAY     Hematology: Antiplatelets - clopidogrel Failed - 08/04/2021  7:44 PM      Failed - Cr in normal range and within 360 days    Creatinine  Date Value Ref Range Status  05/20/2012 1.24 0.60 - 1.30 mg/dL Final   Creatinine, Ser  Date Value Ref Range Status  05/10/2021 2.43 (H) 0.76 - 1.27 mg/dL Final   Creatinine, POC  Date Value  Ref Range Status  11/02/2018 n/a mg/dL Final         Passed - HCT in normal range and within 180 days    Hematocrit  Date Value Ref Range Status  05/10/2021 38.7 37.5 - 51.0 % Final         Passed - HGB in normal range and within 180 days    Hemoglobin  Date Value Ref Range Status  05/10/2021 13.1 13.0 - 17.7 g/dL Final         Passed - PLT in normal range and within 180 days    Platelets  Date Value Ref Range Status  05/10/2021 311 150 - 450 x10E3/uL Final         Passed - Valid encounter within last 6 months    Recent Outpatient Visits  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[facility])   [place], MD   2 months ago Fatigue due to excessive exertion, initial encounter   [facility] Tally Joe T, FNP   5 months 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

## 2021-08-20 ENCOUNTER — Ambulatory Visit: Payer: Medicare HMO | Admitting: Dermatology

## 2021-08-24 ENCOUNTER — Other Ambulatory Visit (INDEPENDENT_AMBULATORY_CARE_PROVIDER_SITE_OTHER): Payer: Self-pay | Admitting: Vascular Surgery

## 2021-08-24 DIAGNOSIS — I6521 Occlusion and stenosis of right carotid artery: Secondary | ICD-10-CM

## 2021-08-28 ENCOUNTER — Encounter (INDEPENDENT_AMBULATORY_CARE_PROVIDER_SITE_OTHER): Payer: Medicare HMO

## 2021-08-28 ENCOUNTER — Ambulatory Visit (INDEPENDENT_AMBULATORY_CARE_PROVIDER_SITE_OTHER): Payer: Medicare HMO | Admitting: Vascular Surgery

## 2021-08-28 ENCOUNTER — Encounter (INDEPENDENT_AMBULATORY_CARE_PROVIDER_SITE_OTHER): Payer: Self-pay

## 2021-09-01 ENCOUNTER — Other Ambulatory Visit: Payer: Self-pay | Admitting: Family Medicine

## 2021-09-01 DIAGNOSIS — E1121 Type 2 diabetes mellitus with diabetic nephropathy: Secondary | ICD-10-CM

## 2021-09-03 NOTE — Telephone Encounter (Signed)
Metoprolol and Metformin were refilled 06/12/2021 with a 6 month supply. Plavix was discontinued 06/12/2021. Requested Prescriptions  Pending Prescriptions Disp Refills  . 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[facility])   [place], MD   3 months ago Fatigue due to excessive exertion, initial encounter   [facility] Tally Joe T, FNP   6 months 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 clopidogrel (PLAVIX) 75 MG tablet [Pharmacy Med Name: CLOPIDOGREL 75 MG TABLET] 90 tablet 4    Sig: TAKE 1 TABLET BY MOUTH EVERY DAY     Hematology: Antiplatelets - clopidogrel Failed - 09/01/2021  7:44 PM      Failed - Cr in normal range and within 360 days    Creatinine  Date Value Ref Range Status  05/20/2012 1.24 0.60 - 1.30 mg/dL Final   Creatinine, Ser  Date Value Ref Range Status  05/10/2021 2.43 (H) 0.76 - 1.27 mg/dL Final   Creatinine, POC   Date Value Ref Range Status  11/02/2018 n/a mg/dL Final         Passed - HCT in normal range and within 180 days    Hematocrit  Date Value Ref Range Status  05/10/2021 38.7 37.5 - 51.0 % Final         Passed - HGB in normal range and within 180 days    Hemoglobin  Date Value Ref Range Status  05/10/2021 13.1 13.0 - 17.7 g/dL Final         Passed - PLT in normal range and within 180 days    Platelets  Date Value Ref Range Status  05/10/2021 311 150 - 450 x10E3/uL Final         Passed - Valid encounter within last 6 months    Recent Outpatient Visits          2 months 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 Birdie Sons, MD      Future Appointments            In 1 week Ralene Bathe, MD [place]   In 1 month Fisher, Kirstie Peri, MD [facility] [place]   In 3 months Gollan, Kathlene November, MD [facility], [facility]           . metFORMIN (GLUCOPHAGE) 850 MG tablet [Pharmacy Med Name: METFORMIN HCL 850 MG TABLET] 90 tablet 1    Sig: TAKE 1 TABLET (850 MG TOTAL) BY MOUTH EVERY EVENING.     Endocrinology:  Diabetes - Biguanides Failed - 09/01/2021  7:44 PM      Failed - Cr in normal range and within 360 days    Creatinine  Date Value Ref Range Status  05/20/2012 1.24 0.60 - 1.30 mg/dL Final   Creatinine, Ser  Date Value Ref Range Status  05/10/2021 2.43 (H) 0.76 - 1.27 mg/dL Final   Creatinine, POC  Date Value Ref Range Status  11/02/2018 n/a mg/dL Final         Failed - HBA1C is  between 0 and 7.9 and within 180 days    Hgb A1c MFr Bld  Date Value Ref Range Status  05/10/2021 8.2 (H) 4.8 - 5.6 % Final    Comment:             Prediabetes: 5.7 - 6.4          Diabetes: >6.4          Glycemic control for adults with diabetes: <7.0          Failed - eGFR in normal range and within 360 days    EGFR (African American)  Date Value Ref Range Status  05/20/2012 >60  Final   GFR calc Af Amer  Date Value Ref Range Status  07/09/2019 36 (L) >59 mL/min/1.73 Final    Comment:    **[facility] currently reports eGFR in compliance with the current**   recommendations of the [employer]. [facility] will   update reporting as new guidelines are published from the NKF-ASN   Task force.    EGFR (Non-African Amer.)  Date Value Ref Range Status  05/20/2012 59 (L)  Final    Comment:    eGFR values <82m/min/1.73 m2 may be an indication of chronic kidney disease (CKD). Calculated eGFR is useful in patients with stable renal function. The eGFR calculation will not be reliable in acutely ill patients when serum creatinine is changing rapidly. It is not useful in  patients on dialysis. The eGFR calculation may not be applicable to patients at the low and high extremes of body sizes, pregnant women, and vegetarians.    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[facility])   [facility] Birdie Sons, MD   3 months ago Fatigue due to excessive exertion, initial encounter   [facility] Tally Joe T, FNP   6 months 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[facility])    [facility] Birdie Sons, MD   1 year ago Annual physical exam   [facility] Birdie Sons, MD   1 year 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[facility])   [facility] Birdie Sons, MD      Future Appointments            In 1 week Ralene Bathe, MD [place]   In 1 month Fisher, Kirstie Peri, MD [facility]   In 3 months Gollan, Kathlene November, MD [place], [facility]           Passed - CBC within normal limits and completed in the last 12 months    WBC  Date Value Ref Range Status  05/10/2021 11.5 (H) 3.4 - 10.8 x10E3/uL Final  12/30/2018 10.5 4.0 - 10.5 K/uL Final   RBC  Date Value Ref Range Status  05/10/2021 4.24 4.14 - 5.80 x10E6/uL Final  12/30/2018 4.25 4.22 - 5.81 MIL/uL Final   Hemoglobin  Date Value Ref Range Status  05/10/2021 13.1 13.0 - 17.7 g/dL Final   Hematocrit  Date Value Ref Range Status  05/10/2021 38.7 37.5 - 51.0 % Final   MCHC  Date Value Ref Range Status  05/10/2021 33.9 31.5 - 35.7 g/dL Final  12/30/2018 31.9 30.0 - 36.0 g/dL Final   [facility]  Date Value Ref Range Status  05/10/2021 30.9 26.6 - 33.0 pg Final  12/30/2018 29.9 26.0 - 34.0 pg Final   MCV  Date Value Ref Range Status  05/10/2021 91 79 - 97 fL Final  08/07/2011 91 80 - 100 fL Final   No results found for: "PLTCOUNTKUC", "LABPLAT", "POCPLA" RDW  Date Value Ref Range Status  05/10/2021 13.6 11.6 - 15.4 % Final  08/07/2011 14.8 (H) 11.5 - 14.5 % Final

## 2021-09-05 ENCOUNTER — Ambulatory Visit (INDEPENDENT_AMBULATORY_CARE_PROVIDER_SITE_OTHER): Payer: Medicare HMO

## 2021-09-05 VITALS — Wt 176.0 lb

## 2021-09-05 DIAGNOSIS — Z0000 Encounter for general adult medical examination without abnormal findings: Secondary | ICD-10-CM
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
## 2021-09-05 NOTE — Patient Instructions (Signed)
Bobby Thomas , Thank you for taking time to come for your Medicare Wellness Visit. I appreciate your ongoing commitment to your health goals. Please review the following plan we discussed and let me know if I can assist you in the future.   Screening recommendations/referrals: Colonoscopy: aged out Recommended yearly ophthalmology/optometry visit for glaucoma screening and checkup Recommended yearly dental visit for hygiene and checkup  Vaccinations: Influenza vaccine: 12/26/20 Pneumococcal vaccine: 11/09/13 Tdap vaccine: 05/13/11, due if have injury Shingles vaccine: Zostavax 05/13/11   Shingrix 10/23/17, 01/07/18   Covid-19: 03/12/19, 04/02/19, 11/07/19, 07/14/20, 12/26/20  Advanced directives: no  Conditions/risks identified: none  Next appointment: Follow up in one year for your annual wellness visit. 09/09/22 @ 3:15 pm by phone  Preventive Care 65 Years and Older, Male Preventive care refers to lifestyle choices and visits with your health care provider that can promote health and wellness. What does preventive care include? A yearly physical exam. This is also called an annual well check. Dental exams once or twice a year. Routine eye exams. Ask your health care provider how often you should have your eyes checked. Personal lifestyle choices, including: Daily care of your teeth and gums. Regular physical activity. Eating a healthy diet. Avoiding tobacco and drug use. Limiting alcohol use. Practicing safe sex. Taking low doses of aspirin every day. Taking vitamin and mineral supplements as recommended by your health care provider. What happens during an annual well check? The services and screenings done by your health care provider during your annual well check will depend on your age, overall health, lifestyle risk factors, and family history of disease. Counseling  Your health care provider may ask you questions about your: Alcohol use. Tobacco use. Drug use. Emotional  well-being. Home and relationship well-being. Sexual activity. Eating habits. History of falls. Memory and ability to understand (cognition). Work and work Statistician. Screening  You may have the following tests or measurements: Height, weight, and BMI. Blood pressure. Lipid and cholesterol levels. These may be checked every 5 years, or more frequently if you are over 59 years old. Skin check. Lung cancer screening. You may have this screening every year starting at age 27 if you have a 30-pack-year history of smoking and currently smoke or have quit within the past 15 years. Fecal occult blood test (FOBT) of the stool. You may have this test every year starting at age 32. Flexible sigmoidoscopy or colonoscopy. You may have a sigmoidoscopy every 5 years or a colonoscopy every 10 years starting at age 33. Prostate cancer screening. Recommendations will vary depending on your family history and other risks. Hepatitis C blood test. Hepatitis B blood test. Sexually transmitted disease (STD) testing. Diabetes screening. This is done by checking your blood sugar (glucose) after you have not eaten for a while (fasting). You may have this done every 1-3 years. Abdominal aortic aneurysm (AAA) screening. You may need this if you are a current or former smoker. Osteoporosis. You may be screened starting at age 66 if you are at high risk. Talk with your health care provider about your test results, treatment options, and if necessary, the need for more tests. Vaccines  Your health care provider may recommend certain vaccines, such as: Influenza vaccine. This is recommended every year. Tetanus, diphtheria, and acellular pertussis (Tdap, Td) vaccine. You may need a Td booster every 10 years. Zoster vaccine. You may need this after age 80. Pneumococcal 13-valent conjugate (PCV13) vaccine. One dose is recommended after age 1. Pneumococcal polysaccharide (PPSV23)  vaccine. One dose is recommended after  age 64. Talk to your health care provider about which screenings and vaccines you need and how often you need them. This information is not intended to replace advice given to you by your health care provider. Make sure you discuss any questions you have with your health care provider. Document Released: 02/17/2015 Document Revised: 10/11/2015 Document Reviewed: 11/22/2014 Elsevier Interactive Patient Education  2017 [place] Prevention in the Home Falls can cause injuries. They can happen to people of all ages. There are many things you can do to make your home safe and to help prevent falls. What can I do on the outside of my home? Regularly fix the edges of walkways and driveways and fix any cracks. Remove anything that might make you trip as you walk through a door, such as a raised step or threshold. Trim any bushes or trees on the path to your home. Use bright outdoor lighting. Clear any walking paths of anything that might make someone trip, such as rocks or tools. Regularly check to see if handrails are loose or broken. Make sure that both sides of any steps have handrails. Any raised decks and porches should have guardrails on the edges. Have any leaves, snow, or ice cleared regularly. Use sand or salt on walking paths during winter. Clean up any spills in your garage right away. This includes oil or grease spills. What can I do in the bathroom? Use night lights. Install grab bars by the toilet and in the tub and shower. Do not use towel bars as grab bars. Use non-skid mats or decals in the tub or shower. If you need to sit down in the shower, use a plastic, non-slip stool. Keep the floor dry. Clean up any water that spills on the floor as soon as it happens. Remove soap buildup in the tub or shower regularly. Attach bath mats securely with double-sided non-slip rug tape. Do not have throw rugs and other things on the floor that can make you trip. What can I do in the  bedroom? Use night lights. Make sure that you have a light by your bed that is easy to reach. Do not use any sheets or blankets that are too big for your bed. They should not hang down onto the floor. Have a firm chair that has side arms. You can use this for support while you get dressed. Do not have throw rugs and other things on the floor that can make you trip. What can I do in the kitchen? Clean up any spills right away. Avoid walking on wet floors. Keep items that you use a lot in easy-to-reach places. If you need to reach something above you, use a strong step stool that has a grab bar. Keep electrical cords out of the way. Do not use floor polish or wax that makes floors slippery. If you must use wax, use non-skid floor wax. Do not have throw rugs and other things on the floor that can make you trip. What can I do with my stairs? Do not leave any items on the stairs. Make sure that there are handrails on both sides of the stairs and use them. Fix handrails that are broken or loose. Make sure that handrails are as long as the stairways. Check any carpeting to make sure that it is firmly attached to the stairs. Fix any carpet that is loose or worn. Avoid having throw rugs at the top or bottom  of the stairs. If you do have throw rugs, attach them to the floor with carpet tape. Make sure that you have a light switch at the top of the stairs and the bottom of the stairs. If you do not have them, ask someone to add them for you. What else can I do to help prevent falls? Wear shoes that: Do not have high heels. Have rubber bottoms. Are comfortable and fit you well. Are closed at the toe. Do not wear sandals. If you use a stepladder: Make sure that it is fully opened. Do not climb a closed stepladder. Make sure that both sides of the stepladder are locked into place. Ask someone to hold it for you, if possible. Clearly mark and make sure that you can see: Any grab bars or  handrails. First and last steps. Where the edge of each step is. Use tools that help you move around (mobility aids) if they are needed. These include: Canes. Walkers. Scooters. Crutches. Turn on the lights when you go into a dark area. Replace any light bulbs as soon as they burn out. Set up your furniture so you have a clear path. Avoid moving your furniture around. If any of your floors are uneven, fix them. If there are any pets around you, be aware of where they are. Review your medicines with your doctor. Some medicines can make you feel dizzy. This can increase your chance of falling. Ask your doctor what other things that you can do to help prevent falls. This information is not intended to replace advice given to you by your health care provider. Make sure you discuss any questions you have with your health care provider. Document Released: 11/17/2008 Document Revised: 06/29/2015 Document Reviewed: 02/25/2014 Elsevier Interactive Patient Education  2017 [employer].

## 2021-09-07 ENCOUNTER — Ambulatory Visit: Payer: Medicare HMO | Admitting: Family Medicine

## 2021-09-12 ENCOUNTER — Other Ambulatory Visit: Payer: Self-pay | Admitting: Cardiovascular Disease

## 2021-09-12 ENCOUNTER — Ambulatory Visit: Payer: Medicare HMO | Admitting: Dermatology

## 2021-09-12 DIAGNOSIS — I714 Abdominal aortic aneurysm, without rupture, unspecified: Secondary | ICD-10-CM

## 2021-09-24 ENCOUNTER — Ambulatory Visit: Admit: 2021-09-24 | Payer: Medicare HMO | Admitting: Gastroenterology

## 2021-09-24 SURGERY — COLONOSCOPY WITH PROPOFOL
Anesthesia: General

## 2021-10-11 DIAGNOSIS — E119 Type 2 diabetes mellitus without complications: Secondary | ICD-10-CM | POA: Diagnosis not present

## 2021-10-11 DIAGNOSIS — H02839 Dermatochalasis of unspecified eye, unspecified eyelid: Secondary | ICD-10-CM | POA: Diagnosis not present

## 2021-10-11 DIAGNOSIS — H2513 Age-related nuclear cataract, bilateral: Secondary | ICD-10-CM | POA: Diagnosis not present

## 2021-10-11 LAB — HM DIABETES EYE EXAM

## 2021-10-11 NOTE — Progress Notes (Signed)
I,Roshena L Chambers,acting as a scribe for Lelon Huh, MD.,have documented all relevant documentation on the behalf of Lelon Huh, MD,as directed by  Lelon Huh, MD while in the presence of Lelon Huh, MD.   Established patient visit   [name]   DOB: [birth year]   81 y.o. Male  MRN: [id number] Visit Date: 10/12/2021  Today's healthcare provider: Lelon Huh, MD   Chief Complaint  Patient presents with   Diabetes   Hypertension   Subjective    HPI  Diabetes Mellitus Type II, Follow-up  Lab Results  Component Value Date   HGBA1C 8.3 (A) 10/12/2021   HGBA1C 8.2 (H) 05/10/2021   HGBA1C 7.4 (A) 02/26/2021   Wt Readings from Last 3 Encounters:  10/12/21 175 lb (79.4 kg)  09/05/21 176 lb (79.8 kg)  06/27/21 176 lb (79.8 kg)   Last seen for diabetes 3 months ago.  Management since then includes continue same medication. Patient was advised to work on improving diet and being more active. He reports good compliance with treatment. He is not having side effects.  Symptoms: No fatigue No foot ulcerations  No appetite changes No nausea  No paresthesia of the feet  No polydipsia  No polyuria No visual disturbances   No vomiting     Home blood sugar records: fasting range: 120-160  Episodes of hypoglycemia? No    Current insulin regiment: none Most Recent Eye Exam: 10/05/2020 Current exercise: none Current diet habits: well balanced  Pertinent Labs: Lab Results  Component Value Date   CHOL 153 07/14/2020   HDL 47 07/14/2020   LDLCALC 75 07/14/2020   TRIG 183 (H) 07/14/2020   CHOLHDL 3.3 07/14/2020   Lab Results  Component Value Date   NA 142 05/10/2021   K 5.8 (H) 05/10/2021   CREATININE 2.43 (H) 05/10/2021   EGFR 27 (L) 05/10/2021   MICROALBUR 50 11/02/2018     ---------------------------------------------------------------------------------------------------   Hypertension, follow-up  BP Readings from Last 3  Encounters:  10/12/21 138/76  06/27/21 (!) 161/82  06/12/21 (!) 158/70   Wt Readings from Last 3 Encounters:  10/12/21 175 lb (79.4 kg)  09/05/21 176 lb (79.8 kg)  06/27/21 176 lb (79.8 kg)     He was last seen for hypertension 3 months ago.  BP at that visit was 161/82. Management since that visit includes continue same medication.  He reports good compliance with treatment. He is not having side effects.  He is following a Regular diet. He is not exercising. He does not smoke.  Use of agents associated with hypertension: none.   Outside blood pressures are checked and average . Symptoms: No chest pain No chest pressure  No palpitations No syncope  No dyspnea No orthopnea  No paroxysmal nocturnal dyspnea No lower extremity edema    ---------------------------------------------------------------------------------------------------   Medications: Outpatient Medications Prior to Visit  Medication Sig   apixaban (ELIQUIS) 5 MG TABS tablet Take 1 tablet (5 mg total) by mouth 2 (two) times daily.   clopidogrel (PLAVIX) 75 MG tablet    EPINEPHrine 0.3 mg/0.3 mL IJ SOAJ injection Inject 0.3 mg into the muscle as needed.   ezetimibe (ZETIA) 10 MG tablet Take 1 tablet (10 mg total) by mouth daily.   glipiZIDE (GLUCOTROL) 10 MG tablet Take 1 tablet (10 mg total) by mouth daily before breakfast.   LORazepam (ATIVAN) 1 MG tablet TAKE 1/2 TO 1 TABLET BY MOUTH NIGHTLY AT BEDTIME   losartan-hydrochlorothiazide (HYZAAR) 50-12.5  MG tablet    metFORMIN (GLUCOPHAGE) 850 MG tablet Take 1 tablet (850 mg total) by mouth every evening.   metoprolol tartrate (LOPRESSOR) 25 MG tablet Take 0.5 tablets (12.5 mg total) by mouth 2 (two) times daily.   ONETOUCH ULTRA test strip USE TO CHECK BLOOD SUGAR DAILY FOR TYPE 2 DIABETES DX: E11.9   pioglitazone (ACTOS) 30 MG tablet TAKE 1 TABLET BY MOUTH EVERY DAY   simvastatin (ZOCOR) 20 MG tablet TAKE 1 TABLET BY MOUTH EVERY DAY   furosemide (LASIX) 40 MG  tablet Take 40 mg by mouth daily.   No facility-administered medications prior to visit.    Review of Systems  Constitutional:  Negative for appetite change, chills and fever.  Respiratory:  Positive for cough. Negative for chest tightness, shortness of breath and wheezing.   Cardiovascular:  Negative for chest pain and palpitations.  Gastrointestinal:  Positive for abdominal pain (LLQ). Negative for nausea and vomiting.       Objective    BP 138/76 (BP Location: Right Arm, Patient Position: Sitting, Cuff Size: Normal)   Pulse 66   Temp 98.4 F (36.9 C) (Oral)   Resp 16   Wt 175 lb (79.4 kg)   SpO2 97% Comment: room air  BMI 28.25 kg/m    Today's Vitals   10/12/21 1022 10/12/21 1026  BP: (!) 152/71 138/76  Pulse: 74 66  Resp: 16   Temp: 98.4 F (36.9 C)   TempSrc: Oral   SpO2: 97%   Weight: 175 lb (79.4 kg)    Body mass index is 28.25 kg/m.   Physical Exam   General: Appearance:    Well developed, well nourished male in no acute distress  Eyes:    PERRL, conjunctiva/corneas clear, EOM's intact       Lungs:     Clear to auscultation bilaterally, respirations unlabored  Heart:    Normal heart rate. Regular rhythm. No murmurs, rubs, or gallops.    MS:   All extremities are intact.    Neurologic:   Awake, alert, oriented x 3. No apparent focal neurological defect.         Results for orders placed or performed in visit on 10/12/21  POCT HgB A1C  Result Value Ref Range   Hemoglobin A1C 8.3 (A) 4.0 - 5.6 %   Est. average glucose Bld gHb Est-mCnc 192     Assessment & Plan     1. 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([place]) Not at goal. Add dapagliflozin propanediol (FARXIGA) 5 MG TABS tablet; Take 1 tablet (5 mg total) by mouth daily before breakfast.  Dispense: 30 tablet; Refill: 2   2. Need for influenza vaccination  - Flu Vaccine QUAD High Dose IM (Fluad)  3. Chronic kidney disease (CKD), active medical management without dialysis, stage 4 (severe)  ([place]) Follow up Nephrology in October, as scheduled.   4. Stenosis of right carotid artery Carotids and abdominal u/s recently ordered by Dr. Rockey Situ     The entirety of the information documented in the History of Present Illness, Review of Systems and Physical Exam were personally obtained by me. Portions of this information were initially documented by the CMA and reviewed by me for thoroughness and accuracy.     Lelon Huh, MD  [facility] [phone] (phone) [phone] (fax)  [place]

## 2021-10-12 ENCOUNTER — Ambulatory Visit (INDEPENDENT_AMBULATORY_CARE_PROVIDER_SITE_OTHER): Payer: Medicare HMO | Admitting: Family Medicine

## 2021-10-12 ENCOUNTER — Encounter: Payer: Self-pay | Admitting: Family Medicine

## 2021-10-12 VITALS — BP 138/76 | HR 66 | Temp 98.4°F | Resp 16 | Wt 175.0 lb

## 2021-10-12 DIAGNOSIS — E1121 Type 2 diabetes mellitus with diabetic nephropathy: Secondary | ICD-10-CM | POA: Diagnosis not present

## 2021-10-12 DIAGNOSIS — N184 Chronic kidney disease, stage 4 (severe): Secondary | ICD-10-CM

## 2021-10-12 DIAGNOSIS — Z23 Encounter for immunization: Secondary | ICD-10-CM | POA: Diagnosis not present

## 2021-10-12 DIAGNOSIS — I6521 Occlusion and stenosis of right carotid artery: Secondary | ICD-10-CM

## 2021-10-12 LAB — POCT GLYCOSYLATED HEMOGLOBIN (HGB A1C)
Est. average glucose Bld gHb Est-mCnc: 192
Hemoglobin A1C: 8.3 % — AB (ref 4.0–5.6)

## 2021-10-12 MED ORDER — DAPAGLIFLOZIN PROPANEDIOL 5 MG PO TABS: 5.0000 mg | ORAL_TABLET | Freq: Every day | ORAL | 2 refills | Status: DC

## 2021-10-12 NOTE — Patient Instructions (Signed)
Please review the attached list of medications and notify my office if there are any errors.   You can take samples of the Eliquis 2.'5mg'$  by taking 2 tablets twice a day

## 2021-10-27 ENCOUNTER — Other Ambulatory Visit: Payer: Self-pay | Admitting: Family Medicine

## 2021-10-27 ENCOUNTER — Other Ambulatory Visit: Payer: Self-pay | Admitting: Cardiovascular Disease

## 2021-10-27 DIAGNOSIS — E1121 Type 2 diabetes mellitus with diabetic nephropathy: Secondary | ICD-10-CM

## 2021-10-29 DIAGNOSIS — N1832 Chronic kidney disease, stage 3b: Secondary | ICD-10-CM | POA: Diagnosis not present

## 2021-10-29 DIAGNOSIS — E1122 Type 2 diabetes mellitus with diabetic chronic kidney disease: Secondary | ICD-10-CM | POA: Diagnosis not present

## 2021-10-29 NOTE — Addendum Note (Signed)
Addended by: Johny Shock B on: 10/29/2021 02:41 PM   Modules accepted: Orders

## 2021-10-29 NOTE — Telephone Encounter (Signed)
Prescription refill request for Eliquis received. Indication: afib  Last office visit: [place], 06/12/2021 Scr: 2.43, 05/10/2021 Age: 81 yo  Weight: 79.4 kg   Refill sent.

## 2021-10-29 NOTE — Telephone Encounter (Signed)
Per Dr. Donivan Scull note from 06/12/2021: Recommend he stop aspirin Plavix and start Eliquis 5 twice daily.   At Dr. Donivan Scull visit pt's plavix was d/c. However, it was added back as a historical med on 09/05/2021 by Kirke Shaggy LPN at the medicare annual wellness exam.   Called and spoke to pt, who then gave phone to his wife Barbaraann Share. Verified that pt is taking Eliquis '5mg'$  BID. Also verified that pt stopped taking Plavix on 06/12/2021 because the pharmacist told her that pt should not take Plavix and Eliquis together.   Informed her that I would take Plavix off pt's medlist.

## 2021-10-29 NOTE — Telephone Encounter (Signed)
Refill Request.  

## 2021-11-03 ENCOUNTER — Other Ambulatory Visit: Payer: Self-pay | Admitting: Family Medicine

## 2021-11-06 DIAGNOSIS — E875 Hyperkalemia: Secondary | ICD-10-CM | POA: Diagnosis not present

## 2021-11-06 DIAGNOSIS — I10 Essential (primary) hypertension: Secondary | ICD-10-CM | POA: Diagnosis not present

## 2021-11-06 DIAGNOSIS — N2581 Secondary hyperparathyroidism of renal origin: Secondary | ICD-10-CM | POA: Diagnosis not present

## 2021-11-06 DIAGNOSIS — R809 Proteinuria, unspecified: Secondary | ICD-10-CM | POA: Diagnosis not present

## 2021-11-06 DIAGNOSIS — E1122 Type 2 diabetes mellitus with diabetic chronic kidney disease: Secondary | ICD-10-CM | POA: Diagnosis not present

## 2021-11-06 DIAGNOSIS — N1832 Chronic kidney disease, stage 3b: Secondary | ICD-10-CM | POA: Diagnosis not present

## 2021-11-19 DIAGNOSIS — R809 Proteinuria, unspecified: Secondary | ICD-10-CM | POA: Diagnosis not present

## 2021-11-19 DIAGNOSIS — N1832 Chronic kidney disease, stage 3b: Secondary | ICD-10-CM | POA: Diagnosis not present

## 2021-11-19 DIAGNOSIS — I10 Essential (primary) hypertension: Secondary | ICD-10-CM | POA: Diagnosis not present

## 2021-11-19 DIAGNOSIS — N2581 Secondary hyperparathyroidism of renal origin: Secondary | ICD-10-CM | POA: Diagnosis not present

## 2021-11-19 DIAGNOSIS — E1122 Type 2 diabetes mellitus with diabetic chronic kidney disease: Secondary | ICD-10-CM | POA: Diagnosis not present

## 2021-11-19 DIAGNOSIS — E875 Hyperkalemia: Secondary | ICD-10-CM | POA: Diagnosis not present

## 2021-11-29 ENCOUNTER — Other Ambulatory Visit: Payer: Self-pay | Admitting: Family Medicine

## 2021-12-03 ENCOUNTER — Encounter (INDEPENDENT_AMBULATORY_CARE_PROVIDER_SITE_OTHER): Payer: Self-pay
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
## 2021-12-17 ENCOUNTER — Encounter: Payer: Self-pay | Admitting: Cardiovascular Disease

## 2021-12-17 ENCOUNTER — Ambulatory Visit: Payer: Medicare HMO | Attending: Cardiovascular Disease | Admitting: Cardiovascular Disease

## 2021-12-17 VITALS — BP 116/70 | HR 59 | Ht 67.0 in | Wt 180.1 lb

## 2021-12-17 DIAGNOSIS — I4821 Permanent atrial fibrillation: Secondary | ICD-10-CM | POA: Diagnosis not present

## 2021-12-17 DIAGNOSIS — I739 Peripheral vascular disease, unspecified: Secondary | ICD-10-CM

## 2021-12-17 DIAGNOSIS — E1122 Type 2 diabetes mellitus with diabetic chronic kidney disease: Secondary | ICD-10-CM

## 2021-12-17 DIAGNOSIS — E782 Mixed hyperlipidemia: Secondary | ICD-10-CM | POA: Diagnosis not present

## 2021-12-17 DIAGNOSIS — N184 Chronic kidney disease, stage 4 (severe): Secondary | ICD-10-CM

## 2021-12-17 DIAGNOSIS — I10 Essential (primary) hypertension: Secondary | ICD-10-CM | POA: Diagnosis not present

## 2021-12-17 DIAGNOSIS — I6521 Occlusion and stenosis of right carotid artery: Secondary | ICD-10-CM | POA: Diagnosis not present

## 2021-12-17 MED ORDER — ROSUVASTATIN CALCIUM 40 MG PO TABS: 40.0000 mg | ORAL_TABLET | Freq: Every day | ORAL | 3 refills | Status: DC

## 2021-12-17 NOTE — Patient Instructions (Addendum)
Medication Instructions:  - Your physician has recommended you make the following change in your medication:   1) STOP Simvastatin  2) START Crestor (rosuvastatin) 40 mg: - take 1 tablet by mouth once daily  Samples Given: Eliquis 5 mg Lot: [id number] Exp: May 2025 # 1 box  If you need a refill on your cardiac medications before your next appointment, please call your pharmacy.    Lab work: No new labs needed   Testing/Procedures: No new testing needed   Follow-Up: At [facility], you and your health needs are our priority.  As part of our continuing mission to provide you with exceptional heart care, we have created designated Provider Care Teams.  These Care Teams include your primary Cardiologist (physician) and Advanced Practice Providers (APPs -  Physician Assistants and Nurse Practitioners) who all work together to provide you with the care you need, when you need it.  You will need a follow up appointment in 12 months  Providers on your designated Care Team:   Murray Hodgkins, NP Christell Faith, PA-C Cadence Kathlen Mody, [place]  COVID-19 Vaccine Information can be found at: [email or web address].uk For questions related to vaccine distribution or appointments, please email [email or web address] or call [phone].    Rosuvastatin Tablets What is this medication? ROSUVASTATIN (roe SOO va sta tin) treats high cholesterol and reduces the risk of heart attack and stroke. It works by decreasing bad cholesterol and fats (such as LDL, triglycerides) and increasing good cholesterol (HDL) in your blood. It belongs to a group of medications called statins. Changes to diet and exercise are often combined with this medication. This medicine may be used for other purposes; ask your health care provider or pharmacist if you have questions. COMMON BRAND NAME(S): Crestor What should I tell my care team before I take this  medication? They need to know if you have any of these conditions: Diabetes Frequently drink alcohol Kidney disease Liver disease Muscle cramps, pain Stroke Thyroid disease An unusual or allergic reaction to rosuvastatin, other medications, foods, dyes, or preservatives Pregnant or trying to get pregnant Breastfeeding How should I use this medication? Take this medication by mouth with a glass of water. Follow the directions on the prescription label. You can take it with or without food. If it upsets your stomach, take it with food. Do not cut, crush or chew this medication. Swallow the tablets whole. Take your medication at regular intervals. Do not take it more often than directed. Take antacids that have a combination of aluminum and magnesium hydroxide in them at a different time of day than this medication. Take these products 2 hours AFTER this medication. Talk to your care team about the use of this medication in children. While this medication may be prescribed for children as young as 7 for selected conditions, precautions do apply. Overdosage: If you think you have taken too much of this medicine contact a poison control center or emergency room at once. NOTE: This medicine is only for you. Do not share this medicine with others. What if I miss a dose? If you miss a dose, take it as soon as you can. If your next dose is to be taken in less than 12 hours, then do not take the missed dose. Take the next dose at your regular time. Do not take double or extra doses. What may interact with this medication? Do not take this medication with any of the following: Supplements like red yeast rice This medication may also interact with the  following: Alcohol Antacids containing aluminum hydroxide and magnesium hydroxide Cyclosporine Other medications for high cholesterol Some medications for HIV infection Warfarin This list may not describe all possible interactions. Give your health  care provider a list of all the medicines, herbs, non-prescription drugs, or dietary supplements you use. Also tell them if you smoke, drink alcohol, or use illegal drugs. Some items may interact with your medicine. What should I watch for while using this medication? Visit your care team for regular checks on your progress. Tell your care team if your symptoms do not start to get better or if they get worse. Your care team may tell you to stop taking this medication if you develop muscle problems. If your muscle problems do not go away after stopping this medication, contact your care team. Talk to your care team if you may be pregnant. Serious birth defects can occur if you take this medication during pregnancy. Talk to your care team before breastfeeding. Changes to your treatment plan may be needed. This medication may increase blood sugar. Ask your care team if changes in diet or medications are needed if you have diabetes. If you are going to need surgery or other procedure, tell your care team that you are using this medication. Taking this medication is only part of a total heart healthy program. Ask your care team if there are other changes you can make to improve your overall health. What side effects may I notice from receiving this medication? Side effects that you should report to your care team as soon as possible: Allergic reactions--skin rash, itching, hives, swelling of the face, lips, tongue, or throat High blood sugar (hyperglycemia)--increased thirst or amount of urine, unusual weakness, fatigue, blurry vision Liver injury--right upper belly pain, loss of appetite, nausea, light-colored stool, dark yellow or brown urine, yellowing skin or eyes, unusual weakness, fatigue Muscle injury--unusual weakness, fatigue, muscle pain, dark yellow or brown urine, decrease in amount of urine Redness, blistering, peeling or loosening of the skin, including inside the mouth Side effects that  usually do not require medical attention (report to your care team if they continue or are bothersome): Fatigue Headache Nausea Stomach pain This list may not describe all possible side effects. Call your doctor for medical advice about side effects. You may report side effects to FDA at 1-800-FDA-1088. Where should I keep my medication? Keep out of the reach of children and pets. Store between 20 and 25 degrees C (68 and 77 degrees F). Get rid of any unused medication after the expiration date. To get rid of medications that are no longer needed or have expired: Take the medication to a medication take-back program. Check with your pharmacy or law enforcement to find a location. If you cannot return the medication, check the label or package insert to see if the medication should be thrown out in the garbage or flushed down the toilet. If you are not sure, ask your care team. If it is safe to put it in the trash, take the medication out of the container. Mix the medication with cat litter, dirt, coffee grounds, or other unwanted substance. Seal the mixture in a bag or container. Put it in the trash. NOTE: This sheet is a summary. It may not cover all possible information. If you have questions about this medicine, talk to your doctor, pharmacist, or health care provider.  2023 Elsevier/Gold Standard (2020-02-18 00:00:00)

## 2022-01-10 NOTE — Progress Notes (Signed)
I,April Miller,acting as a scribe for Lelon Huh, MD.,have documented all relevant documentation on the behalf of Lelon Huh, MD,as directed by  Lelon Huh, MD while in the presence of Lelon Huh, MD.   Established patient visit   [name]   DOB: [birth year]   81 y.o. Male  MRN: [id number] Visit Date: 01/14/2022  Today's healthcare provider: Lelon Huh, MD   No chief complaint on file.  Subjective    HPI  Diabetes Mellitus Type II, follow-up  Lab Results  Component Value Date   HGBA1C 8.3 (A) 10/12/2021   HGBA1C 8.2 (H) 05/10/2021   HGBA1C 7.4 (A) 02/26/2021   Last seen for diabetes 3 months ago.  Management since then includes; Added Farxiga 5 mg which he is tolerating well.   Had labs by Dr Candiss Norse on 10/16 showing slight improvement of eGFR to 28 and normal electrolytes.   Most Recent Eye Exam: 10/11/2021  --------------------------------------------------------------------------------------------------- He also reports that he has trouble walking long distances. He walks around a track and [employer] and reports he has to stop and rest after about a lab due to his legs, but denies feeling short of breath or having any pains in his legs.   Medications: Outpatient Medications Prior to Visit  Medication Sig   apixaban (ELIQUIS) 5 MG TABS tablet TAKE 1 TABLET BY MOUTH TWICE A DAY   dapagliflozin propanediol (FARXIGA) 5 MG TABS tablet Take 1 tablet (5 mg total) by mouth daily before breakfast.   EPINEPHrine 0.3 mg/0.3 mL IJ SOAJ injection Inject 0.3 mg into the muscle as needed. (Patient not taking: Reported on 12/17/2021)   ezetimibe (ZETIA) 10 MG tablet Take 1 tablet (10 mg total) by mouth daily.   furosemide (LASIX) 40 MG tablet Take 40 mg by mouth daily.   glipiZIDE (GLUCOTROL) 10 MG tablet Take 1 tablet (10 mg total) by mouth daily before breakfast.   LORazepam (ATIVAN) 1 MG tablet TAKE 1/2 TO 1 TABLET BY MOUTH NIGHTLY AT BEDTIME    metFORMIN (GLUCOPHAGE) 850 MG tablet TAKE 1 TABLET (850 MG TOTAL) BY MOUTH EVERY EVENING.   metoprolol tartrate (LOPRESSOR) 25 MG tablet TAKE 1 TABLET BY MOUTH TWICE A DAY   ONETOUCH ULTRA test strip USE TO CHECK BLOOD SUGAR DAILY FOR TYPE 2 DIABETES DX: E11.9   pioglitazone (ACTOS) 30 MG tablet TAKE 1 TABLET BY MOUTH EVERY DAY   rosuvastatin (CRESTOR) 40 MG tablet Take 1 tablet (40 mg total) by mouth daily. (Has not started yet, still has simvastatin 25m which he is finishing by taking 2 every day until gone.)   No facility-administered medications prior to visit.    Review of Systems  Constitutional:  Negative for appetite change, chills and fever.  Respiratory:  Negative for chest tightness, shortness of breath and wheezing.   Cardiovascular:  Negative for chest pain and palpitations.  Gastrointestinal:  Negative for abdominal pain, nausea and vomiting.       Objective    BP (!) 155/77 (BP Location: Left Arm, Patient Position: Sitting, Cuff Size: Normal)   Pulse (!) 59   Resp 18   Wt 177 lb (80.3 kg)   SpO2 92%   BMI 27.72 kg/m    Physical Exam     General: Appearance:    Well developed, well nourished male in no acute distress  Eyes:    PERRL, conjunctiva/corneas clear, EOM's intact       Lungs:     Clear to auscultation bilaterally, respirations  unlabored  Heart:    Bradycardic. Irregularly irregular rhythm. No murmurs, rubs, or gallops.    MS:   All extremities are intact.    Neurologic:   Awake, alert, oriented x 3. No apparent focal neurological defect.        Results for orders placed or performed in visit on 01/14/22  POCT glycosylated hemoglobin (Hb A1C)  Result Value Ref Range   Hemoglobin A1C 8.6 (A) 4.0 - 5.6 %   Est. 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   Increase dapagliflozin propanediol (FARXIGA) to 10 MG TABS tablet; Take 1  tablet (5 mg total) by mouth daily before breakfast.  Dispense: 49 tablet; Refill: 0  2. Weakness of both legs He is still able to stand and walks at least a lap before having to rest, but he is concerned since he didn't use to have to stop and rest. Is mostly c/w deconditioning. May be related to statin. Has been on simvastatin for year, has prescription to change to rosuvastatin by his cardiologist, but he has not yet made change.  - Sed Rate (ESR) - CK (Creatine Kinase) - CBC  3. Chronic kidney disease (CKD), active medical management without dialysis, stage 4 (severe) ([place]) Increase Farxiga to 16m as above.  - Renal function panel - VITAMIN D 25 Hydroxy (Vit-D Deficiency, Fractures)  Has nephrology follow up scheduled in April.  4. Secondary hyperparathyroidism of renal origin ([facility]    Future Appointments  Date Time Provider DChadwicks 03/05/2022 10:30 AM KRalene Bathe MD [facility] None  03/08/2022  8:00 AM FBirdie Sons MD [facility] 09/09/2022  3:15 PM BFP-NURSE HEALTH ADVISOR [facility] P[facility]        The entirety of the information documented in the History of Present Illness, Review of Systems and Physical Exam were personally obtained by me. Portions of this information were initially documented by the CMA and reviewed by me for thoroughness and accuracy.     DLelon Huh MD  [phone](phone) [phone](fax)  CAugust

## 2022-01-14 ENCOUNTER — Ambulatory Visit (INDEPENDENT_AMBULATORY_CARE_PROVIDER_SITE_OTHER): Payer: Medicare HMO | Admitting: Family Medicine

## 2022-01-14 ENCOUNTER — Encounter: Payer: Self-pay | Admitting: Family Medicine

## 2022-01-14 VITALS — BP 155/77 | HR 59 | Resp 18 | Wt 177.0 lb

## 2022-01-14 DIAGNOSIS — R29898 Other symptoms and signs involving the musculoskeletal system: Secondary | ICD-10-CM | POA: Diagnosis not present

## 2022-01-14 DIAGNOSIS — E1121 Type 2 diabetes mellitus with diabetic nephropathy: Secondary | ICD-10-CM

## 2022-01-14 DIAGNOSIS — N2581 Secondary hyperparathyroidism of renal origin: Secondary | ICD-10-CM | POA: Diagnosis not present

## 2022-01-14 DIAGNOSIS — N184 Chronic kidney disease, stage 4 (severe): Secondary | ICD-10-CM | POA: Diagnosis not present

## 2022-01-14 LAB — POCT GLYCOSYLATED HEMOGLOBIN (HGB A1C)
Est. average glucose Bld gHb Est-mCnc: 200
Hemoglobin A1C: 8.6 % — AB (ref 4.0–5.6)

## 2022-01-14 MED ORDER — DAPAGLIFLOZIN PROPANEDIOL 5 MG PO TABS: 5.0000 mg | ORAL_TABLET | Freq: Every day | ORAL | 0 refills | Status: DC

## 2022-01-14 MED ORDER — DAPAGLIFLOZIN PROPANEDIOL 10 MG PO TABS: 10.0000 mg | ORAL_TABLET | Freq: Every day | ORAL | 0 refills | Status: DC

## 2022-01-14 NOTE — Patient Instructions (Signed)
Please review the attached list of medications and notify my office if there are any errors.   Stop taking metformin  Start taking samples of Farxiga '10mg'$  instead of the '5mg'$  tablets

## 2022-01-17 ENCOUNTER — Encounter: Payer: Self-pay | Admitting: *Deleted

## 2022-01-17 LAB — RENAL FUNCTION PANEL
Albumin: 4.4 g/dL (ref 3.8–4.8)
BUN/Creatinine Ratio: 24 (ref 10–24)
BUN: 51 mg/dL — ABNORMAL HIGH (ref 8–27)
CO2: 21 mmol/L (ref 20–29)
Calcium: 10.4 mg/dL — ABNORMAL HIGH (ref 8.6–10.2)
Chloride: 96 mmol/L (ref 96–106)
Creatinine, Ser: 2.17 mg/dL — ABNORMAL HIGH (ref 0.76–1.27)
Glucose: 202 mg/dL — ABNORMAL HIGH (ref 70–99)
Phosphorus: 3.9 mg/dL (ref 2.8–4.1)
Potassium: 5.1 mmol/L (ref 3.5–5.2)
Sodium: 137 mmol/L (ref 134–144)
eGFR: 30 mL/min/{1.73_m2} — ABNORMAL LOW (ref >59)

## 2022-01-17 LAB — CBC
Hematocrit: 43.5 % (ref 37.5–51.0)
Hemoglobin: 14.4 g/dL (ref 13.0–17.7)
MCH: 29.8 pg (ref 26.6–33.0)
MCHC: 33.1 g/dL (ref 31.5–35.7)
MCV: 90 fL (ref 79–97)
Platelets: 352 10*3/uL (ref 150–450)
RBC: 4.84 x10E6/uL (ref 4.14–5.80)
RDW: 13.6 % (ref 11.6–15.4)
WBC: 12.6 10*3/uL — ABNORMAL HIGH (ref 3.4–10.8)

## 2022-01-17 LAB — CK: Total CK: 42 U/L (ref 41–331)

## 2022-01-17 LAB — SEDIMENTATION RATE: Sed Rate: 12 mm/hr (ref 0–30)

## 2022-01-17 LAB — VITAMIN D 25 HYDROXY (VIT D DEFICIENCY, FRACTURES): Vit D, 25-Hydroxy: 34.2 ng/mL (ref 30.0–100.0)

## 2022-02-03 ENCOUNTER — Other Ambulatory Visit: Payer: Self-pay | Admitting: Family Medicine

## 2022-02-03 DIAGNOSIS — E1121 Type 2 diabetes mellitus with diabetic nephropathy: Secondary | ICD-10-CM

## 2022-02-05 NOTE — Telephone Encounter (Signed)
Requested Prescriptions  Pending Prescriptions Disp Refills   FARXIGA 5 MG TABS tablet [Pharmacy Med Name: FARXIGA 5 MG TABLET] 30 tablet 2    Sig: TAKE 1 TABLET BY MOUTH DAILY BEFORE BREAKFAST.     Endocrinology:  Diabetes - SGLT2 Inhibitors Failed - 02/03/2022  9:20 AM      Failed - Cr in normal range and within 360 days    Creatinine  Date Value Ref Range Status  05/20/2012 1.24 0.60 - 1.30 mg/dL Final   Creatinine, Ser  Date Value Ref Range Status  01/14/2022 2.17 (H) 0.76 - 1.27 mg/dL Final   Creatinine, POC  Date Value Ref Range Status  11/02/2018 n/a mg/dL Final         Failed - HBA1C is between 0 and 7.9 and within 180 days    Hemoglobin A1C  Date Value Ref Range Status  01/14/2022 8.6 (A) 4.0 - 5.6 % Final   Hgb A1c MFr Bld  Date Value Ref Range Status  05/10/2021 8.2 (H) 4.8 - 5.6 % Final    Comment:             Prediabetes: 5.7 - 6.4          Diabetes: >6.4          Glycemic control for adults with diabetes: <7.0          Failed - eGFR in normal range and within 360 days    EGFR (African American)  Date Value Ref Range Status  05/20/2012 >60  Final   GFR calc Af Amer  Date Value Ref Range Status  07/09/2019 36 (L) >59 mL/min/1.73 Final    Comment:    **[facility] currently reports eGFR in compliance with the current**   recommendations of the [employer]. [facility] will   update reporting as new guidelines are published from the NKF-ASN   Task force.    EGFR (Non-African Amer.)  Date Value Ref Range Status  05/20/2012 59 (L)  Final    Comment:    eGFR values <43m/min/1.73 m2 may be an indication of chronic kidney disease (CKD). Calculated eGFR is useful in patients with stable renal function. The eGFR calculation will not be reliable in acutely ill patients when serum creatinine is changing rapidly. It is not useful in  patients on dialysis. The eGFR calculation may not be applicable to patients at the low and high extremes of body  sizes, pregnant women, and vegetarians.    GFR calc non Af Amer  Date Value Ref Range Status  07/09/2019 31 (L) >59 mL/min/1.73 Final   eGFR  Date Value Ref Range Status  01/14/2022 30 (L) >59 mL/min/1.73 Final         Passed - Valid encounter within last 6 months    Recent Outpatient Visits           3 weeks 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([facility]   [facility]   3 months 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([facility]   [facility]   7 months 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([facility]   [facility]   9 months ago Fatigue due to excessive exertion, initial encounter   [facility]   11 months 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([facility]   [facility]       Future Appointments  In 4 weeks Ralene Bathe, MD [place]   In 1 month Fisher, Kirstie Peri, MD [facility], [place]

## 2022-02-14 ENCOUNTER — Other Ambulatory Visit: Payer: Self-pay | Admitting: Family Medicine

## 2022-02-14 DIAGNOSIS — E1121 Type 2 diabetes mellitus with diabetic nephropathy: Secondary | ICD-10-CM

## 2022-02-18 ENCOUNTER — Ambulatory Visit: Payer: Self-pay

## 2022-02-18 NOTE — Telephone Encounter (Signed)
Increase glipizide to twice a day, one tablet before breakfast, once tablet before supper

## 2022-02-18 NOTE — Telephone Encounter (Signed)
  Chief Complaint: BS over 200 for a week Symptoms: denies Frequency: 1 week  Pertinent Negatives: Patient denies frequent urination, breathing difficulty  Disposition: '[]'$ ED /'[]'$ Urgent Care (no appt availability in office) / '[]'$ Appointment(In office/virtual)/ '[]'$  [place] [facility]/ '[]'$ Home Care/ '[]'$ Refused Recommended Disposition /'[]'$ [place] [facility]/ '[x]'$  Follow-up with PCP Additional Notes: see office note for changes in therapy. Pt's wife asking for adjustments in tx Reason for Disposition  [1] Caller has URGENT medication or insulin pump question AND [2] triager unable to answer question  Answer Assessment - Initial Assessment Questions 1. BLOOD GLUCOSE: "What is your blood glucose level?"      424 yesterday pm  2. ONSET: "When did you check the blood glucose?"     216 3. USUAL RANGE: "What is your glucose level usually?" (e.g., usual fasting morning value, usual evening value)     130-178 4. KETONES: "Do you check for ketones (urine or blood test strips)?" If Yes, ask: "What does the test show now?"      no 5. TYPE 1 or 2:  "Do you know what type of diabetes you have?"  (e.g., Type 1, Type 2, Gestational; doesn't know)      Type 2  6. INSULIN: "Do you take insulin?" "What type of insulin(s) do you use? What is the mode of delivery? (syringe, pen; injection or pump)?"      no 7. DIABETES PILLS: "Do you take any pills for your diabetes?" If Yes, ask: "Have you missed taking any pills recently?"     yes 8. OTHER SYMPTOMS: "Do you have any symptoms?" (e.g., fever, frequent urination, difficulty breathing, dizziness, weakness, vomiting)     no 9. PREGNANCY: "Is there any chance you are pregnant?" "When was your last menstrual period?"     N/a  Protocols used: Diabetes - High Blood Sugar-A-AH

## 2022-02-18 NOTE — Telephone Encounter (Signed)
Patient wife advised. Verbalized understanding 

## 2022-02-19 ENCOUNTER — Other Ambulatory Visit: Payer: Self-pay | Admitting: Family Medicine

## 2022-02-19 DIAGNOSIS — E1121 Type 2 diabetes mellitus with diabetic nephropathy: Secondary | ICD-10-CM

## 2022-02-19 NOTE — Telephone Encounter (Signed)
Requested Prescriptions  Pending Prescriptions Disp Refills   ONETOUCH ULTRA test strip [Pharmacy Med Name: [place] TEST STRP] 100 strip 8    Sig: USE TO CHECK BLOOD SUGAR DAILY FOR TYPE 2 DIABETES DX: E11.9     Endocrinology: Diabetes - Testing Supplies Passed - 02/19/2022  1:30 AM      Passed - Valid encounter within last 12 months    Recent Outpatient Visits           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[facility])   [facility] Birdie Sons, MD   4 months 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[facility])   [facility] Birdie Sons, MD   7 months 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[facility])   [facility] Birdie Sons, MD   9 months ago Fatigue due to excessive exertion, initial encounter   [facility] Tally Joe T, FNP   11 months 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[facility])   [facility] Birdie Sons, MD       Future Appointments             In 2 weeks Ralene Bathe, MD [place]   In 2 weeks Fisher, Kirstie Peri, MD [facility], [place]

## 2022-03-05 ENCOUNTER — Ambulatory Visit: Payer: Medicare HMO | Admitting: Dermatology

## 2022-03-05 DIAGNOSIS — L578 Other skin changes due to chronic exposure to nonionizing radiation: Secondary | ICD-10-CM

## 2022-03-05 DIAGNOSIS — L570 Actinic keratosis: Secondary | ICD-10-CM | POA: Diagnosis not present

## 2022-03-05 NOTE — Progress Notes (Signed)
   Follow-Up Visit   Subjective  Bobby Thomas is a 82 y.o. male who presents for the following: Other (Spots on scalp that are irritating). The patient has spots, moles and lesions to be evaluated, some may be new or changing and the patient has concerns that these could be cancer.  The following portions of the chart were reviewed this encounter and updated as appropriate:   Tobacco  Allergies  Meds  Problems  Med Hx  Surg Hx  Fam Hx     Review of Systems:  No other skin or systemic complaints except as noted in HPI or Assessment and Plan.  Objective  Well appearing patient in no apparent distress; mood and affect are within normal limits.  A focused examination was performed including scalp, face, arms, hands. Relevant physical exam findings are noted in the Assessment and Plan.  Scalp (12) Hyperkeratotic pink papules   Assessment & Plan  AK (actinic keratosis) (12) Scalp  Actinic Damage with PreCancerous Actinic Keratoses Counseling for Topical Chemotherapy Management: Patient exhibits: - Severe, confluent actinic changes with pre-cancerous actinic keratoses that is secondary to cumulative UV radiation exposure over time - Condition that is severe; chronic, not at goal. - diffuse scaly erythematous macules and papules with underlying dyspigmentation - Discussed Prescription "Field Treatment" topical Chemotherapy for Severe, Chronic Confluent Actinic Changes with Pre-Cancerous Actinic Keratoses Field treatment involves treatment of an entire area of skin that has confluent Actinic Changes (Sun/ Ultraviolet light damage) and PreCancerous Actinic Keratoses by method of PhotoDynamic Therapy (PDT) and/or prescription Topical Chemotherapy agents such as 5-fluorouracil, 5-fluorouracil/calcipotriene, and/or imiquimod.  The purpose is to decrease the number of clinically evident and subclinical PreCancerous lesions to prevent progression to development of skin cancer by chemically  destroying early precancer changes that may or may not be visible.  It has been shown to reduce the risk of developing skin cancer in the treated area. As a result of treatment, redness, scaling, crusting, and open sores may occur during treatment course. One or more than one of these methods may be used and may have to be used several times to control, suppress and eliminate the PreCancerous changes. Discussed treatment course, expected reaction, and possible side effects. - Recommend daily broad spectrum sunscreen SPF 30+ to sun-exposed areas, reapply every 2 hours as needed.  - Staying in the shade or wearing long sleeves, sun glasses (UVA+UVB protection) and wide brim hats (4-inch brim around the entire circumference of the hat) are also recommended. - Call for new or changing lesions.  May consider field treatment on follow up.  Destruction of lesion - Scalp Complexity: simple   Destruction method: cryotherapy   Informed consent: discussed and consent obtained   Timeout:  patient name, date of birth, surgical site, and procedure verified Lesion destroyed using liquid nitrogen: Yes   Region frozen until ice ball extended beyond lesion: Yes   Outcome: patient tolerated procedure well with no complications   Post-procedure details: wound care instructions given    Return in about 4 months (around 07/04/2022) for AK follow up.  I, Ashok Cordia, CMA, am acting as scribe for Sarina Ser, MD . Documentation: I have reviewed the above documentation for accuracy and completeness, and I agree with the above.  Sarina Ser, MD

## 2022-03-05 NOTE — Patient Instructions (Signed)
Cryotherapy Aftercare  Wash gently with soap and water everyday.   Apply Vaseline and Band-Aid daily until healed.     Due to recent changes in healthcare laws, you may see results of your pathology and/or laboratory studies on MyChart before the doctors have had a chance to review them. We understand that in some cases there may be results that are confusing or concerning to you. Please understand that not all results are received at the same time and often the doctors may need to interpret multiple results in order to provide you with the best plan of care or course of treatment. Therefore, we ask that you please give us 2 business days to thoroughly review all your results before contacting the office for clarification. Should we see a critical lab result, you will be contacted sooner.   If You Need Anything After Your Visit  If you have any questions or concerns for your doctor, please call our main line at [phone] and press option 4 to reach your doctor's medical assistant. If no one answers, please leave a voicemail as directed and we will return your call as soon as possible. Messages left after 4 pm will be answered the following business day.   You may also send us a message via MyChart. We typically respond to MyChart messages within 1-2 business days.  For prescription refills, please ask your pharmacy to contact our office. Our fax number is [phone].  If you have an urgent issue when the clinic is closed that cannot wait until the next business day, you can page your doctor at the number below.    Please note that while we do our best to be available for urgent issues outside of office hours, we are not available 24/7.   If you have an urgent issue and are unable to reach us, you may choose to seek medical care at your doctor's office, retail clinic, urgent care center, or emergency room.  If you have a medical emergency, please immediately call 911 or go to the  emergency department.  Pager Numbers  - Dr. Kowalski: [phone]  - Dr. Moye: [phone]  - Dr. Stewart: [phone]  In the event of inclement weather, please call our main line at [phone] for an update on the status of any delays or closures.  Dermatology Medication Tips: Please keep the boxes that topical medications come in in order to help keep track of the instructions about where and how to use these. Pharmacies typically print the medication instructions only on the boxes and not directly on the medication tubes.   If your medication is too expensive, please contact our office at [phone] option 4 or send us a message through MyChart.   We are unable to tell what your co-pay for medications will be in advance as this is different depending on your insurance coverage. However, we may be able to find a substitute medication at lower cost or fill out paperwork to get insurance to cover a needed medication.   If a prior authorization is required to get your medication covered by your insurance company, please allow us 1-2 business days to complete this process.  Drug prices often vary depending on where the prescription is filled and some pharmacies may offer cheaper prices.  The website www.goodrx.com contains coupons for medications through different pharmacies. The prices here do not account for what the cost may be with help from insurance (it may be cheaper with your insurance), but the website can   give you the price if you did not use any insurance.  - You can print the associated coupon and take it with your prescription to the pharmacy.  - You may also stop by our office during regular business hours and pick up a GoodRx coupon card.  - If you need your prescription sent electronically to a different pharmacy, notify our office through [place] MyChart or by phone at [phone] option 4.     Si Usted Necesita Algo Despus de Su Visita  Tambin puede  enviarnos un mensaje a travs de MyChart. Por lo general respondemos a los mensajes de MyChart en el transcurso de 1 a 2 das hbiles.  Para renovar recetas, por favor pida a su farmacia que se ponga en contacto con nuestra oficina. Nuestro nmero de fax es el [phone].  Si tiene un asunto urgente cuando la clnica est cerrada y que no puede esperar hasta el siguiente da hbil, puede llamar/localizar a su doctor(a) al nmero que aparece a continuacin.   Por favor, tenga en cuenta que aunque hacemos todo lo posible para estar disponibles para asuntos urgentes fuera del horario de oficina, no estamos disponibles las 24 horas del da, los 7 das de la semana.   Si tiene un problema urgente y no puede comunicarse con nosotros, puede optar por buscar atencin mdica  en el consultorio de su doctor(a), en una clnica privada, en un centro de atencin urgente o en una sala de emergencias.  Si tiene una emergencia mdica, por favor llame inmediatamente al 911 o vaya a la sala de emergencias.  Nmeros de bper  - Dr. Kowalski: [phone]  - Dra. Moye: [phone]  - Dra. Stewart: [phone]  En caso de inclemencias del tiempo, por favor llame a nuestra lnea principal al [phone] para una actualizacin sobre el estado de cualquier retraso o cierre.  Consejos para la medicacin en dermatologa: Por favor, guarde las cajas en las que vienen los medicamentos de uso tpico para ayudarle a seguir las instrucciones sobre dnde y cmo usarlos. Las farmacias generalmente imprimen las instrucciones del medicamento slo en las cajas y no directamente en los tubos del medicamento.   Si su medicamento es muy caro, por favor, pngase en contacto con nuestra oficina llamando al [phone] y presione la opcin 4 o envenos un mensaje a travs de MyChart.   No podemos decirle cul ser su copago por los medicamentos por adelantado ya que esto es diferente dependiendo de la cobertura de su seguro.  Sin embargo, es posible que podamos encontrar un medicamento sustituto a menor costo o llenar un formulario para que el seguro cubra el medicamento que se considera necesario.   Si se requiere una autorizacin previa para que su compaa de seguros cubra su medicamento, por favor permtanos de 1 a 2 das hbiles para completar este proceso.  Los precios de los medicamentos varan con frecuencia dependiendo del lugar de dnde se surte la receta y alguna farmacias pueden ofrecer precios ms baratos.  El sitio web www.goodrx.com tiene cupones para medicamentos de diferentes farmacias. Los precios aqu no tienen en cuenta lo que podra costar con la ayuda del seguro (puede ser ms barato con su seguro), pero el sitio web puede darle el precio si no utiliz ningn seguro.  - Puede imprimir el cupn correspondiente y llevarlo con su receta a la farmacia.  - Tambin puede pasar por nuestra oficina durante el horario de atencin regular y recoger una tarjeta de cupones de GoodRx.  -   Si necesita que su receta se enve electrnicamente a una farmacia diferente, informe a nuestra oficina a travs de MyChart de [place] o por telfono llamando al [phone] y presione la opcin 4.  

## 2022-03-07 NOTE — Progress Notes (Signed)
Established patient visit   [name]   DOB: October 12, 1940   82 y.o. Male  MRN: [id number] Visit Date: 03/08/2022  Today's healthcare provider: Lelon Huh, MD    Subjective    HPI  Diabetes Mellitus Type II, Follow-up  Lab Results  Component Value Date   HGBA1C 8.6 (A) 01/14/2022   HGBA1C 8.3 (A) 10/12/2021   HGBA1C 8.2 (H) 05/10/2021   Wt Readings from Last 3 Encounters:  01/14/22 177 lb (80.3 kg)  12/17/21 180 lb 2 oz (81.7 kg)  10/12/21 175 lb (79.4 kg)   Last seen for diabetes 1 months ago.  Management since then includes Stop metformin due to eGFR consistently under 30.   Increased dapagliflozin propanediol (FARXIGA) to 10 MG.  He reports excellent compliance with treatment. He is not having side effects.   Pertinent Labs: Lab Results  Component Value Date   CHOL 153 07/14/2020   HDL 47 07/14/2020   LDLCALC 75 07/14/2020   TRIG 183 (H) 07/14/2020   CHOLHDL 3.3 07/14/2020   Lab Results  Component Value Date   NA 137 01/14/2022   K 5.1 01/14/2022   CREATININE 2.17 (H) 01/14/2022   EGFR 30 (L) 01/14/2022   MICRALBCREAT 0.521 02/06/2021     ---------------------------------------------------------------------------------------------------   Medications: Outpatient Medications Prior to Visit  Medication Sig   apixaban (ELIQUIS) 5 MG TABS tablet TAKE 1 TABLET BY MOUTH TWICE A DAY   dapagliflozin propanediol (FARXIGA) 10 MG TABS tablet Take 1 tablet (10 mg total) by mouth daily before breakfast.   EPINEPHrine 0.3 mg/0.3 mL IJ SOAJ injection Inject 0.3 mg into the muscle as needed.   ezetimibe (ZETIA) 10 MG tablet Take 1 tablet (10 mg total) by mouth daily.   furosemide (LASIX) 40 MG tablet Take 40 mg by mouth daily.   glipiZIDE (GLUCOTROL) 10 MG tablet TAKE 1 TABLET BY MOUTH DAILY BEFORE BREAKFAST.   LORazepam (ATIVAN) 1 MG tablet TAKE 1/2 TO 1 TABLET BY MOUTH NIGHTLY AT BEDTIME   metoprolol tartrate (LOPRESSOR) 25 MG tablet TAKE 1  TABLET BY MOUTH TWICE A DAY   ONETOUCH ULTRA test strip USE TO CHECK BLOOD SUGAR DAILY FOR TYPE 2 DIABETES DX: E11.9   pioglitazone (ACTOS) 30 MG tablet TAKE 1 TABLET BY MOUTH EVERY DAY   rosuvastatin (CRESTOR) 40 MG tablet Take 1 tablet (40 mg total) by mouth daily.   No facility-administered medications prior to visit.    Review of Systems  Constitutional:  Negative for appetite change, chills and fever.  Respiratory:  Negative for chest tightness, shortness of breath and wheezing.   Cardiovascular:  Negative for chest pain and palpitations.  Gastrointestinal:  Negative for abdominal pain, nausea and vomiting.       Objective    BP 127/68 (BP Location: Right Arm, Patient Position: Sitting, Cuff Size: Normal)   Pulse 61   Temp 97.6 F (36.4 C)   Ht '5\' 7"'$  (1.702 m)   Wt 172 lb (78 kg)   SpO2 98%   BMI 26.94 kg/m      Results for orders placed or performed in visit on 03/08/22  POCT glycosylated hemoglobin (Hb A1C)  Result Value Ref Range   Hemoglobin A1C 11.4 (A) 4.0 - 5.6 %   Est. average glucose Bld gHb Est-mCnc 280   280 mg/dL   Assessment & Plan     1. 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(HCC) A1c much higher since stopping metformin due to low eGFR. Is now  on full renal protective dose of Farxiga  - Renal function panel  Consider starting back on low dose metformin if eGFR improves sufficiently. Otherwise will need to consider adding a different medication.   2. Mixed hyperlipidemia He is tolerating rosuvastatin and ezetimibe well with no adverse effects.   - Lipid panel  3. Prostate cancer screening  - PSA Total (Reflex To Free) ([facility] only)  4. Need for immunization against influenza  - Flu Vaccine QUAD 47moIM (Fluarix, Fluzone & Alfiuria Quad PF)           DLelon Huh MD  [phone](phone) [phone](fax)  [place]

## 2022-03-08 ENCOUNTER — Ambulatory Visit (INDEPENDENT_AMBULATORY_CARE_PROVIDER_SITE_OTHER): Payer: Medicare HMO | Admitting: Family Medicine

## 2022-03-08 ENCOUNTER — Encounter: Payer: Self-pay | Admitting: Family Medicine

## 2022-03-08 ENCOUNTER — Encounter: Payer: Self-pay | Admitting: Dermatology

## 2022-03-08 VITALS — BP 127/68 | HR 61 | Temp 97.6°F | Ht 67.0 in | Wt 172.0 lb

## 2022-03-08 DIAGNOSIS — E782 Mixed hyperlipidemia: Secondary | ICD-10-CM

## 2022-03-08 DIAGNOSIS — E1121 Type 2 diabetes mellitus with diabetic nephropathy: Secondary | ICD-10-CM

## 2022-03-08 DIAGNOSIS — Z23 Encounter for immunization: Secondary | ICD-10-CM | POA: Diagnosis not present

## 2022-03-08 DIAGNOSIS — Z125 Encounter for screening for malignant neoplasm of prostate: Secondary | ICD-10-CM | POA: Diagnosis not present

## 2022-03-08 LAB — POCT GLYCOSYLATED HEMOGLOBIN (HGB A1C)
Est. average glucose Bld gHb Est-mCnc: 280
Hemoglobin A1C: 11.4 % — AB (ref 4.0–5.6)

## 2022-03-08 NOTE — Patient Instructions (Signed)
.   Please review the attached list of medications and notify my office if there are any errors.   . Please bring all of your medications to every appointment so we can make sure that our medication list is the same as yours.   

## 2022-03-09 LAB — LIPID PANEL
Chol/HDL Ratio: 1.9 ratio (ref 0.0–5.0)
Cholesterol, Total: 87 mg/dL — ABNORMAL LOW (ref 100–199)
HDL: 45 mg/dL (ref >39)
LDL Chol Calc (NIH): 22 mg/dL (ref 0–99)
Triglycerides: 108 mg/dL (ref 0–149)
VLDL Cholesterol Cal: 20 mg/dL (ref 5–40)

## 2022-03-09 LAB — RENAL FUNCTION PANEL
Albumin: 4.3 g/dL (ref 3.8–4.8)
BUN/Creatinine Ratio: 25 — ABNORMAL HIGH (ref 10–24)
BUN: 53 mg/dL — ABNORMAL HIGH (ref 8–27)
CO2: 21 mmol/L (ref 20–29)
Calcium: 10 mg/dL (ref 8.6–10.2)
Chloride: 95 mmol/L — ABNORMAL LOW (ref 96–106)
Creatinine, Ser: 2.08 mg/dL — ABNORMAL HIGH (ref 0.76–1.27)
Glucose: 287 mg/dL — ABNORMAL HIGH (ref 70–99)
Phosphorus: 4.4 mg/dL — ABNORMAL HIGH (ref 2.8–4.1)
Potassium: 4.9 mmol/L (ref 3.5–5.2)
Sodium: 137 mmol/L (ref 134–144)
eGFR: 32 mL/min/{1.73_m2} — ABNORMAL LOW (ref >59)

## 2022-03-09 LAB — PSA TOTAL (REFLEX TO FREE): Prostate Specific Ag, Serum: 0.2 ng/mL (ref 0.0–4.0)

## 2022-03-15 ENCOUNTER — Telehealth: Payer: Self-pay | Admitting: Family Medicine

## 2022-03-15 NOTE — Telephone Encounter (Signed)
Please advise pt's wife that we got in more samples of Farxiga 41m, I left samples for them at front desk.

## 2022-03-28 ENCOUNTER — Encounter: Payer: Self-pay | Admitting: Cardiovascular Disease

## 2022-04-15 ENCOUNTER — Other Ambulatory Visit: Payer: Self-pay | Admitting: Family Medicine

## 2022-04-15 ENCOUNTER — Other Ambulatory Visit: Payer: Self-pay | Admitting: Cardiovascular Disease

## 2022-04-15 DIAGNOSIS — E1121 Type 2 diabetes mellitus with diabetic nephropathy: Secondary | ICD-10-CM

## 2022-04-16 NOTE — Telephone Encounter (Signed)
Pts wife called for refill request status for Bobby Thomas / this request was denied and states the refill was not appropriate / pts wife stated that when Dr. Caryn Section last filled it he changed the dose from '5MG'$  to dapagliflozin propanediol (FARXIGA) 10 MG TABS tablet [id number]  / please advise about refill request

## 2022-04-16 NOTE — Telephone Encounter (Signed)
No longer current dosing of this medication  Requested Prescriptions  Pending Prescriptions Disp Refills   FARXIGA 5 MG TABS tablet [Pharmacy Med Name: FARXIGA 5 MG TABLET] 30 tablet 2    Sig: TAKE 1 TABLET BY MOUTH DAILY BEFORE BREAKFAST.     Endocrinology:  Diabetes - SGLT2 Inhibitors Failed - 04/15/2022  8:21 PM      Failed - Cr in normal range and within 360 days    Creatinine  Date Value Ref Range Status  05/20/2012 1.24 0.60 - 1.30 mg/dL Final   Creatinine, Ser  Date Value Ref Range Status  03/08/2022 2.08 (H) 0.76 - 1.27 mg/dL Final   Creatinine, POC  Date Value Ref Range Status  11/02/2018 n/a mg/dL Final         Failed - HBA1C is between 0 and 7.9 and within 180 days    Hemoglobin A1C  Date Value Ref Range Status  03/08/2022 11.4 (A) 4.0 - 5.6 % Final   Hgb A1c MFr Bld  Date Value Ref Range Status  05/10/2021 8.2 (H) 4.8 - 5.6 % Final    Comment:             Prediabetes: 5.7 - 6.4          Diabetes: >6.4          Glycemic control for adults with diabetes: <7.0          Failed - eGFR in normal range and within 360 days    EGFR (African American)  Date Value Ref Range Status  05/20/2012 >60  Final   GFR calc Af Amer  Date Value Ref Range Status  07/09/2019 36 (L) >59 mL/min/1.73 Final    Comment:    **[facility] currently reports eGFR in compliance with the current**   recommendations of the [employer]. [facility] will   update reporting as new guidelines are published from the NKF-ASN   Task force.    EGFR (Non-African Amer.)  Date Value Ref Range Status  05/20/2012 59 (L)  Final    Comment:    eGFR values <42m/min/1.73 m2 may be an indication of chronic kidney disease (CKD). Calculated eGFR is useful in patients with stable renal function. The eGFR calculation will not be reliable in acutely ill patients when serum creatinine is changing rapidly. It is not useful in  patients on dialysis. The eGFR calculation may not be applicable to  patients at the low and high extremes of body sizes, pregnant women, and vegetarians.    GFR calc non Af Amer  Date Value Ref Range Status  07/09/2019 31 (L) >59 mL/min/1.73 Final   eGFR  Date Value Ref Range Status  03/08/2022 32 (L) >59 mL/min/1.73 Final         Passed - Valid encounter within last 6 months    Recent Outpatient Visits           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[facility]   [place]   3 months 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([facility]   [place]   6 months 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([facility]   [place]   9 months 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([facility]   [place]   11 months ago Fatigue due to excessive exertion, initial encounter   [place]  Jaci Standard, FNP       Future Appointments             In 1 week Fisher, Kirstie Peri, MD [facility], [place]   In 1 month Ralene Bathe, MD [place]

## 2022-04-17 NOTE — Telephone Encounter (Signed)
Pt wife is calling back to check on the status of his medication refill. Per Barbaraann Share pt has an appt with PCP on 04/29/22 @ 8:20am and he will be taking his last pill on Friday morning (04/19/22). Barbaraann Share is wanting to know if his medication can please be filled.   Barbaraann Share is wanting to know if a short supply can be sent into the pharmacy until pt next appointment. Barbaraann Share is wanting a call back.    Please advise.

## 2022-04-18 ENCOUNTER — Telehealth: Payer: Self-pay | Admitting: Family Medicine

## 2022-04-18 MED ORDER — DAPAGLIFLOZIN PROPANEDIOL 10 MG PO TABS: 10.0000 mg | ORAL_TABLET | Freq: Every day | ORAL | 1 refills | Status: DC

## 2022-04-18 NOTE — Addendum Note (Signed)
Addended by: Birdie Sons on: 04/18/2022 11:48 AM   Modules accepted: Orders

## 2022-04-18 NOTE — Telephone Encounter (Signed)
Farxiga 10 mg is needed to last until 3/29 now instead of 3/25 because patient takes the last one he has now on tomorrow 3/15  Call patient wife back per her request

## 2022-04-19 ENCOUNTER — Telehealth: Payer: Self-pay | Admitting: Family Medicine

## 2022-04-19 DIAGNOSIS — E1121 Type 2 diabetes mellitus with diabetic nephropathy: Secondary | ICD-10-CM

## 2022-04-19 NOTE — Telephone Encounter (Signed)
Rx was filled yesterday  Dispense Quantity: 30 tablet Refills: 1        Sig: Take 1 tablet (10 mg total) by mouth daily before breakfast.       Start Date: 04/18/22

## 2022-04-19 NOTE — Telephone Encounter (Signed)
Patients wife advised. Verbalized undestanding

## 2022-04-19 NOTE — Telephone Encounter (Signed)
Pharmacy is calling in because the pt needs his glipiZIDE (GLUCOTROL) 10 MG tablet [id number] refilled because he was told to take 2/day and the supply wasn't big enough. Pharmacy is requesting a new prescription. Pharmacy says pt is out of medication and would like to hear something before the weekend. Please advise.

## 2022-04-22 MED ORDER — GLIPIZIDE 10 MG PO TABS: 10.0000 mg | ORAL_TABLET | Freq: Two times a day (BID) | ORAL | 1 refills | Status: DC

## 2022-04-29 ENCOUNTER — Ambulatory Visit: Payer: Medicare HMO | Admitting: Family Medicine

## 2022-05-03 ENCOUNTER — Encounter: Payer: Self-pay | Admitting: Family Medicine

## 2022-05-03 ENCOUNTER — Ambulatory Visit (INDEPENDENT_AMBULATORY_CARE_PROVIDER_SITE_OTHER): Payer: Medicare HMO | Admitting: Family Medicine

## 2022-05-03 DIAGNOSIS — E1121 Type 2 diabetes mellitus with diabetic nephropathy: Secondary | ICD-10-CM | POA: Diagnosis not present

## 2022-05-03 LAB — POCT GLYCOSYLATED HEMOGLOBIN (HGB A1C)
Est. average glucose Bld gHb Est-mCnc: 255
Hemoglobin A1C: 10.5 % — AB (ref 4.0–5.6)

## 2022-05-03 MED ORDER — PIOGLITAZONE HCL 45 MG PO TABS: 45.0000 mg | ORAL_TABLET | Freq: Every day | ORAL | 1 refills | Status: DC

## 2022-05-03 NOTE — Patient Instructions (Signed)
.   Please review the attached list of medications and notify my office if there are any errors.   . Please bring all of your medications to every appointment so we can make sure that our medication list is the same as yours.   

## 2022-05-03 NOTE — Progress Notes (Signed)
I,Sha'taria Tyson,acting as a Education administrator for Lelon Huh, MD.,have documented all relevant documentation on the behalf of Lelon Huh, MD,as directed by  Lelon Huh, MD while in the presence of Lelon Huh, MD.   Established patient visit   [name]   DOB: 27-Mar-1940   82 y.o. Male  MRN: [id number] Visit Date: 05/03/2022  Today's healthcare provider: Lelon Huh, MD   No chief complaint on file.  Subjective    HPI  Diabetes Mellitus Type II, Follow-up  Lab Results  Component Value Date   HGBA1C 11.4 (A) 03/08/2022   HGBA1C 8.6 (A) 01/14/2022   HGBA1C 8.3 (A) 10/12/2021   Wt Readings from Last 3 Encounters:  03/08/22 172 lb (78 kg)  01/14/22 177 lb (80.3 kg)  12/17/21 180 lb 2 oz (81.7 kg)   Last seen for diabetes 1 months ago.  Management since then includes restart metformin but take 1 tablet daily and continue farxiga 10 mg daily . He reports excellent compliance with treatment. He is not having side effects.  Symptoms: No fatigue No foot ulcerations  No appetite changes No nausea  No paresthesia of the feet  No polydipsia  No polyuria No visual disturbances   No vomiting     Home blood sugar records: fasting range: 134-220  Episodes of hypoglycemia? No    Current insulin regiment: {none  Pertinent Labs: Lab Results  Component Value Date   CHOL 87 (L) 03/08/2022   HDL 45 03/08/2022   LDLCALC 22 03/08/2022   TRIG 108 03/08/2022   CHOLHDL 1.9 03/08/2022   Lab Results  Component Value Date   NA 137 03/08/2022   K 4.9 03/08/2022   CREATININE 2.08 (H) 03/08/2022   EGFR 32 (L) 03/08/2022   MICRALBCREAT 0.521 02/06/2021     ---------------------------------------------------------------------------------------------------   Medications: Outpatient Medications Prior to Visit  Medication Sig   apixaban (ELIQUIS) 5 MG TABS tablet TAKE 1 TABLET BY MOUTH TWICE A DAY   dapagliflozin propanediol (FARXIGA) 10 MG TABS tablet Take 1  tablet (10 mg total) by mouth daily before breakfast.   EPINEPHrine 0.3 mg/0.3 mL IJ SOAJ injection Inject 0.3 mg into the muscle as needed.   ezetimibe (ZETIA) 10 MG tablet TAKE 1 TABLET BY MOUTH EVERY DAY   furosemide (LASIX) 40 MG tablet Take 40 mg by mouth daily.   glipiZIDE (GLUCOTROL) 10 MG tablet Take 1 tablet (10 mg total) by mouth 2 (two) times daily before a meal.   LORazepam (ATIVAN) 1 MG tablet TAKE 1/2 TO 1 TABLET BY MOUTH NIGHTLY AT BEDTIME   metoprolol tartrate (LOPRESSOR) 25 MG tablet TAKE 1 TABLET BY MOUTH TWICE A DAY   ONETOUCH ULTRA test strip USE TO CHECK BLOOD SUGAR DAILY FOR TYPE 2 DIABETES DX: E11.9   pioglitazone (ACTOS) 30 MG tablet TAKE 1 TABLET BY MOUTH EVERY DAY   rosuvastatin (CRESTOR) 40 MG tablet Take 1 tablet (40 mg total) by mouth daily.   No facility-administered medications prior to visit.    Review of Systems  Constitutional:  Negative for appetite change, chills and fever.  Respiratory:  Negative for chest tightness, shortness of breath and wheezing.   Cardiovascular:  Negative for chest pain and palpitations.  Gastrointestinal:  Negative for abdominal pain, nausea and vomiting.       Objective    BP 136/70 (BP Location: Right Arm, Patient Position: Sitting, Cuff Size: Normal)   Pulse 61   Ht 5\' 7"  (1.702 m)   Wt 176  lb 4.8 oz (80 kg)   SpO2 100%   BMI 27.61 kg/m    Physical Exam   General: Appearance:    Well developed, well nourished male in no acute distress  Eyes:    PERRL, conjunctiva/corneas clear, EOM's intact       Lungs:     Clear to auscultation bilaterally, respirations unlabored  Heart:    Normal heart rate. Irregularly irregular rhythm. No murmurs, rubs, or gallops.    MS:   All extremities are intact.    Neurologic:   Awake, alert, oriented x 3. No apparent focal neurological defect.         Results for orders placed or performed in visit on 05/03/22  POCT HgB A1C  Result Value Ref Range   Hemoglobin A1C 10.5 (A) 4.0 -  5.6 %   Est. average glucose Bld gHb Est-mCnc 255     Assessment & Plan     1. 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([place]) Improving with addition of low dose metformin. Dose limited by CKD. Will increase from 15mg  to pioglitazone (ACTOS) 45 MG tablet; Take 1 tablet (45 mg total) by mouth daily.  Dispense: 90 tablet; Refill: 1 (May take 1 1/2 of 30mg  tablets until done)  He has follow up with renal next week for labs.   Follow up here in 2-3 months.       The entirety of the information documented in the History of Present Illness, Review of Systems and Physical Exam were personally obtained by me. Portions of this information were initially documented by the CMA and reviewed by me for thoroughness and accuracy.     Lelon Huh, MD  [place] [phone] (phone) [phone] (fax)  [place]

## 2022-05-07 DIAGNOSIS — N2581 Secondary hyperparathyroidism of renal origin: Secondary | ICD-10-CM | POA: Diagnosis not present

## 2022-05-07 DIAGNOSIS — E1122 Type 2 diabetes mellitus with diabetic chronic kidney disease: Secondary | ICD-10-CM | POA: Diagnosis not present

## 2022-05-07 DIAGNOSIS — R809 Proteinuria, unspecified: Secondary | ICD-10-CM | POA: Diagnosis not present

## 2022-05-07 DIAGNOSIS — I10 Essential (primary) hypertension: Secondary | ICD-10-CM | POA: Diagnosis not present

## 2022-05-07 DIAGNOSIS — E875 Hyperkalemia: Secondary | ICD-10-CM | POA: Diagnosis not present

## 2022-05-07 DIAGNOSIS — N1832 Chronic kidney disease, stage 3b: Secondary | ICD-10-CM | POA: Diagnosis not present

## 2022-05-13 DIAGNOSIS — I10 Essential (primary) hypertension: Secondary | ICD-10-CM | POA: Diagnosis not present

## 2022-05-13 DIAGNOSIS — E875 Hyperkalemia: Secondary | ICD-10-CM | POA: Diagnosis not present

## 2022-05-13 DIAGNOSIS — E1122 Type 2 diabetes mellitus with diabetic chronic kidney disease: Secondary | ICD-10-CM | POA: Diagnosis not present

## 2022-05-28 ENCOUNTER — Telehealth: Payer: Self-pay | Admitting: Family Medicine

## 2022-05-28 ENCOUNTER — Other Ambulatory Visit: Payer: Self-pay | Admitting: Family Medicine

## 2022-05-28 DIAGNOSIS — E1121 Type 2 diabetes mellitus with diabetic nephropathy: Secondary | ICD-10-CM

## 2022-05-28 MED ORDER — DAPAGLIFLOZIN PROPANEDIOL 10 MG PO TABS
10.0000 mg | ORAL_TABLET | Freq: Every day | ORAL | 0 refills | Status: DC
Start: 2022-05-28 — End: 2022-07-30

## 2022-05-28 MED ORDER — APIXABAN 5 MG PO TABS: 5.0000 mg | ORAL_TABLET | Freq: Two times a day (BID) | ORAL | 0 refills | Status: DC

## 2022-05-28 NOTE — Telephone Encounter (Signed)
Patient advised to pick up samples at front desk.

## 2022-05-28 NOTE — Telephone Encounter (Signed)
He can have 4 boxes or eliquis 5 and 4 boxes of Farxiga .

## 2022-06-06 ENCOUNTER — Ambulatory Visit: Payer: Medicare HMO | Admitting: Dermatology

## 2022-06-06 VITALS — BP 138/89 | HR 64

## 2022-06-06 DIAGNOSIS — L814 Other melanin hyperpigmentation: Secondary | ICD-10-CM

## 2022-06-06 DIAGNOSIS — L570 Actinic keratosis: Secondary | ICD-10-CM

## 2022-06-06 DIAGNOSIS — L578 Other skin changes due to chronic exposure to nonionizing radiation: Secondary | ICD-10-CM | POA: Diagnosis not present

## 2022-06-06 DIAGNOSIS — L821 Other seborrheic keratosis: Secondary | ICD-10-CM | POA: Diagnosis not present

## 2022-06-06 DIAGNOSIS — X32XXXA Exposure to sunlight, initial encounter: Secondary | ICD-10-CM | POA: Diagnosis not present

## 2022-06-06 DIAGNOSIS — D692 Other nonthrombocytopenic purpura: Secondary | ICD-10-CM

## 2022-06-06 DIAGNOSIS — W908XXA Exposure to other nonionizing radiation, initial encounter: Secondary | ICD-10-CM | POA: Diagnosis not present

## 2022-06-06 NOTE — Patient Instructions (Addendum)
Cryotherapy Aftercare  Wash gently with soap and water everyday.   Apply Vaseline and Band-Aid daily until healed.     Due to recent changes in healthcare laws, you may see results of your pathology and/or laboratory studies on MyChart before the doctors have had a chance to review them. We understand that in some cases there may be results that are confusing or concerning to you. Please understand that not all results are received at the same time and often the doctors may need to interpret multiple results in order to provide you with the best plan of care or course of treatment. Therefore, we ask that you please give us 2 business days to thoroughly review all your results before contacting the office for clarification. Should we see a critical lab result, you will be contacted sooner.   If You Need Anything After Your Visit  If you have any questions or concerns for your doctor, please call our main line at [phone] and press option 4 to reach your doctor's medical assistant. If no one answers, please leave a voicemail as directed and we will return your call as soon as possible. Messages left after 4 pm will be answered the following business day.   You may also send us a message via MyChart. We typically respond to MyChart messages within 1-2 business days.  For prescription refills, please ask your pharmacy to contact our office. Our fax number is [phone].  If you have an urgent issue when the clinic is closed that cannot wait until the next business day, you can page your doctor at the number below.    Please note that while we do our best to be available for urgent issues outside of office hours, we are not available 24/7.   If you have an urgent issue and are unable to reach us, you may choose to seek medical care at your doctor's office, retail clinic, urgent care center, or emergency room.  If you have a medical emergency, please immediately call 911 or go to the  emergency department.  Pager Numbers  - Dr. Kowalski: [phone]  - Dr. Moye: [phone]  - Dr. Stewart: [phone]  In the event of inclement weather, please call our main line at [phone] for an update on the status of any delays or closures.  Dermatology Medication Tips: Please keep the boxes that topical medications come in in order to help keep track of the instructions about where and how to use these. Pharmacies typically print the medication instructions only on the boxes and not directly on the medication tubes.   If your medication is too expensive, please contact our office at [phone] option 4 or send us a message through MyChart.   We are unable to tell what your co-pay for medications will be in advance as this is different depending on your insurance coverage. However, we may be able to find a substitute medication at lower cost or fill out paperwork to get insurance to cover a needed medication.   If a prior authorization is required to get your medication covered by your insurance company, please allow us 1-2 business days to complete this process.  Drug prices often vary depending on where the prescription is filled and some pharmacies may offer cheaper prices.  The website www.goodrx.com contains coupons for medications through different pharmacies. The prices here do not account for what the cost may be with help from insurance (it may be cheaper with your insurance), but the website can   give you the price if you did not use any insurance.  - You can print the associated coupon and take it with your prescription to the pharmacy.  - You may also stop by our office during regular business hours and pick up a GoodRx coupon card.  - If you need your prescription sent electronically to a different pharmacy, notify our office through [facility] MyChart or by phone at [phone] option 4.     Si Usted Necesita Algo Despus de Su Visita  Tambin puede  enviarnos un mensaje a travs de MyChart. Por lo general respondemos a los mensajes de MyChart en el transcurso de 1 a 2 das hbiles.  Para renovar recetas, por favor pida a su farmacia que se ponga en contacto con nuestra oficina. Nuestro nmero de fax es el [phone].  Si tiene un asunto urgente cuando la clnica est cerrada y que no puede esperar hasta el siguiente da hbil, puede llamar/localizar a su doctor(a) al nmero que aparece a continuacin.   Por favor, tenga en cuenta que aunque hacemos todo lo posible para estar disponibles para asuntos urgentes fuera del horario de oficina, no estamos disponibles las 24 horas del da, los 7 das de la semana.   Si tiene un problema urgente y no puede comunicarse con nosotros, puede optar por buscar atencin mdica  en el consultorio de su doctor(a), en una clnica privada, en un centro de atencin urgente o en una sala de emergencias.  Si tiene una emergencia mdica, por favor llame inmediatamente al 911 o vaya a la sala de emergencias.  Nmeros de bper  - Dr. Kowalski: [phone]  - Dra. Moye: [phone]  - Dra. Stewart: [phone]  En caso de inclemencias del tiempo, por favor llame a nuestra lnea principal al [phone] para una actualizacin sobre el estado de cualquier retraso o cierre.  Consejos para la medicacin en dermatologa: Por favor, guarde las cajas en las que vienen los medicamentos de uso tpico para ayudarle a seguir las instrucciones sobre dnde y cmo usarlos. Las farmacias generalmente imprimen las instrucciones del medicamento slo en las cajas y no directamente en los tubos del medicamento.   Si su medicamento es muy caro, por favor, pngase en contacto con nuestra oficina llamando al [phone] y presione la opcin 4 o envenos un mensaje a travs de MyChart.   No podemos decirle cul ser su copago por los medicamentos por adelantado ya que esto es diferente dependiendo de la cobertura de su seguro.  Sin embargo, es posible que podamos encontrar un medicamento sustituto a menor costo o llenar un formulario para que el seguro cubra el medicamento que se considera necesario.   Si se requiere una autorizacin previa para que su compaa de seguros cubra su medicamento, por favor permtanos de 1 a 2 das hbiles para completar este proceso.  Los precios de los medicamentos varan con frecuencia dependiendo del lugar de dnde se surte la receta y alguna farmacias pueden ofrecer precios ms baratos.  El sitio web www.goodrx.com tiene cupones para medicamentos de diferentes farmacias. Los precios aqu no tienen en cuenta lo que podra costar con la ayuda del seguro (puede ser ms barato con su seguro), pero el sitio web puede darle el precio si no utiliz ningn seguro.  - Puede imprimir el cupn correspondiente y llevarlo con su receta a la farmacia.  - Tambin puede pasar por nuestra oficina durante el horario de atencin regular y recoger una tarjeta de cupones de GoodRx.  -   Si necesita que su receta se enve electrnicamente a una farmacia diferente, informe a nuestra oficina a travs de MyChart de [place] o por telfono llamando al [phone] y presione la opcin 4.  

## 2022-06-06 NOTE — Progress Notes (Signed)
Follow-Up Visit   Subjective  Bobby Thomas is a 82 y.o. male who presents for the following: 3 months f/u on precancers on his scalp treated with LN2 with a fair response. The patient has lesions to be evaluated, some may be new or changing and the patient may have concern these could be cancer.    The following portions of the chart were reviewed this encounter and updated as appropriate: medications, allergies, medical history  Review of Systems:  No other skin or systemic complaints except as noted in HPI or Assessment and Plan.  Objective  Well appearing patient in no apparent distress; mood and affect are within normal limits.  A focused examination was performed of the following areas:face,scalp,arms    Relevant exam findings are noted in the Assessment and Plan.  Scalp x 2 (2) Erythematous thin papules/macules with gritty scale.    Assessment & Plan   Purpura - Chronic; persistent and recurrent.  Treatable, but not curable. - Violaceous macules and patches - Benign - Related to trauma, age, sun damage and/or use of blood thinners, chronic use of topical and/or oral steroids - Observe - Can use OTC arnica containing moisturizer such as Dermend Bruise Formula if desired - Call for worsening or other concerns   AK (actinic keratosis) (2) Scalp x 2  Actinic keratoses are precancerous spots that appear secondary to cumulative UV radiation exposure/sun exposure over time. They are chronic with expected duration over 1 year. A portion of actinic keratoses will progress to squamous cell carcinoma of the skin. It is not possible to reliably predict which spots will progress to skin cancer and so treatment is recommended to prevent development of skin cancer.  Recommend daily broad spectrum sunscreen SPF 30+ to sun-exposed areas, reapply every 2 hours as needed.  Recommend staying in the shade or wearing long sleeves, sun glasses (UVA+UVB protection) and wide brim hats  (4-inch brim around the entire circumference of the hat). Call for new or changing lesions.   Destruction of lesion - Scalp x 2 Complexity: simple   Destruction method: cryotherapy   Informed consent: discussed and consent obtained   Timeout:  patient name, date of birth, surgical site, and procedure verified Lesion destroyed using liquid nitrogen: Yes   Region frozen until ice ball extended beyond lesion: Yes   Outcome: patient tolerated procedure well with no complications   Post-procedure details: wound care instructions given    SEBORRHEIC KERATOSIS - Stuck-on, waxy, tan-brown papules and/or plaques  - Benign-appearing - Discussed benign etiology and prognosis. - Observe - Call for any changes   LENTIGINES Exam: scattered tan macules Due to sun exposure Treatment Plan: Benign-appearing, observe. Recommend daily broad spectrum sunscreen SPF 30+ to sun-exposed areas, reapply every 2 hours as needed.  Call for any changes   ACTINIC DAMAGE - chronic, secondary to cumulative UV radiation exposure/sun exposure over time - diffuse scaly erythematous macules with underlying dyspigmentation - Recommend daily broad spectrum sunscreen SPF 30+ to sun-exposed areas, reapply every 2 hours as needed.  - Recommend staying in the shade or wearing long sleeves, sun glasses (UVA+UVB protection) and wide brim hats (4-inch brim around the entire circumference of the hat). - Call for new or changing lesions.   Return in about 8 months (around 02/06/2023) for Aks .  IAngelique Holm, CMA, am acting as scribe for Armida Sans, MD .   Documentation: I have reviewed the above documentation for accuracy and completeness, and I agree with the above.  Sarina Ser, MD

## 2022-06-12 ENCOUNTER — Encounter: Payer: Self-pay | Admitting: Dermatology

## 2022-07-03 ENCOUNTER — Other Ambulatory Visit: Payer: Self-pay | Admitting: Family Medicine

## 2022-07-03 DIAGNOSIS — E1121 Type 2 diabetes mellitus with diabetic nephropathy: Secondary | ICD-10-CM

## 2022-07-15 ENCOUNTER — Encounter: Payer: Self-pay | Admitting: Family Medicine

## 2022-07-15 ENCOUNTER — Ambulatory Visit (INDEPENDENT_AMBULATORY_CARE_PROVIDER_SITE_OTHER): Payer: Medicare HMO | Admitting: Family Medicine

## 2022-07-15 VITALS — BP 126/64 | HR 65 | Temp 97.5°F | Resp 13 | Ht 67.0 in | Wt 178.4 lb

## 2022-07-15 DIAGNOSIS — E1122 Type 2 diabetes mellitus with diabetic chronic kidney disease: Secondary | ICD-10-CM

## 2022-07-15 LAB — PROTEIN / CREATININE RATIO, URINE
Creat: 22
Prot/Creat Ratio, Ur: 1818
Total Protein, Urine: 50

## 2022-07-15 LAB — POCT GLYCOSYLATED HEMOGLOBIN (HGB A1C)
Est. average glucose Bld gHb Est-mCnc: 226
Hemoglobin A1C: 9.5 % — AB (ref 4.0–5.6)

## 2022-07-15 MED ORDER — JANUMET 50-500 MG PO TABS
1.0000 | ORAL_TABLET | Freq: Two times a day (BID) | ORAL | 1 refills | Status: DC
Start: 2022-07-15 — End: 2022-07-15

## 2022-07-15 MED ORDER — JANUMET 50-500 MG PO TABS
1.0000 | ORAL_TABLET | Freq: Every day | ORAL | 1 refills | Status: DC
Start: 2022-07-15 — End: 2023-02-04

## 2022-07-15 NOTE — Patient Instructions (Signed)
.   Please review the attached list of medications and notify my office if there are any errors.   . Please bring all of your medications to every appointment so we can make sure that our medication list is the same as yours.   

## 2022-07-15 NOTE — Progress Notes (Signed)
I,Vanessa  Vital,acting as a Neurosurgeon for Mila Merry, MD.,have documented all relevant documentation on the behalf of Mila Merry, MD,as directed by  Mila Merry, MD    Established patient visit   [name]   DOB: 1940-04-20   82 y.o. Male  MRN: [id number] Visit Date: 07/15/2022  Today's healthcare provider: Mila Merry, MD   Chief Complaint  Patient presents with   Diabetes   Subjective    HPI  Diabetes Mellitus Type II, Follow-up  Lab Results  Component Value Date   HGBA1C 10.5 (A) 05/03/2022   HGBA1C 11.4 (A) 03/08/2022   HGBA1C 8.6 (A) 01/14/2022   Wt Readings from Last 3 Encounters:  05/03/22 176 lb 4.8 oz (80 kg)  03/08/22 172 lb (78 kg)  01/14/22 177 lb (80.3 kg)   Last seen for diabetes 3 months ago.  Management since then includes increasing pioglitazone 45 mg and increasing glipizide to BID He reports good compliance with treatment. He is not having side effects.  Symptoms: No fatigue No foot ulcerations  No appetite changes No nausea  No paresthesia of the feet  No polydipsia  No polyuria No visual disturbances   No vomiting     Home blood sugar records:  last checked this morning 172  Episodes of hypoglycemia? No      Pertinent Labs: Lab Results  Component Value Date   CHOL 87 (L) 03/08/2022   HDL 45 03/08/2022   LDLCALC 22 03/08/2022   TRIG 108 03/08/2022   CHOLHDL 1.9 03/08/2022   Lab Results  Component Value Date   NA 137 03/08/2022   K 4.9 03/08/2022   CREATININE 2.08 (H) 03/08/2022   EGFR 32 (L) 03/08/2022   MICRALBCREAT 0.521 02/06/2021     ---------------------------------------------------------------------------------------------------   Medications: Outpatient Medications Prior to Visit  Medication Sig   apixaban (ELIQUIS) 5 MG TABS tablet Take 1 tablet (5 mg total) by mouth 2 (two) times daily.   dapagliflozin propanediol (FARXIGA) 10 MG TABS tablet Take 1 tablet (10 mg total) by mouth daily  before breakfast.   EPINEPHrine 0.3 mg/0.3 mL IJ SOAJ injection Inject 0.3 mg into the muscle as needed.   ezetimibe (ZETIA) 10 MG tablet TAKE 1 TABLET BY MOUTH EVERY DAY   furosemide (LASIX) 40 MG tablet Take 40 mg by mouth daily.   glipiZIDE (GLUCOTROL) 10 MG tablet TAKE 1 TABLET BY MOUTH EVERY DAY BEFORE BREAKFAST   LORazepam (ATIVAN) 1 MG tablet TAKE 1/2 TO 1 TABLET BY MOUTH NIGHTLY AT BEDTIME   metFORMIN (GLUCOPHAGE) 850 MG tablet SMARTSIG:1 Tablet(s) By Mouth Every Evening   metoprolol tartrate (LOPRESSOR) 25 MG tablet TAKE 1 TABLET BY MOUTH TWICE A DAY   ONETOUCH ULTRA test strip USE TO CHECK BLOOD SUGAR DAILY FOR TYPE 2 DIABETES DX: E11.9   pioglitazone (ACTOS) 45 MG tablet Take 1 tablet (45 mg total) by mouth daily.   rosuvastatin (CRESTOR) 40 MG tablet Take 1 tablet (40 mg total) by mouth daily.   No facility-administered medications prior to visit.    Review of Systems     Objective    BP 126/64 (BP Location: Right Arm, Patient Position: Sitting, Cuff Size: Normal)   Pulse 65   Temp (!) 97.5 F (36.4 C) (Oral)   Resp 13   Ht 5\' 7"  (1.702 m)   Wt 178 lb 6.4 oz (80.9 kg)   SpO2 99%   BMI 27.94 kg/m    Physical Exam   General: Appearance:  Well developed, well nourished male in no acute distress  Eyes:    PERRL, conjunctiva/corneas clear, EOM's intact       Lungs:     Clear to auscultation bilaterally, respirations unlabored  Heart:    Normal heart rate. Irregularly irregular rhythm. No murmurs, rubs, or gallops.    MS:   All extremities are intact.    Neurologic:   Awake, alert, oriented x 3. No apparent focal neurological defect.         Results for orders placed or performed in visit on 07/15/22  POCT glycosylated hemoglobin (Hb A1C)  Result Value Ref Range   Hemoglobin A1C 9.5 (A) 4.0 - 5.6 %   Est. average glucose Bld gHb Est-mCnc 226     Assessment & Plan     1. Type 2 diabetes mellitus with chronic kidney disease, without long-term current use of  insulin, unspecified CKD stage (HCC) A bit better with increased dose of pioglitazone and doubled dose of glipizide. Will change metformin to Janumet 50/500 once a day.  Follow up 3 months.           Mila Merry, MD  [facility] [phone] (phone) [phone] (fax)  [facility]

## 2022-07-27 ENCOUNTER — Other Ambulatory Visit: Payer: Self-pay | Admitting: Family Medicine

## 2022-07-27 DIAGNOSIS — E1121 Type 2 diabetes mellitus with diabetic nephropathy: Secondary | ICD-10-CM

## 2022-07-29 NOTE — Telephone Encounter (Signed)
Requested medication (s) are due for refill today: Yes  Requested medication (s) are on the active medication list: Yes  Last refill:  11/04/21  Future visit scheduled: Yes  Notes to clinic:  Not delegated.    Requested Prescriptions  Pending Prescriptions Disp Refills   LORazepam (ATIVAN) 1 MG tablet [Pharmacy Med Name: LORAZEPAM 1 MG TABLET] 90 tablet     Sig: TAKE 1/2 TO 1 TABLET BY MOUTH NIGHTLY AT BEDTIME     Not Delegated - Psychiatry: Anxiolytics/Hypnotics 2 Failed - 07/27/2022  7:06 PM      Failed - This refill cannot be delegated      Failed - Urine Drug Screen completed in last 360 days      Passed - Patient is not pregnant      Passed - Valid encounter within last 6 months    Recent Outpatient Visits           2 weeks ago Type 2 diabetes mellitus with chronic kidney disease, without long-term current use of insulin, unspecified CKD stage (HCC)   [facility] Malva Limes, MD   2 months 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[facility])   [place] [facility] Malva Limes, MD   4 months 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[facility])   [place] [facility] Malva Limes, MD   6 months 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[facility])   [place] [facility] Malva Limes, MD   9 months 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[facility])   [place] [facility] Malva Limes, MD       Future Appointments             In 2 months Fisher, Demetrios Isaacs, MD [facility]   In 6 months Deirdre Evener, MD [facility]             FARXIGA 5 MG TABS tablet [Pharmacy Med Name: FARXIGA 5 MG TABLET] 30 tablet 2    Sig: TAKE 1 TABLET BY MOUTH DAILY BEFORE BREAKFAST.     Endocrinology:  Diabetes - SGLT2 Inhibitors Failed - 07/27/2022  7:06 PM      Failed - HBA1C is between 0 and 7.9 and within 180 days    Hemoglobin A1C  Date  Value Ref Range Status  07/15/2022 9.5 (A) 4.0 - 5.6 % Final   Hgb A1c MFr Bld  Date Value Ref Range Status  05/10/2021 8.2 (H) 4.8 - 5.6 % Final    Comment:             Prediabetes: 5.7 - 6.4          Diabetes: >6.4          Glycemic control for adults with diabetes: <7.0          Failed - eGFR in normal range and within 360 days    EGFR (African American)  Date Value Ref Range Status  05/20/2012 >60  Final   GFR calc Af Amer  Date Value Ref Range Status  07/09/2019 36 (L) >59 mL/min/1.73 Final    Comment:    **[facility] currently reports eGFR in compliance with the current**   recommendations of the [employer]. [facility] will   update reporting as new guidelines are published from the NKF-ASN   Task force.    EGFR (Non-African Amer.)  Date Value Ref Range Status  05/20/2012 59 (L)  Final  Comment:    eGFR values <34mL/min/1.73 m2 may be an indication of chronic kidney disease (CKD). Calculated eGFR is useful in patients with stable renal function. The eGFR calculation will not be reliable in acutely ill patients when serum creatinine is changing rapidly. It is not useful in  patients on dialysis. The eGFR calculation may not be applicable to patients at the low and high extremes of body sizes, pregnant women, and vegetarians.    GFR calc non Af Amer  Date Value Ref Range Status  07/09/2019 31 (L) >59 mL/min/1.73 Final   eGFR  Date Value Ref Range Status  03/08/2022 32 (L) >59 mL/min/1.73 Final         Passed - Cr in normal range and within 360 days    Creat  Date Value Ref Range Status  05/07/2022 22 mg/dL  Final   Creatinine, POC  Date Value Ref Range Status  11/02/2018 n/a mg/dL Final         Passed - Valid encounter within last 6 months    Recent Outpatient Visits           2 weeks ago Type 2 diabetes mellitus with chronic kidney disease, without long-term current use of insulin, unspecified CKD stage (HCC)   [facility] Malva Limes, MD   2 months 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[facility])   [place] [facility] Malva Limes, MD   4 months 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[facility])   [place] [facility] Malva Limes, MD   6 months 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[facility])   [place] [facility] Malva Limes, MD   9 months 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[facility])   [place] [facility] Malva Limes, MD       Future Appointments             In 2 months Fisher, Demetrios Isaacs, MD [facility]   In 6 months Deirdre Evener, MD [facility]

## 2022-08-01 ENCOUNTER — Telehealth: Payer: Self-pay

## 2022-08-01 DIAGNOSIS — E1121 Type 2 diabetes mellitus with diabetic nephropathy: Secondary | ICD-10-CM

## 2022-08-01 NOTE — Telephone Encounter (Signed)
Copied from CRM [id number]. Topic: General - Inquiry >> Aug 01, 2022 10:30 AM Haroldine Laws wrote: Reason for CRM: pt's wife wants to know if her husband can not take the Janumet that was recently prescribed and just increase his other diabetic medications.  She said it was too expensive.  CB#  [phone]

## 2022-08-05 NOTE — Addendum Note (Signed)
Addended by: Malva Limes on: 08/05/2022 01:14 PM   Modules accepted: Orders

## 2022-08-05 NOTE — Telephone Encounter (Signed)
Have placed referral for CCM pharmacy team to help

## 2022-08-12 DIAGNOSIS — E1122 Type 2 diabetes mellitus with diabetic chronic kidney disease: Secondary | ICD-10-CM | POA: Diagnosis not present

## 2022-08-12 DIAGNOSIS — I10 Essential (primary) hypertension: Secondary | ICD-10-CM | POA: Diagnosis not present

## 2022-08-12 DIAGNOSIS — E875 Hyperkalemia: Secondary | ICD-10-CM | POA: Diagnosis not present

## 2022-08-14 ENCOUNTER — Telehealth: Payer: Self-pay

## 2022-08-14 NOTE — Progress Notes (Signed)
   Care Guide Note  08/14/2022 Name: TOMER CHALMERS MRN: [id number] DOB: 16-Jun-1940  Referred by: Malva Limes, MD Reason for referral : Care Coordination (Outreach to schedule with Pharm d )   NETTIE CROMWELL is a 82 y.o. year old male who is a primary care patient of Sherrie Mustache, Demetrios Isaacs, MD. Vanetta Mulders was referred to the pharmacist for assistance related to HTN and HLD.    An unsuccessful telephone outreach was attempted today to contact the patient who was referred to the pharmacy team for assistance with medication management. Additional attempts will be made to contact the patient.   Penne Lash, RMA Care Guide [facility]  [place], [place] [address] Direct Dial: [phone] [email or web address]

## 2022-08-15 DIAGNOSIS — N1832 Chronic kidney disease, stage 3b: Secondary | ICD-10-CM | POA: Diagnosis not present

## 2022-08-15 DIAGNOSIS — I129 Hypertensive chronic kidney disease with stage 1 through stage 4 chronic kidney disease, or unspecified chronic kidney disease: Secondary | ICD-10-CM | POA: Diagnosis not present

## 2022-08-15 DIAGNOSIS — E1122 Type 2 diabetes mellitus with diabetic chronic kidney disease: Secondary | ICD-10-CM | POA: Diagnosis not present

## 2022-08-15 DIAGNOSIS — N189 Chronic kidney disease, unspecified: Secondary | ICD-10-CM

## 2022-08-15 DIAGNOSIS — R809 Proteinuria, unspecified: Secondary | ICD-10-CM | POA: Diagnosis not present

## 2022-08-15 DIAGNOSIS — N2581 Secondary hyperparathyroidism of renal origin: Secondary | ICD-10-CM | POA: Diagnosis not present

## 2022-08-15 DIAGNOSIS — E875 Hyperkalemia: Secondary | ICD-10-CM | POA: Diagnosis not present

## 2022-08-15 HISTORY — DX: Chronic kidney disease, unspecified: N18.9

## 2022-08-15 NOTE — Progress Notes (Signed)
   Care Guide Note  08/15/2022 Name: Bobby Thomas MRN: [id number] DOB: [birth year]  Referred by: Malva Limes, MD Reason for referral : Care Coordination (Outreach to schedule with Pharm d )   Bobby Thomas is a 82 y.o. year old male who is a primary care patient of Sherrie Mustache, Demetrios Isaacs, MD. Bobby Thomas was referred to the pharmacist for assistance related to DM.    Successful contact was made with the patient to discuss pharmacy services including being ready for the pharmacist to call at least 5 minutes before the scheduled appointment time, to have medication bottles and any blood sugar or blood pressure readings ready for review. The patient agreed to meet with the pharmacist via with the pharmacist via telephone visit on (date/time).  08/22/2022  Penne Lash, RMA Care Guide [facility]  [place], [place] [address] Direct Dial: [phone] [email or web address]

## 2022-08-21 ENCOUNTER — Other Ambulatory Visit: Payer: Self-pay | Admitting: Cardiovascular Disease

## 2022-08-21 NOTE — Telephone Encounter (Signed)
Prescription refill request for Eliquis received. Indication: AF Last office visit: 12/17/21  Concha Se MD Scr: 2.20 on 08/12/22  Epic Age: 82 Weight: 81.7kg  Based on above findings Eliquis 5mg  twice daily is the appropriate dose.  Dose will need to be decreased after his birthday.  Refill approved x 1 only.

## 2022-08-22 ENCOUNTER — Other Ambulatory Visit: Payer: Medicare HMO | Admitting: Pharmacist

## 2022-08-22 ENCOUNTER — Telehealth: Payer: Self-pay | Admitting: Pharmacist

## 2022-08-22 NOTE — Telephone Encounter (Signed)
First unsuccessful outreach to patient. LM on VM at patient's home with my CB#[phone].  Also try to call wife's phone and no answer. No VM set up.   Patient was referred to Clinical Pharmacist Practitioner for medication cost assistance.

## 2022-08-22 NOTE — Progress Notes (Signed)
08/22/2022 Name: Bobby Thomas MRN: [id number] DOB: 1940/09/15  Chief Complaint  Patient presents with   Medication Management   Diabetes    initial    Bobby Thomas is a 82 y.o. year old male who presented for a telephone visit.   They were referred to the pharmacist by their PCP for assistance in managing diabetes and medication access.    Subjective:  Care Team: Primary Care Provider: Malva Limes, MD ; Next Scheduled Visit: 10/18/2022 Nephrologist - Dr Mila Merry at [facility]  Medication Access/Adherence  Current Pharmacy:  [facility] [address], [place] - [address] [place] [address] Phone: [phone] Fax: [phone]   Patient reports affordability concerns with their medications: Yes  Patient reports access/transportation concerns to their pharmacy: No  Patient reports adherence concerns with their medications:  Yes  hasn't started JanuMet due to cost.   Patient is currently taking several brand medications: Eliquis, Marcelline Deist and was just started on Janumet   Diabetes with CKD 3b: Seeing Dr Wynelle Link for CKD - Over the last year eGR has ranged from 25 to 36 mL/min  Current medications:  Farxiga 10mg  daily, glipizide 10mg  twice a day,  Metformin was just changed to Janumet 50/500mg  - take 1 tablet daily but patient has not started yet - concerned about the cost of medications. Patient did pick up 30 days supply but has not started yet.   Current glucose readings: 178, 222, 199, 204, 207, 200, 174 Checking blood glucose once a day  Patient denies hypoglycemic s/sx including no dizziness, shakiness, sweating. Patient denies hyperglycemic symptoms including no polyuria, polydipsia, polyphagia, nocturia, neuropathy, blurred vision.  Current medication access support: none  Hyperlipidemia/ASCVD Risk Reduction  Current lipid lowering medications: rosuvastatin 40mg  daily and ezetimibe 10mg  daily  Medications  tried in the past: simvastatin  Antiplatelet regimen: on Eliquis 5mg  twice a day for afib  ASCVD History: CAD  Current medication access support: none   Objective:  Lab Results  Component Value Date   HGBA1C 9.5 (A) 07/15/2022    Lab Results  Component Value Date   CREATININE 22 mg/dL 69/62/9528   BUN 53 (H) 03/08/2022   NA 137 03/08/2022   K 4.9 03/08/2022   CL 95 (L) 03/08/2022   CO2 21 03/08/2022    Lab Results  Component Value Date   CHOL 87 (L) 03/08/2022   HDL 45 03/08/2022   LDLCALC 22 03/08/2022   TRIG 108 03/08/2022   CHOLHDL 1.9 03/08/2022    Medications Reviewed Today     Reviewed by Henrene Pastor, RPH-CPP (Pharmacist) on 08/22/22 at 1621  Med List Status: <None>   Medication Order Taking? Sig Documenting Provider Last Dose Status Informant  apixaban (ELIQUIS) 5 MG TABS tablet [id number] Yes TAKE 1 TABLET BY MOUTH TWICE A DAY Gollan, Tollie Pizza, MD Taking Active   dapagliflozin propanediol (FARXIGA) 10 MG TABS tablet [id number] Yes Take 1 tablet (10 mg total) by mouth daily before breakfast. Erasmo Downer, MD Taking Active   EPINEPHrine 0.3 mg/0.3 mL IJ SOAJ injection [id number] Yes Inject 0.3 mg into the muscle as needed. Malva Limes, MD Taking Active   ezetimibe (ZETIA) 10 MG tablet [id number] Yes TAKE 1 TABLET BY MOUTH EVERY DAY Gollan, Tollie Pizza, MD Taking Active   furosemide (LASIX) 40 MG tablet [id number]  Take 40 mg by mouth daily. [provider]  Expired 01/14/22 2359   glipiZIDE (GLUCOTROL) 10 MG tablet [id number] Yes  TAKE 1 TABLET BY MOUTH EVERY DAY BEFORE BREAKFAST  Patient taking differently: Take 10 mg by mouth 2 (two) times daily before a meal.   Malva Limes, MD Taking Active   LORazepam (ATIVAN) 1 MG tablet [id number] Yes TAKE 1/2 TO 1 TABLET BY MOUTH NIGHTLY AT BEDTIME Erasmo Downer, MD Taking Active   metoprolol tartrate (LOPRESSOR) 25 MG tablet [id number] Yes TAKE 1 TABLET BY MOUTH TWICE A DAY Malva Limes, MD  Taking Active   [facility] ULTRA test strip [id number] Yes USE TO CHECK BLOOD SUGAR DAILY FOR TYPE 2 DIABETES DX: E11.9 Malva Limes, MD Taking Active   pioglitazone (ACTOS) 45 MG tablet [id number] Yes Take 1 tablet (45 mg total) by mouth daily. Malva Limes, MD Taking Active   rosuvastatin (CRESTOR) 40 MG tablet [id number] Yes Take 1 tablet (40 mg total) by mouth daily. Antonieta Iba, MD Taking Active   sitaGLIPtin-metformin (JANUMET) 50-500 MG tablet [id number]  Take 1 tablet by mouth daily at 6 (six) AM. TAKE IN PLACE OF METFORMIN Malva Limes, MD  Active               Assessment/Plan:  Medication management - A1c not at goal + CKD 3b Monitor rosuvastatin adherence - current PDC is 84% for 2024. Patient needs assistance with cost of brand medications espeially when in coverage gap. Approved to get Marcelline Deist form [place] and [place] Program - Will request Rx for Marcelline Deist to be sent to [facility].  Wife states if she doesn't have to pay for Marcelline Deist, then they can afford Janumet. However with current Scr / eGFR - Tradjenta might be a better option since it does not need to be adjusted for renal function.  Consider either Tradjenta 5mg  daily or combo Jentadueto (Tradjenta + metformin) -  comes as 2.5/500mg  or 5/1000mg  tablets.  Monitor Scr / eGFR closely with metformin - usually recommended to stop if eGFR < 30 though some propose less restrictive metformin dose of 500mg  per day  if renal function is stable.     Henrene Pastor, PharmD Clinical Pharmacist [facility]  Population Health [phone]

## 2022-08-28 NOTE — Telephone Encounter (Signed)
See phone visit note from 08/22/2022 - patient called back and assessment completed.

## 2022-08-29 MED ORDER — DAPAGLIFLOZIN PROPANEDIOL 10 MG PO TABS
10.0000 mg | ORAL_TABLET | Freq: Every day | ORAL | 1 refills | Status: DC
Start: 2022-08-29 — End: 2023-01-01

## 2022-09-03 ENCOUNTER — Other Ambulatory Visit: Payer: Self-pay | Admitting: Family Medicine

## 2022-09-03 DIAGNOSIS — E1121 Type 2 diabetes mellitus with diabetic nephropathy: Secondary | ICD-10-CM

## 2022-09-09 ENCOUNTER — Ambulatory Visit (INDEPENDENT_AMBULATORY_CARE_PROVIDER_SITE_OTHER): Payer: Medicare HMO

## 2022-09-09 VITALS — Ht 67.0 in | Wt 178.0 lb

## 2022-09-09 DIAGNOSIS — Z0000 Encounter for general adult medical examination without abnormal findings: Secondary | ICD-10-CM | POA: Diagnosis not present
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
## 2022-09-09 NOTE — Patient Instructions (Addendum)
[name] , Thank you for taking time to come for your Medicare Wellness Visit. I appreciate your ongoing commitment to your health goals. Please review the following plan we discussed and let me know if I can assist you in the future.   Referrals/Orders/Follow-Ups/Clinician Recommendations: none  This is a list of the screening recommended for you and due dates:  Health Maintenance  Topic Date Due   Complete foot exam   Never done   Screening for Lung Cancer  Never done   Colon Cancer Screening  10/03/2019   DTaP/Tdap/Td vaccine (2 - Td or Tdap) 05/12/2021   COVID-19 Vaccine (6 - 2023-24 season) 10/05/2021   Flu Shot  09/05/2022   Eye exam for diabetics  10/12/2022   Hemoglobin A1C  01/14/2023   Yearly kidney function blood test for diabetes  03/09/2023   Yearly kidney health urinalysis for diabetes  05/07/2023   Medicare Annual Wellness Visit  09/09/2023   Pneumonia Vaccine  Completed   Zoster (Shingles) Vaccine  Completed   HPV Vaccine  Aged Out    Advanced directives: (Copy Requested) Please bring a copy of your health care power of attorney and living will to the office to be added to your chart at your convenience.  Next Medicare Annual Wellness Visit scheduled for next year: Yes 09/15/23 @10 :45am telephone  Preventive Care 65 Years and Older, Male  Preventive care refers to lifestyle choices and visits with your health care provider that can promote health and wellness. What does preventive care include? A yearly physical exam. This is also called an annual well check. Dental exams once or twice a year. Routine eye exams. Ask your health care provider how often you should have your eyes checked. Personal lifestyle choices, including: Daily care of your teeth and gums. Regular physical activity. Eating a healthy diet. Avoiding tobacco and drug use. Limiting alcohol use. Practicing safe sex. Taking low doses of aspirin every day. Taking vitamin and mineral supplements  as recommended by your health care provider. What happens during an annual well check? The services and screenings done by your health care provider during your annual well check will depend on your age, overall health, lifestyle risk factors, and family history of disease. Counseling  Your health care provider may ask you questions about your: Alcohol use. Tobacco use. Drug use. Emotional well-being. Home and relationship well-being. Sexual activity. Eating habits. History of falls. Memory and ability to understand (cognition). Work and work Astronomer. Screening  You may have the following tests or measurements: Height, weight, and BMI. Blood pressure. Lipid and cholesterol levels. These may be checked every 5 years, or more frequently if you are over 59 years old. Skin check. Lung cancer screening. You may have this screening every year starting at age 50 if you have a 30-pack-year history of smoking and currently smoke or have quit within the past 15 years. Fecal occult blood test (FOBT) of the stool. You may have this test every year starting at age 83. Flexible sigmoidoscopy or colonoscopy. You may have a sigmoidoscopy every 5 years or a colonoscopy every 10 years starting at age 33. Prostate cancer screening. Recommendations will vary depending on your family history and other risks. Hepatitis C blood test. Hepatitis B blood test. Sexually transmitted disease (STD) testing. Diabetes screening. This is done by checking your blood sugar (glucose) after you have not eaten for a while (fasting). You may have this done every 1-3 years. Abdominal aortic aneurysm (AAA) screening. You may need  this if you are a current or former smoker. Osteoporosis. You may be screened starting at age 52 if you are at high risk. Talk with your health care provider about your test results, treatment options, and if necessary, the need for more tests. Vaccines  Your health care provider may recommend  certain vaccines, such as: Influenza vaccine. This is recommended every year. Tetanus, diphtheria, and acellular pertussis (Tdap, Td) vaccine. You may need a Td booster every 10 years. Zoster vaccine. You may need this after age 27. Pneumococcal 13-valent conjugate (PCV13) vaccine. One dose is recommended after age 83. Pneumococcal polysaccharide (PPSV23) vaccine. One dose is recommended after age 31. Talk to your health care provider about which screenings and vaccines you need and how often you need them. This information is not intended to replace advice given to you by your health care provider. Make sure you discuss any questions you have with your health care provider. Document Released: 02/17/2015 Document Revised: 10/11/2015 Document Reviewed: 11/22/2014 Elsevier Interactive Patient Education  2017 [employer].  Fall Prevention in the Home Falls can cause injuries. They can happen to people of all ages. There are many things you can do to make your home safe and to help prevent falls. What can I do on the outside of my home? Regularly fix the edges of walkways and driveways and fix any cracks. Remove anything that might make you trip as you walk through a door, such as a raised step or threshold. Trim any bushes or trees on the path to your home. Use bright outdoor lighting. Clear any walking paths of anything that might make someone trip, such as rocks or tools. Regularly check to see if handrails are loose or broken. Make sure that both sides of any steps have handrails. Any raised decks and porches should have guardrails on the edges. Have any leaves, snow, or ice cleared regularly. Use sand or salt on walking paths during winter. Clean up any spills in your garage right away. This includes oil or grease spills. What can I do in the bathroom? Use night lights. Install grab bars by the toilet and in the tub and shower. Do not use towel bars as grab bars. Use non-skid mats or  decals in the tub or shower. If you need to sit down in the shower, use a plastic, non-slip stool. Keep the floor dry. Clean up any water that spills on the floor as soon as it happens. Remove soap buildup in the tub or shower regularly. Attach bath mats securely with double-sided non-slip rug tape. Do not have throw rugs and other things on the floor that can make you trip. What can I do in the bedroom? Use night lights. Make sure that you have a light by your bed that is easy to reach. Do not use any sheets or blankets that are too big for your bed. They should not hang down onto the floor. Have a firm chair that has side arms. You can use this for support while you get dressed. Do not have throw rugs and other things on the floor that can make you trip. What can I do in the kitchen? Clean up any spills right away. Avoid walking on wet floors. Keep items that you use a lot in easy-to-reach places. If you need to reach something above you, use a strong step stool that has a grab bar. Keep electrical cords out of the way. Do not use floor polish or wax that makes floors  slippery. If you must use wax, use non-skid floor wax. Do not have throw rugs and other things on the floor that can make you trip. What can I do with my stairs? Do not leave any items on the stairs. Make sure that there are handrails on both sides of the stairs and use them. Fix handrails that are broken or loose. Make sure that handrails are as long as the stairways. Check any carpeting to make sure that it is firmly attached to the stairs. Fix any carpet that is loose or worn. Avoid having throw rugs at the top or bottom of the stairs. If you do have throw rugs, attach them to the floor with carpet tape. Make sure that you have a light switch at the top of the stairs and the bottom of the stairs. If you do not have them, ask someone to add them for you. What else can I do to help prevent falls? Wear shoes that: Do not  have high heels. Have rubber bottoms. Are comfortable and fit you well. Are closed at the toe. Do not wear sandals. If you use a stepladder: Make sure that it is fully opened. Do not climb a closed stepladder. Make sure that both sides of the stepladder are locked into place. Ask someone to hold it for you, if possible. Clearly mark and make sure that you can see: Any grab bars or handrails. First and last steps. Where the edge of each step is. Use tools that help you move around (mobility aids) if they are needed. These include: Canes. Walkers. Scooters. Crutches. Turn on the lights when you go into a dark area. Replace any light bulbs as soon as they burn out. Set up your furniture so you have a clear path. Avoid moving your furniture around. If any of your floors are uneven, fix them. If there are any pets around you, be aware of where they are. Review your medicines with your doctor. Some medicines can make you feel dizzy. This can increase your chance of falling. Ask your doctor what other things that you can do to help prevent falls. This information is not intended to replace advice given to you by your health care provider. Make sure you discuss any questions you have with your health care provider. Document Released: 11/17/2008 Document Revised: 06/29/2015 Document Reviewed: 02/25/2014 Elsevier Interactive Patient Education  2017 [employer].

## 2022-09-13 ENCOUNTER — Telehealth: Payer: Self-pay | Admitting: Pharmacist

## 2022-09-13 NOTE — Progress Notes (Signed)
   Outreach Note  09/13/2022 Name: QAIS WESEMANN MRN: [id number] DOB: May 29, 1940  Referred by: Malva Limes, MD  Patient previously spoke with Pharmacist Henrene Pastor on 08/22/2022. Outreach to patient by telephone today to follow up.  Was unable to reach patient via telephone today and have left HIPAA compliant voicemail asking patient to return my call.   Follow Up Plan: Will collaborate with Care Guide to outreach to schedule follow up with me  Estelle Grumbles, PharmD, [facility] Clinical Pharmacist [facility] [phone]

## 2022-09-16 ENCOUNTER — Other Ambulatory Visit: Payer: Self-pay | Admitting: Cardiovascular Disease

## 2022-09-16 ENCOUNTER — Other Ambulatory Visit: Payer: Medicare HMO | Admitting: Pharmacist

## 2022-09-16 NOTE — Telephone Encounter (Signed)
Refill request

## 2022-09-16 NOTE — Progress Notes (Signed)
09/16/2022 Name: Bobby Thomas MRN: [id number] DOB: 21-Nov-1940  Chief Complaint  Patient presents with   Medication Assistance   Medication Management    Bobby Thomas is a 82 y.o. year old male who presented for a telephone visit.   They were referred to the pharmacist by their PCP for assistance in managing diabetes and medication access.    Subjective:  Care Team: Primary Care Provider: Malva Limes, MD ; Next Scheduled Visit: 10/18/2022 Nephrologist: Lamont Dowdy, MD; Next Scheduled Visit: 02/20/2023 Cardiologist: Antonieta Iba, MD  Medication Access/Adherence  [facility]  [facility] [address] [place] - [address] [place] [address] Phone: [phone] Fax: [phone]  [facility] - [place], [place] - [address] [place] [address] Phone: [phone] Fax: [phone]   Patient reports affordability concerns with their medications: Yes  Patient reports access/transportation concerns to their pharmacy: No  Patient reports adherence concerns with their medications:  No    Weekly pillbox as filled by spouse. Reports rarely misses doses  Diabetes:  Current medications:  - Farxiga 10 mg daily - glipizide 10 mg twice daily before breakfast and supper (30 minutes prior) - pioglitazone 45 mg daily - metformin 850 mg daily at bedtime Reports has not yet switched to Janumet 50/500 mg daily, as prescribed by PCP on 07/15/2022, as was first finishing up supply of metformin Rx, but reports has picked up 60 day supply of the Janumet 50/500 mg from pharmacy  Previous therapies tried/barriers: patient prefers to avoid injectable medicaitons  Current glucose readings: morning fasting readings ranging 149-212   Patient denies hypoglycemic s/sx including dizziness, shakiness, sweating.   Current meal patterns:  - Breakfast: sausage with 2 eggs and a biscuit (dinner roll size) - Lunch: soup or  sandwich or oven pizza - Supper: piece of meat and 2 vegetables (green beans, peas) - Snacks: bedtime snack: 1/2 peanut butter sandwich; sugar-free oatmeal with raisins cookie - Drinks: water and coffee with cream  Current physical activity: recently movement limit by outdoor temperatures, limited to movement around house and yard work  Statin therapy: rosuvastatin 40 mg daily + ezetimibe 10 mg daily  Current medication access support: enrolled in patient assistance for Farxiga from [place] through 02/04/2023   Objective:  Lab Results  Component Value Date   HGBA1C 9.5 (A) 07/15/2022   Lab Results  Component Value Date   NA 137 03/08/2022   CL 95 (L) 03/08/2022   K 4.9 03/08/2022   CO2 21 03/08/2022   BUN 53 (H) 03/08/2022   CREATININE 22 mg/dL 76/19/5093   EGFR 32 (L) 03/08/2022   CALCIUM 10.0 03/08/2022   PHOS 4.4 (H) 03/08/2022   ALBUMIN 4.3 03/08/2022   GLUCOSE 287 (H) 03/08/2022  From review of shared record with [facility], note Creatinine 2.20 mg/dL and eGFR 30 on 03/12/7122  Lab Results  Component Value Date   CHOL 87 (L) 03/08/2022   HDL 45 03/08/2022   LDLCALC 22 03/08/2022   TRIG 108 03/08/2022   CHOLHDL 1.9 03/08/2022   BP Readings from Last 3 Encounters:  07/15/22 126/64  06/06/22 138/89  05/03/22 136/70   Pulse Readings from Last 3 Encounters:  07/15/22 65  06/06/22 64  05/03/22 61     Medications Reviewed Today     Reviewed by Manuela Neptune, RPH-CPP (Pharmacist) on 09/16/22 at 1009  Med List Status: <None>   Medication Order Taking?  Sig Documenting Provider Last Dose Status Informant  apixaban (ELIQUIS) 5 MG TABS tablet [id number] Yes TAKE 1 TABLET BY MOUTH TWICE A DAY Gollan, Tollie Pizza, MD Taking Active   dapagliflozin propanediol (FARXIGA) 10 MG TABS tablet [id number] Yes Take 1 tablet (10 mg total) by mouth daily before breakfast. For [place] and [place] medication assistance program. Malva Limes, MD Taking Active   EPINEPHrine 0.3  mg/0.3 mL IJ SOAJ injection [id number]  Inject 0.3 mg into the muscle as needed. Malva Limes, MD  Active   ezetimibe (ZETIA) 10 MG tablet [id number] Yes TAKE 1 TABLET BY MOUTH EVERY DAY Gollan, Tollie Pizza, MD Taking Active   furosemide (LASIX) 40 MG tablet [id number] Yes Take 40 mg by mouth daily. [provider] Taking Active   glipiZIDE (GLUCOTROL) 10 MG tablet [id number] Yes Take 1 tablet (10 mg total) by mouth 2 (two) times daily before a meal. Malva Limes, MD Taking Active   LORazepam (ATIVAN) 1 MG tablet [id number] Yes TAKE 1/2 TO 1 TABLET BY MOUTH NIGHTLY AT BEDTIME Erasmo Downer, MD Taking Active   metoprolol tartrate (LOPRESSOR) 25 MG tablet [id number] Yes TAKE 1 TABLET BY MOUTH TWICE A DAY Malva Limes, MD Taking Active   [facility] ULTRA test strip [id number]  USE TO CHECK BLOOD SUGAR DAILY FOR TYPE 2 DIABETES DX: E11.9 Malva Limes, MD  Active   pioglitazone (ACTOS) 45 MG tablet [id number] Yes TAKE 1 TABLET BY MOUTH EVERY DAY Malva Limes, MD Taking Active   rosuvastatin (CRESTOR) 40 MG tablet [id number] Yes Take 1 tablet (40 mg total) by mouth daily. Antonieta Iba, MD Taking Active   sitaGLIPtin-metformin (JANUMET) 50-500 MG tablet [id number]  Take 1 tablet by mouth daily at 6 (six) AM. TAKE IN PLACE OF METFORMIN Fisher, Demetrios Isaacs, MD  Active               Assessment/Plan:   Patient to follow up with pharmacy to determine current total amount spent out of pocket on prescriptions for household for calendar year. If has spent 3% out of pocket, patient to follow up with clinical pharmacist and will proceed with application for Eliquis patient assistance for current calendar year.    Diabetes: - Currently uncontrolled - Reviewed long term cardiovascular and renal outcomes of uncontrolled blood sugar - Reviewed goal A1c, goal fasting, and goal 2 hour post prandial glucose - Reviewed dietary modifications including: Have regular well-balanced meals and  snacks spread throughout the day, while controlling portion sizes of carbohydrates  Encourage to continue to review nutrition labels for total carbohydrate content of foods  Encourage to control portion sizes of potatoes/corn - Recommend to check glucose, keep log of results and have this record to review at upcoming medical appointments. Patient to contact provider office sooner if needed for readings outside of established parameters or symptoms - Will collaborate with PCP regarding patient's blood sugar control and medication management options If provider plans for patient to continue on DPP-IV inhibitor therapy, might consider switch from Januvia to Tradjenta (which does not require renal dosing adjustment) If needed in future, could consider switch from Januvia to GLP-1 receptor agonist Rybelsus (note patient preference to avoid injectable therapies)   Follow Up Plan: Clinical Pharmacist will follow up with patient by telephone on 10/21/2022 at 9:15 am  Estelle Grumbles, PharmD, [facility] [phone]

## 2022-09-16 NOTE — Patient Instructions (Signed)
Goals Addressed             This Visit's Progress    Pharmacy Goals       Our goal A1c is less than 7%. This corresponds with fasting sugars less than 130 and 2 hour after meal sugars less than 180. Please keep a log of your results when checking your blood sugar   Our goal bad cholesterol, or LDL, is less than 70. This is why it is important to continue taking your rosuvastatin and ezetimibe.  More information regarding the [employer] Patient Assistance program for Eliquis can be found at:   http://rich.org/   Please note that this program requires you to have spent 3% of your annual household income on out-of-pocket prescription expenses for you and/or other members of your household for the year in which you are seeking assistance. Your pharmacy can provide this report.   Our next telephone call is scheduled for 10/21/2022 at 9:15 AM    Please give me a call if needed sooner if you have questions or find that you have met the out of pocket requirement for the above program prior to that time.   Thank you!   Estelle Grumbles, PharmD, [facility] [phone]

## 2022-09-16 NOTE — Telephone Encounter (Signed)
Prescription refill request for Eliquis received. Indication:afib Last office visit:11/23 Scr:2.20  7/24 Age: 82 Weight:80.7  kg  Refill under review

## 2022-09-17 ENCOUNTER — Other Ambulatory Visit: Payer: Self-pay

## 2022-09-17 MED ORDER — APIXABAN 2.5 MG PO TABS: 2.5000 mg | ORAL_TABLET | Freq: Two times a day (BID) | ORAL | 5 refills | Status: DC

## 2022-10-09 ENCOUNTER — Other Ambulatory Visit: Payer: Self-pay | Admitting: Cardiovascular Disease

## 2022-10-16 DIAGNOSIS — H2513 Age-related nuclear cataract, bilateral: Secondary | ICD-10-CM | POA: Diagnosis not present

## 2022-10-16 DIAGNOSIS — E119 Type 2 diabetes mellitus without complications: Secondary | ICD-10-CM | POA: Diagnosis not present

## 2022-10-18 ENCOUNTER — Ambulatory Visit: Payer: Medicare HMO | Admitting: Family Medicine

## 2022-10-18 VITALS — BP 116/56 | HR 61 | Ht 68.0 in | Wt 175.9 lb

## 2022-10-18 DIAGNOSIS — I10 Essential (primary) hypertension: Secondary | ICD-10-CM | POA: Diagnosis not present

## 2022-10-18 DIAGNOSIS — N184 Chronic kidney disease, stage 4 (severe): Secondary | ICD-10-CM | POA: Diagnosis not present

## 2022-10-18 DIAGNOSIS — N2581 Secondary hyperparathyroidism of renal origin: Secondary | ICD-10-CM | POA: Diagnosis not present

## 2022-10-18 DIAGNOSIS — Z23 Encounter for immunization: Secondary | ICD-10-CM | POA: Diagnosis not present

## 2022-10-18 DIAGNOSIS — Z7984 Long term (current) use of oral hypoglycemic drugs: Secondary | ICD-10-CM

## 2022-10-18 DIAGNOSIS — I4821 Permanent atrial fibrillation: Secondary | ICD-10-CM | POA: Diagnosis not present

## 2022-10-18 DIAGNOSIS — I739 Peripheral vascular disease, unspecified: Secondary | ICD-10-CM

## 2022-10-18 DIAGNOSIS — E1122 Type 2 diabetes mellitus with diabetic chronic kidney disease: Secondary | ICD-10-CM | POA: Diagnosis not present

## 2022-10-18 NOTE — Progress Notes (Signed)
Established patient visit   [name]   DOB: 11/29/1940   82 y.o. Male  MRN: [id number] Visit Date: 10/18/2022  Today's healthcare provider: Mila Merry, MD   Chief Complaint  Patient presents with   Medical Management of Chronic Issues    3 month type II diabetes follow up. Last seen on 07/15/22. A1c was 9.5%. Patient was to change metformin to Janumet 50/500 once a day.   Diabetes   Subjective    Feels well without complaints. Changed from metformin to renal dose Janumet at last visit in June. Brings glucose readings which are now much better, mostly in the 150s, rarely above 200s, tolerating medication well without adverse effects. Continues to follow up with nephrology, next appt in January.   Medications: Outpatient Medications Prior to Visit  Medication Sig   apixaban (ELIQUIS) 2.5 MG TABS tablet Take 1 tablet (2.5 mg total) by mouth 2 (two) times daily.   dapagliflozin propanediol (FARXIGA) 10 MG TABS tablet Take 1 tablet (10 mg total) by mouth daily before breakfast. For [place] and [place] medication assistance program.   EPINEPHrine 0.3 mg/0.3 mL IJ SOAJ injection Inject 0.3 mg into the muscle as needed.   ezetimibe (ZETIA) 10 MG tablet TAKE 1 TABLET BY MOUTH EVERY DAY   furosemide (LASIX) 40 MG tablet Take 40 mg by mouth daily.   glipiZIDE (GLUCOTROL) 10 MG tablet Take 1 tablet (10 mg total) by mouth 2 (two) times daily before a meal.   LORazepam (ATIVAN) 1 MG tablet TAKE 1/2 TO 1 TABLET BY MOUTH NIGHTLY AT BEDTIME   metoprolol tartrate (LOPRESSOR) 25 MG tablet TAKE 1 TABLET BY MOUTH TWICE A DAY   ONETOUCH ULTRA test strip USE TO CHECK BLOOD SUGAR DAILY FOR TYPE 2 DIABETES DX: E11.9   pioglitazone (ACTOS) 45 MG tablet TAKE 1 TABLET BY MOUTH EVERY DAY   rosuvastatin (CRESTOR) 40 MG tablet TAKE 1 TABLET BY MOUTH EVERY DAY   sitaGLIPtin-metformin (JANUMET) 50-500 MG tablet Take 1 tablet by mouth daily at 6 (six) AM. TAKE IN PLACE OF METFORMIN   No  facility-administered medications prior to visit.   Review of Systems  Constitutional:  Negative for appetite change, chills and fever.  Respiratory:  Negative for chest tightness, shortness of breath and wheezing.   Cardiovascular:  Negative for chest pain and palpitations.  Gastrointestinal:  Negative for abdominal pain, nausea and vomiting.       Objective    BP (!) 116/56 (BP Location: Right Arm, Patient Position: Sitting, Cuff Size: Normal)   Pulse 61   Ht 5\' 8"  (1.727 m)   Wt 175 lb 14.4 oz (79.8 kg)   SpO2 100%   BMI 26.75 kg/m   Physical Exam   General: Appearance:    Well developed, well nourished male in no acute distress  Eyes:    PERRL, conjunctiva/corneas clear, EOM's intact       Lungs:     Clear to auscultation bilaterally, respirations unlabored  Heart:    Normal heart rate. Irregularly irregular rhythm. No murmurs, rubs, or gallops.    MS:   All extremities are intact.    Neurologic:   Awake, alert, oriented x 3. No apparent focal neurological defect.       A1c=8.%     Assessment & Plan     1. Type 2 diabetes mellitus with chronic kidney disease, without long-term current use of insulin, unspecified CKD stage (HCC) Much better with addition of sitagliptin, although  not quite to goal. Continue current medications.  Recheck A1c in 4 months.   2. Influenza vaccine needed  - Flu Vaccine Trivalent High Dose (Fluad)  3. Essential hypertension Well controlled.  Continue current medications.    4. Permanent atrial fibrillation (HCC) Continue renal dose DOAC, rate well controlled.   5. PAD (peripheral artery disease) (HCC) Asymptomatic. Compliant with medication.  Continue aggressive risk factor modification.    6. Secondary hyperparathyroidism of renal origin [facility]) Continue routine follow up nephrology  7. Chronic kidney disease (CKD), active medical management without dialysis, stage 4 (severe) (HCC) Follow up nephrology in January       Return in  about 4 months (around 02/10/2023), or January 10th or earlier, for Diabetes, Hypertension.      Mila Merry, MD  [facility] [phone] (phone) [phone] (fax)  [facility]

## 2022-10-18 NOTE — Patient Instructions (Signed)
.   Please review the attached list of medications and notify my office if there are any errors.   . Please bring all of your medications to every appointment so we can make sure that our medication list is the same as yours.   

## 2022-10-21 ENCOUNTER — Other Ambulatory Visit: Payer: Medicare HMO | Admitting: Pharmacist

## 2022-10-21 NOTE — Progress Notes (Signed)
10/21/2022 Name: Bobby Thomas MRN: [id number] DOB: 07-17-1940  Chief Complaint  Patient presents with   Medication Assistance    Bobby Thomas is a 82 y.o. year old male who presented for a telephone visit.   They were referred to the pharmacist by their PCP for assistance in managing diabetes and medication access.      Subjective:   Care Team: Primary Care Provider: Malva Limes, MD ; Next Scheduled Visit: 02/04/2023 Nephrologist: Lamont Dowdy, MD; Next Scheduled Visit: 02/20/2023 Cardiologist: Antonieta Iba, MD  Medication Access/Adherence  [facility]  [facility] [address] [place] - [address] [place] [address] Phone: [phone] Fax: [phone]  [facility] - [place], [place] - [address] [place] [address] Phone: [phone] Fax: [phone]   Patient reports affordability concerns with their medications: Yes  Patient reports access/transportation concerns to their pharmacy: No  Patient reports adherence concerns with their medications:  No     Weekly pillbox as filled by spouse. Reports rarely misses doses  Today spouse reports that she has been keeping track of their household out of pocket expenditure on prescriptions and have not yet met the 3% requirement for the Eliquis patient assistance program   Diabetes:   Current medications:  - Farxiga 10 mg daily - glipizide 10 mg twice daily before breakfast and supper (30 minutes prior) - pioglitazone 45 mg daily - Janumet 50/500 mg daily (reports switched from metformin to Janumet as of mid-August)   Previous therapies tried/barriers: patient prefers to avoid injectable medicaitons   Current glucose readings: morning fasting readings ranging 116-166   Reports at latest office visit with PCP on 9/13, provider checked A1C in office and result improved to ~8%. Reports provider advised patient to continue current therapy.   Patient  denies hypoglycemic s/sx including dizziness, shakiness, sweating.    Reports continues to have regular well-balanced meals   Current physical activity: limited to movement around house and yard work   Statin therapy: rosuvastatin 40 mg daily + ezetimibe 10 mg daily   Current medication access support: enrolled in patient assistance for Farxiga from [place] through 02/04/2023   Objective:  Lab Results  Component Value Date   HGBA1C 9.5 (A) 07/15/2022   Lab Results  Component Value Date   NA 137 03/08/2022   CL 95 (L) 03/08/2022   K 4.9 03/08/2022   CO2 21 03/08/2022   BUN 53 (H) 03/08/2022   CREATININE 22 mg/dL 16/96/7893   EGFR 32 (L) 03/08/2022   CALCIUM 10.0 03/08/2022   PHOS 4.4 (H) 03/08/2022   ALBUMIN 4.3 03/08/2022   GLUCOSE 287 (H) 03/08/2022   From review of shared record with [facility], note Creatinine 2.20 mg/dL and eGFR 30 on 09/04/173    Lab Results  Component Value Date   CHOL 87 (L) 03/08/2022   HDL 45 03/08/2022   LDLCALC 22 03/08/2022   TRIG 108 03/08/2022   CHOLHDL 1.9 03/08/2022    Medications Reviewed Today   Medications were not reviewed in this encounter       Assessment/Plan:     Diabetes: - Currently uncontrolled - Reviewed long term cardiovascular and renal outcomes of uncontrolled blood sugar - Reviewed goal A1c, goal fasting, and goal 2 hour post prandial glucose - Reviewed dietary modifications including: Have regular well-balanced meals and snacks spread throughout the day, while controlling portion sizes of carbohydrates  Encourage to continue to review nutrition labels for total carbohydrate content of foods             Encourage to control portion sizes of potatoes/corn - Recommend to check glucose, keep log of results and have this record to review at upcoming medical appointments. Patient to contact provider office sooner if needed for readings outside of established parameters or symptoms - Will  collaborate with PCP and CPhT for aid to patient with applying for patient assistance for Janumet through [employer] patient assistance   Patient to continue to follow up with pharmacy to determine current total amount spent out of pocket on prescriptions for household for calendar year. If has spent 3% out of pocket, patient to follow up with clinical pharmacist and will proceed with application for Eliquis patient assistance for current calendar year.   Follow Up Plan: Clinical Pharmacist will follow up with patient by telephone on 12/18/2022 at 3:30 PM    Estelle Grumbles, PharmD, [facility] [phone]

## 2022-10-22 ENCOUNTER — Encounter: Payer: Self-pay | Admitting: Pharmacist

## 2022-10-22 NOTE — Patient Instructions (Addendum)
Goals Addressed             This Visit's Progress    Pharmacy Goals       Please watch the mail for an envelope from [facility] containing the patient assistance program application. Please complete this application and mail back to [facility] Pharmacy Technician Noreene Larsson [place] along with a copy of your Medicare Part D prescription card and a copy of your proof of income document.   If you need to call Noreene Larsson, you can reach her at [phone]  Our goal A1c is less than 7%. This corresponds with fasting sugars less than 130 and 2 hour after meal sugars less than 180. Please keep a log of your results when checking your blood sugar   Our goal bad cholesterol, or LDL, is less than 70. This is why it is important to continue taking your rosuvastatin and ezetimibe.  More information regarding the [employer] Patient Assistance program for Eliquis can be found at:   http://rich.org/   Please note that this program requires you to have spent 3% of your annual household income on out-of-pocket prescription expenses for you and/or other members of your household for the year in which you are seeking assistance. Your pharmacy can provide this report.     Thank you!   Estelle Grumbles, PharmD, [facility] [phone]

## 2022-10-24 ENCOUNTER — Other Ambulatory Visit: Payer: Self-pay | Admitting: Family Medicine

## 2022-10-25 ENCOUNTER — Telehealth: Payer: Self-pay | Admitting: Pharmacy Technician

## 2022-10-25 DIAGNOSIS — Z5986 Financial insecurity: Secondary | ICD-10-CM

## 2022-10-25 NOTE — Progress Notes (Signed)
[facility] Customer service manager [facility])                                            [facility] Quality Pharmacy Team    10/25/2022  Bobby Thomas 1940/12/26 [id number]                                      Medication Assistance Referral  Referral From:  [facility] PharmD Estelle Grumbles  Medication/Company: Thera Flake / [employer] Patient application portion:  Mailed Provider application portion: Interoffice Mailed to Dr. Mila Merry Provider address/fax verified via: Office website   [name], CPhT [place]  Office: [phone] Fax: [phone] Email: [email or web address]

## 2022-10-28 NOTE — Telephone Encounter (Signed)
Requested medications are due for refill today.  yes  Requested medications are on the active medications list.  yes  Last refill. 07/30/2022 #90 0 rf  Future visit scheduled.   yes  Notes to clinic.  Refill not delegated.    Requested Prescriptions  Pending Prescriptions Disp Refills   LORazepam (ATIVAN) 1 MG tablet [Pharmacy Med Name: LORAZEPAM 1 MG TABLET] 90 tablet 0    Sig: TAKE 1/2 TO 1 TABLET BY MOUTH NIGHTLY AT BEDTIME     Not Delegated - Psychiatry: Anxiolytics/Hypnotics 2 Failed - 10/24/2022  8:05 PM      Failed - This refill cannot be delegated      Failed - Urine Drug Screen completed in last 360 days      Passed - Patient is not pregnant      Passed - Valid encounter within last 6 months    Recent Outpatient Visits           1 week ago Type 2 diabetes mellitus with chronic kidney disease, without long-term current use of insulin, unspecified CKD stage (HCC)   [place] [facility] Malva Limes, MD   3 months ago Type 2 diabetes mellitus with chronic kidney disease, without long-term current use of insulin, unspecified CKD stage (HCC)   [place] [facility] Malva Limes, MD   5 months 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[facility])   [place] [facility] Malva Limes, MD   7 months 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[facility])   [place] [facility] Malva Limes, MD   9 months 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[facility])   [place] [facility] Malva Limes, MD       Future Appointments             In 3 months Fisher, Demetrios Isaacs, MD [facility]   In 3 months Deirdre Evener, MD [facility]

## 2022-11-04 LAB — POCT GLYCOSYLATED HEMOGLOBIN (HGB A1C): Hemoglobin A1C: 8 % — AB (ref 4.0–5.6)

## 2022-11-10 ENCOUNTER — Other Ambulatory Visit: Payer: Self-pay | Admitting: Family Medicine

## 2022-11-11 NOTE — Telephone Encounter (Signed)
Requested Prescriptions  Pending Prescriptions Disp Refills   metoprolol tartrate (LOPRESSOR) 25 MG tablet [Pharmacy Med Name: METOPROLOL TARTRATE 25 MG TAB] 180 tablet 1    Sig: TAKE 1 TABLET BY MOUTH TWICE A DAY     Cardiovascular:  Beta Blockers Passed - 11/10/2022 10:20 AM      Passed - Last BP in normal range    BP Readings from Last 1 Encounters:  10/18/22 (!) 116/56         Passed - Last Heart Rate in normal range    Pulse Readings from Last 1 Encounters:  10/18/22 61         Passed - Valid encounter within last 6 months    Recent Outpatient Visits           3 weeks ago Type 2 diabetes mellitus with chronic kidney disease, without long-term current use of insulin, unspecified CKD stage (HCC)   [place] [facility] Malva Limes, MD   3 months ago Type 2 diabetes mellitus with chronic kidney disease, without long-term current use of insulin, unspecified CKD stage (HCC)   [place] [facility] Malva Limes, MD   6 months 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[facility])   [place] [facility] Malva Limes, MD   8 months 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[facility])   [place] [facility] Malva Limes, MD   10 months 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[facility])   [place] [facility] Malva Limes, MD       Future Appointments             In 2 months Fisher, Demetrios Isaacs, MD [facility], [facility]   In 3 months Deirdre Evener, MD [facility]

## 2022-12-02 ENCOUNTER — Telehealth: Payer: Self-pay | Admitting: Pharmacy Technician

## 2022-12-02 DIAGNOSIS — Z5986 Financial insecurity: Secondary | ICD-10-CM

## 2022-12-02 NOTE — Progress Notes (Signed)
[facility] Customer service manager [facility])                                            [facility] Quality Pharmacy Team    12/02/2022  Bobby Thomas July 08, 1940 [id number]  Received both patient and provider portion(s) of patient assistance application(s) for Janumet. Mailed completed application and required documents into [employer].   [name], CPhT [name]  Office: [phone] Fax: [phone] Email: [email or web address]

## 2022-12-18 ENCOUNTER — Other Ambulatory Visit: Payer: Medicare HMO | Admitting: Pharmacist

## 2022-12-30 ENCOUNTER — Other Ambulatory Visit: Payer: Self-pay | Admitting: Pharmacist

## 2022-12-30 NOTE — Progress Notes (Signed)
12/30/2022 Name: Bobby Thomas MRN: [id number] DOB: 29-Aug-1940  Chief Complaint  Patient presents with   Medication Assistance   Medication Management    Bobby Thomas is a 82 y.o. year old male who presented for a telephone visit.   They were referred to the pharmacist by their PCP for assistance in managing diabetes and medication access.      Subjective:   Care Team: Primary Care Provider: Malva Limes, MD ; Next Scheduled Visit: 02/04/2023 Nephrologist: Lamont Dowdy, MD; Next Scheduled Visit: 02/20/2023 Cardiologist: Antonieta Iba, MD  Medication Access/Adherence  Current Pharmacy:  [facility] [address] [place] - [address] [place] [address] Phone: [phone] Fax: [phone]  [facility] - [place], [place] - [address] [place] [address] Phone: [phone] Fax: [phone]   Patient reports affordability concerns with their medications: Yes  Patient reports access/transportation concerns to their pharmacy: No  Patient reports adherence concerns with their medications:  No     Weekly pillbox as filled by spouse. Reports rarely misses doses    Diabetes:   Current medications:  - Farxiga 10 mg daily - glipizide 10 mg twice daily before breakfast and supper (30 minutes prior) - pioglitazone 45 mg daily - Janumet 50/500 mg daily    Previous therapies tried/barriers: patient prefers to avoid injectable medicaitons   Current glucose readings: morning fasting readings ranging 124-180 - Attributes variation to overnight snacks  Current dietary habits: Breakfast: 2 eggs and sausage biscuit  Lunch: soup or sandwich Supper: meat + 2 vegetables (green beans, peas, greens or beans)  Snacks: 1/2 peanut butter sandwich (after supper), oatmeal raisin cookies, overnight snack (nuts, cheese and cranberries snack pack), cookie bar  Drinks: water and coffee in morning    Patient denies hypoglycemic  s/sx including dizziness, shakiness, sweating.    Reports continues to have regular well-balanced meals   Current physical activity: limited to movement around house and yard work   Statin therapy: rosuvastatin 40 mg daily + ezetimibe 10 mg daily   Current medication access support:  - Enrolled in patient assistance for Farxiga from [place] through 02/04/2023 Patient's wife thinks that she completed the re-enrollment via [employer]Me Writer through email in October, but is going to follow up with [employer] to confirm - Collaborating with PCP and CPhT for aid to patient with applying for patient assistance for Janumet through [employer] patient assistance  Confirms received attestation letter back from [employer] and plans to mail this back to company on 12/1 as discussed with CPhT   Objective:  Lab Results  Component Value Date   HGBA1C 8.0 (A) 11/04/2022    Lab Results  Component Value Date   CREATININE 22 mg/dL 56/43/3295   BUN 53 (H) 03/08/2022   NA 137 03/08/2022   K 4.9 03/08/2022   CL 95 (L) 03/08/2022   CO2 21 03/08/2022    Lab Results  Component Value Date   CHOL 87 (L) 03/08/2022   HDL 45 03/08/2022   LDLCALC 22 03/08/2022   TRIG 108 03/08/2022   CHOLHDL 1.9 03/08/2022    Medications Reviewed Today     Reviewed by Manuela Neptune, RPH-CPP (Pharmacist) on 12/30/22 at 1108  Med List Status: <None>   Medication Order Taking? Sig Documenting Provider Last Dose Status Informant  apixaban (ELIQUIS) 2.5 MG TABS tablet [id number]  Take 1 tablet (2.5 mg total) by mouth 2 (two) times daily.  Antonieta Iba, MD  Active   dapagliflozin propanediol (FARXIGA) 10 MG TABS tablet [id number] Yes Take 1 tablet (10 mg total) by mouth daily before breakfast. For [place] and [place] medication assistance program. Malva Limes, MD Taking Active   EPINEPHrine 0.3 mg/0.3 mL IJ SOAJ injection [id number]  Inject 0.3 mg into the muscle as needed. Malva Limes, MD  Active   ezetimibe (ZETIA) 10 MG  tablet [id number]  TAKE 1 TABLET BY MOUTH EVERY DAY Gollan, Tollie Pizza, MD  Active   furosemide (LASIX) 40 MG tablet [id number]  Take 40 mg by mouth daily. [provider]  Active   glipiZIDE (GLUCOTROL) 10 MG tablet [id number] Yes Take 1 tablet (10 mg total) by mouth 2 (two) times daily before a meal. Malva Limes, MD Taking Active   LORazepam (ATIVAN) 1 MG tablet [id number]  TAKE 1/2 TO 1 TABLET BY MOUTH NIGHTLY AT BEDTIME Malva Limes, MD  Active   metoprolol tartrate (LOPRESSOR) 25 MG tablet [id number]  TAKE 1 TABLET BY MOUTH TWICE A DAY Malva Limes, MD  Active   [facility] ULTRA test strip [id number]  USE TO CHECK BLOOD SUGAR DAILY FOR TYPE 2 DIABETES DX: E11.9 Malva Limes, MD  Active   pioglitazone (ACTOS) 45 MG tablet [id number] Yes TAKE 1 TABLET BY MOUTH EVERY DAY Malva Limes, MD Taking Active   rosuvastatin (CRESTOR) 40 MG tablet [id number]  TAKE 1 TABLET BY MOUTH EVERY DAY Gollan, Tollie Pizza, MD  Active   sitaGLIPtin-metformin (JANUMET) 50-500 MG tablet [id number] Yes Take 1 tablet by mouth daily at 6 (six) AM. TAKE IN PLACE OF METFORMIN Malva Limes, MD Taking Active               Assessment/Plan:   Patient/spouse plan to follow up with Cardiology to see if samples of Eliquis available to help with cost of this medication for remainder of calendar year  Send message to PCP to request provider recheck patient's renal function at upcoming office visit  Diabetes: - Currently uncontrolled - Reviewed long term cardiovascular and renal outcomes of uncontrolled blood sugar - Reviewed goal A1c, goal fasting, and goal 2 hour post prandial glucose - Reviewed dietary modifications including: Have regular well-balanced meals and snacks spread throughout the day, while controlling portion sizes of carbohydrates             Encourage to continue to review nutrition labels for total carbohydrate content of foods             Encourage to control portion sizes of  potatoes/corn - Recommend to check glucose, keep log of results and have this record to review at upcoming medical appointments. Patient to contact provider office sooner if needed for readings outside of established parameters or symptoms - Collaborating with PCP and CPhT for aid to patient with applying for patient assistance for Janumet through [employer] patient assistance  Plans to mail attestation letter back to [employer] on 12/1 - Will collaborate with PCP to request provider send renewal of Marcelline Deist to [facility] (dispensing pharmacy for [place]) for 2025 calendar year     Follow Up Plan: Clinical Pharmacist will follow up with patient by telephone on 03/10/2023 at 9:00 AM     Estelle Grumbles, PharmD, [facility] [phone]

## 2023-01-01 ENCOUNTER — Telehealth: Payer: Self-pay | Admitting: Pharmacist

## 2023-01-01 NOTE — Patient Instructions (Addendum)
Goals Addressed             This Visit's Progress    Pharmacy Goals       If you need to call Noreene Larsson, you can reach her at [phone]  Our goal A1c is less than 7%. This corresponds with fasting sugars less than 130 and 2 hour after meal sugars less than 180. Please keep a log of your results when checking your blood sugar   Our goal bad cholesterol, or LDL, is less than 70. This is why it is important to continue taking your rosuvastatin and ezetimibe.    Thank you!   Estelle Grumbles, PharmD, [facility] [phone]

## 2023-01-01 NOTE — Progress Notes (Signed)
Dr. Sherrie Mustache, patient completed re-enrollment for AZ&Me for 2025. Would you please e-prescribe renewal for his Farxiga prescription to [facility] (dispensing pharmacy for [place]) - I have pended an order within this encounter - please sign if this is okay.  Also, would you please consider re-checking patient's renal function at upcoming appointment with you on 12/31?  Thank you!  Estelle Grumbles, PharmD, [facility] [phone]

## 2023-01-03 MED ORDER — DAPAGLIFLOZIN PROPANEDIOL 10 MG PO TABS: 10.0000 mg | ORAL_TABLET | Freq: Every day | ORAL | 4 refills | Status: DC

## 2023-01-08 ENCOUNTER — Telehealth: Payer: Self-pay | Admitting: Pharmacist

## 2023-01-08 NOTE — Telephone Encounter (Signed)
I received a call frorm Music therapist requesting updated Rx for [place]. It looks like prescription was sent in 01/03/2023 to [facility]. Representative from [employer] requested Rx to be faxed to them and resent to [facility] as well.  [place] also requested updated fax number for Dr Sherrie Mustache. Provided faxed number on file in Epic - [phone]. I will forward message to pharmacist that usually works with Dr Sherrie Mustache for follow up.

## 2023-01-09 NOTE — Progress Notes (Unsigned)
[employer] Forms received. [place] portion completed. Forms placed back in providers box to be reviewed and signed.

## 2023-01-20 ENCOUNTER — Telehealth: Payer: Self-pay | Admitting: Pharmacy Technician

## 2023-01-20 DIAGNOSIS — Z5986 Financial insecurity: Secondary | ICD-10-CM

## 2023-01-20 NOTE — Progress Notes (Signed)
Pharmacy Medication Assistance Program Note    01/20/2023  Patient ID: [name], male   DOB: [birth year], 82 y.o.   MRN: [id number]     01/20/2023  Outreach Medication One  Initial Outreach Date (Medication One) 10/22/2022  Manufacturer Medication One [employer]  [employer] Janumet  Dose of Janumet 50-500mg   Type of Radiographer, therapeutic Assistance  Date Application Sent to Patient 10/24/2022  Application Items Requested Application;Proof of Income;Other  Date Application Sent to Prescriber 10/24/2022  Name of Prescriber Mila Merry  Date Application Received From Patient 11/29/2022  Application Items Received From Patient Application;Proof of Income;Other  Date Application Received From Provider 11/07/2022  Date Application Submitted to Manufacturer 11/29/2022  Method Application Sent to Manufacturer Mailed  Patient Assistance Determination Approved  Approval Start Date 01/10/2023  Approval End Date 02/04/2024  Patient Notification Method Telephone Call  Telephone Call Outcome Left Voicemail    Care coordination call placed to [employer] in regard to Janumet application.  Spoke to [place] who informs patient is APPROVED 01/10/23-01/3124. Patient will have to call [employer] when he has 14 days of medication or less left by calling [phone]. Medication will be delivered to the patient's home. Successful outreach to patient's wife Sallye Ober. HIPAA verified. Informed her of patient's approval, when to expect the medication and refill procedure. Patient's wife verbalized understanding.  Signature Kristopher Glee [facility]  Office: [phone] Fax: [phone] Email: [email or web address]

## 2023-01-25 ENCOUNTER — Other Ambulatory Visit: Payer: Self-pay | Admitting: Cardiovascular Disease

## 2023-01-27 ENCOUNTER — Telehealth: Payer: Self-pay | Admitting: Cardiovascular Disease

## 2023-01-27 NOTE — Telephone Encounter (Signed)
Please contact pt for future appointment. Pt due for 12 month f/u. 

## 2023-01-27 NOTE — Telephone Encounter (Signed)
Left voicemail, pt is due for 12 month follow up appt

## 2023-01-27 NOTE — Telephone Encounter (Signed)
Pt scheduled on 2/24

## 2023-01-27 NOTE — Telephone Encounter (Signed)
Left voice mail

## 2023-02-02 ENCOUNTER — Other Ambulatory Visit: Payer: Self-pay | Admitting: Family Medicine

## 2023-02-04 ENCOUNTER — Ambulatory Visit: Payer: Medicare HMO | Admitting: Family Medicine

## 2023-02-04 DIAGNOSIS — N184 Chronic kidney disease, stage 4 (severe): Secondary | ICD-10-CM

## 2023-02-12 ENCOUNTER — Ambulatory Visit: Payer: Medicare HMO | Admitting: Dermatology

## 2023-02-14 ENCOUNTER — Ambulatory Visit (INDEPENDENT_AMBULATORY_CARE_PROVIDER_SITE_OTHER): Payer: HMO | Admitting: Family Medicine

## 2023-02-14 VITALS — BP 105/56 | HR 65 | Resp 18 | Ht 68.0 in | Wt 170.0 lb

## 2023-02-14 DIAGNOSIS — N183 Chronic kidney disease, stage 3 unspecified: Secondary | ICD-10-CM

## 2023-02-14 DIAGNOSIS — I10 Essential (primary) hypertension: Secondary | ICD-10-CM

## 2023-02-14 DIAGNOSIS — E782 Mixed hyperlipidemia: Secondary | ICD-10-CM

## 2023-02-14 DIAGNOSIS — E1122 Type 2 diabetes mellitus with diabetic chronic kidney disease: Secondary | ICD-10-CM | POA: Diagnosis not present

## 2023-02-14 DIAGNOSIS — I480 Paroxysmal atrial fibrillation: Secondary | ICD-10-CM | POA: Diagnosis not present

## 2023-02-14 DIAGNOSIS — Z7984 Long term (current) use of oral hypoglycemic drugs: Secondary | ICD-10-CM | POA: Diagnosis not present

## 2023-02-14 LAB — PROTEIN / CREATININE RATIO, URINE
Creatinine Random, Urine: 22
Protein Creatinine Ratio: 1818
Protein, Urine: 40

## 2023-02-14 MED ORDER — METOPROLOL SUCCINATE ER 25 MG PO TB24: 25.0000 mg | ORAL_TABLET | Freq: Every day | ORAL | 3 refills | Status: DC

## 2023-02-14 NOTE — Progress Notes (Signed)
 Established patient visit   [name]   DOB: 21-Jan-1941   83 y.o. Male  MRN: [id number] Visit Date: 02/14/2023  Today's healthcare provider: Nancyann Perry, MD   Chief Complaint  Patient presents with   Diabetes   Hypertension   Subjective    Discussed the use of AI scribe software for clinical note transcription with the patient, who gave verbal consent to proceed.  History of Present Illness   The patient, with a history of hypertension, diabetes, and CKD presents for routine follow up. The patient's blood glucose levels have been monitored at home and appear to be improving. He has been tolerating his medications well, with no reported side effects.  He has follow up scheduled with renal later this month and with his cardiologist in February for a-fib.  The patient also reports occasional monitoring of his blood pressure at home, which he describes as consistently low. He denies any symptoms of dizziness, light-headedness, or near syncope. The patient's medication regimen includes glipizide , taken twice daily, and metoprolol  IR 25mg , also taken twice daily.       Medications: Outpatient Medications Prior to Visit  Medication Sig Note   apixaban  (ELIQUIS ) 2.5 MG TABS tablet Take 1 tablet (2.5 mg total) by mouth 2 (two) times daily.    dapagliflozin  propanediol (FARXIGA ) 10 MG TABS tablet Take 1 tablet (10 mg total) by mouth daily before breakfast. For [place] and [place] medication assistance program.    EPINEPHrine  0.3 mg/0.3 mL IJ SOAJ injection Inject 0.3 mg into the muscle as needed.    ezetimibe  (ZETIA ) 10 MG tablet TAKE 1 TABLET BY MOUTH EVERY DAY    furosemide (LASIX) 40 MG tablet Take 40 mg by mouth daily.    glipiZIDE  (GLUCOTROL ) 10 MG tablet Take 1 tablet (10 mg total) by mouth 2 (two) times daily before a meal.    JANUMET  50-500 MG tablet TAKE 1 TABLET BY MOUTH DAILY AT 6AM IN PLACE OF METFORMIN     LORazepam  (ATIVAN ) 1 MG tablet TAKE 1/2 TO 1 TABLET BY MOUTH  NIGHTLY AT BEDTIME    ONETOUCH ULTRA test strip USE TO CHECK BLOOD SUGAR DAILY FOR TYPE 2 DIABETES DX: E11.9    pioglitazone  (ACTOS ) 45 MG tablet TAKE 1 TABLET BY MOUTH EVERY DAY    rosuvastatin  (CRESTOR ) 40 MG tablet TAKE 1 TABLET BY MOUTH EVERY DAY    [DISCONTINUED] metoprolol  tartrate (LOPRESSOR ) 25 MG tablet TAKE 1 TABLET BY MOUTH TWICE A DAY 02/14/2023: TO 25 ER ONCE A DAY DUE TO LOW BLOOD PRESSURE   No facility-administered medications prior to visit.   Review of Systems     Objective    BP (!) 105/56   Pulse 65   Resp 18   Ht 5' 8 (1.727 m)   Wt 170 lb (77.1 kg)   BMI 25.85 kg/m   Physical Exam   General: Appearance:    Well developed, well nourished male in no acute distress  Eyes:    PERRL, conjunctiva/corneas clear, EOM's intact       Lungs:     Clear to auscultation bilaterally, respirations unlabored  Heart:    Normal heart rate. Irregularly irregular rhythm. No murmurs, rubs, or gallops.    MS:   All extremities are intact.    Neurologic:   Awake, alert, oriented x 3. No apparent focal neurological defect.         Assessment & Plan        Hypertension Blood  pressure well controlled, but patient reports occasional dizziness. Currently on Metoprolol  IR 25mg  twice daily. -Change Metoprolol  to ER formulation (25mg ) to be taken in the morning to potentially reduce dizziness. -Continue monitoring blood pressure at home.  Diabetes Blood sugars appear to be well controlled on current regimen of Janumet . -Continue current regimen.  General Health Maintenance -Order renal labs for his upcoming appt with Dr. Douglas as well as A1c and lipids    No follow-ups on file.      Nancyann Perry, MD  [facility] [phone] (phone) [phone] (fax)  [facility]

## 2023-02-15 LAB — HEMOGLOBIN A1C
Est. average glucose Bld gHb Est-mCnc: 217 mg/dL
Hgb A1c MFr Bld: 9.2 % — ABNORMAL HIGH (ref 4.8–5.6)

## 2023-02-15 LAB — RENAL FUNCTION PANEL
Albumin: 4.1 g/dL (ref 3.8–4.8)
BUN/Creatinine Ratio: 22 (ref 10–24)
BUN: 51 mg/dL — ABNORMAL HIGH (ref 8–27)
CO2: 28 mmol/L (ref 20–29)
Calcium: 10 mg/dL (ref 8.6–10.2)
Chloride: 94 mmol/L — ABNORMAL LOW (ref 96–106)
Creatinine, Ser: 2.33 mg/dL — ABNORMAL HIGH (ref 0.76–1.27)
Glucose: 122 mg/dL — ABNORMAL HIGH (ref 70–99)
Phosphorus: 4 mg/dL (ref 2.8–4.1)
Potassium: 4.9 mmol/L (ref 3.5–5.2)
Sodium: 139 mmol/L (ref 134–144)
eGFR: 28 mL/min/{1.73_m2} — ABNORMAL LOW (ref >59)

## 2023-02-15 LAB — LIPID PANEL
Chol/HDL Ratio: 2.2 {ratio} (ref 0.0–5.0)
Cholesterol, Total: 99 mg/dL — ABNORMAL LOW (ref 100–199)
HDL: 45 mg/dL (ref >39)
LDL Chol Calc (NIH): 31 mg/dL (ref 0–99)
Triglycerides: 133 mg/dL (ref 0–149)
VLDL Cholesterol Cal: 23 mg/dL (ref 5–40)

## 2023-02-15 LAB — CBC
Hematocrit: 39.5 % (ref 37.5–51.0)
Hemoglobin: 12.8 g/dL — ABNORMAL LOW (ref 13.0–17.7)
MCH: 28.8 pg (ref 26.6–33.0)
MCHC: 32.4 g/dL (ref 31.5–35.7)
MCV: 89 fL (ref 79–97)
Platelets: 353 10*3/uL (ref 150–450)
RBC: 4.45 x10E6/uL (ref 4.14–5.80)
RDW: 15.2 % (ref 11.6–15.4)
WBC: 14 10*3/uL — ABNORMAL HIGH (ref 3.4–10.8)

## 2023-02-15 LAB — VITAMIN D 25 HYDROXY (VIT D DEFICIENCY, FRACTURES): Vit D, 25-Hydroxy: 39.1 ng/mL (ref 30.0–100.0)

## 2023-02-15 LAB — PARATHYROID HORMONE, INTACT (NO CA): PTH: 44 pg/mL (ref 15–65)

## 2023-02-16 ENCOUNTER — Other Ambulatory Visit: Payer: Self-pay | Admitting: Family Medicine

## 2023-02-20 DIAGNOSIS — E1122 Type 2 diabetes mellitus with diabetic chronic kidney disease: Secondary | ICD-10-CM | POA: Diagnosis not present

## 2023-02-20 DIAGNOSIS — N2581 Secondary hyperparathyroidism of renal origin: Secondary | ICD-10-CM | POA: Diagnosis not present

## 2023-02-20 DIAGNOSIS — I129 Hypertensive chronic kidney disease with stage 1 through stage 4 chronic kidney disease, or unspecified chronic kidney disease: Secondary | ICD-10-CM | POA: Diagnosis not present

## 2023-02-20 DIAGNOSIS — E875 Hyperkalemia: Secondary | ICD-10-CM | POA: Diagnosis not present

## 2023-02-20 DIAGNOSIS — R809 Proteinuria, unspecified: Secondary | ICD-10-CM | POA: Diagnosis not present

## 2023-02-20 DIAGNOSIS — N1832 Chronic kidney disease, stage 3b: Secondary | ICD-10-CM | POA: Diagnosis not present

## 2023-02-21 ENCOUNTER — Other Ambulatory Visit: Payer: Self-pay | Admitting: Family Medicine

## 2023-02-21 DIAGNOSIS — E1122 Type 2 diabetes mellitus with diabetic chronic kidney disease: Secondary | ICD-10-CM

## 2023-02-21 MED ORDER — RYBELSUS 3 MG PO TABS: 3.0000 mg | ORAL_TABLET | Freq: Every day | ORAL | 0 refills | Status: DC

## 2023-02-27 ENCOUNTER — Encounter: Payer: Self-pay | Admitting: Family Medicine

## 2023-02-27 ENCOUNTER — Ambulatory Visit
Admission: RE | Admit: 2023-02-27 | Discharge: 2023-02-27 | Disposition: A | Discharge status: Home / Self Care | Payer: HMO | Attending: Family Medicine | Admitting: Family Medicine

## 2023-02-27 ENCOUNTER — Ambulatory Visit: Payer: Self-pay | Admitting: *Deleted

## 2023-02-27 ENCOUNTER — Ambulatory Visit: Payer: HMO | Admitting: Family Medicine

## 2023-02-27 ENCOUNTER — Ambulatory Visit: Payer: Medicare HMO | Admitting: Dermatology

## 2023-02-27 ENCOUNTER — Ambulatory Visit
Admission: RE | Admit: 2023-02-27 | Discharge: 2023-02-27 | Disposition: A | Discharge status: Home / Self Care | Payer: HMO | Source: Ambulatory Visit | Attending: Family Medicine | Admitting: Family Medicine

## 2023-02-27 ENCOUNTER — Telehealth: Payer: Self-pay | Admitting: Family Medicine

## 2023-02-27 VITALS — BP 119/60 | HR 70 | Temp 97.7°F | Wt 168.0 lb

## 2023-02-27 DIAGNOSIS — M79646 Pain in unspecified finger(s): Secondary | ICD-10-CM

## 2023-02-27 DIAGNOSIS — U071 COVID-19: Secondary | ICD-10-CM

## 2023-02-27 DIAGNOSIS — N184 Chronic kidney disease, stage 4 (severe): Secondary | ICD-10-CM | POA: Diagnosis not present

## 2023-02-27 DIAGNOSIS — R918 Other nonspecific abnormal finding of lung field: Secondary | ICD-10-CM | POA: Diagnosis not present

## 2023-02-27 DIAGNOSIS — W19XXXA Unspecified fall, initial encounter: Secondary | ICD-10-CM | POA: Diagnosis not present

## 2023-02-27 DIAGNOSIS — R0781 Pleurodynia: Secondary | ICD-10-CM

## 2023-02-27 DIAGNOSIS — J3489 Other specified disorders of nose and nasal sinuses: Secondary | ICD-10-CM

## 2023-02-27 DIAGNOSIS — R042 Hemoptysis: Secondary | ICD-10-CM

## 2023-02-27 DIAGNOSIS — R42 Dizziness and giddiness: Secondary | ICD-10-CM | POA: Diagnosis not present

## 2023-02-27 DIAGNOSIS — C3492 Malignant neoplasm of unspecified part of left bronchus or lung: Secondary | ICD-10-CM | POA: Insufficient documentation

## 2023-02-27 DIAGNOSIS — W1800XA Striking against unspecified object with subsequent fall, initial encounter: Secondary | ICD-10-CM | POA: Insufficient documentation

## 2023-02-27 LAB — POC COVID19 BINAXNOW: SARS Coronavirus 2 Ag: POSITIVE — AB

## 2023-02-27 LAB — POCT INFLUENZA A/B
Influenza A, POC: NEGATIVE
Influenza B, POC: NEGATIVE

## 2023-02-27 MED ORDER — NIRMATRELVIR/RITONAVIR (PAXLOVID) TABLET (RENAL DOSING): 2.0000 | ORAL_TABLET | Freq: Two times a day (BID) | ORAL | 0 refills | Status: AC

## 2023-02-27 MED ORDER — LIDOCAINE 5 % EX PTCH: 1.0000 | MEDICATED_PATCH | CUTANEOUS | 0 refills | Status: DC
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
## 2023-02-27 NOTE — Telephone Encounter (Signed)
  Chief Complaint: Larey Seat yesterday morning getting off the toilet and fell onto the side of the tub injuring his left side under his ribs.   No bruising but hurts to take a deep breath. Symptoms: Been lightheaded and weak with some dizziness.   He was having some dizziness prior to falling yesterday.   He does not remember if he was dizzy or not when he lost his balance getting of the toilet.   He is also coughing up blood tinged mucus.   The coughing is making his side really hurt. Frequency: Since yesterday morning.   Blood tinged sputum happened while wife on the phone with me.    Pertinent Negatives: Patient denies fever but has not checked it either. Disposition: [] ED /[] Urgent Care (no appt availability in office) / [x] Appointment(In office/virtual)/ []  [facility]/ [] Home Care/ [] Refused Recommended Disposition /[] [facility]/ []  Follow-up with PCP Additional Notes: Able to schedule him with Dr. Roxan Hockey for today at 1:20.   They can be at the office in time to be checked in and seen on time.

## 2023-02-27 NOTE — Addendum Note (Signed)
Addended by: Bing Neighbors on: 02/27/2023 04:14 PM   Modules accepted: Orders

## 2023-02-27 NOTE — Telephone Encounter (Signed)
Reason for Disposition  [1] MODERATE weakness (i.e., interferes with work, school, normal activities) AND [2] new-onset or worsening  Answer Assessment - Initial Assessment Questions 1. MECHANISM: "How did the fall happen?"     He fell yesterday.   He hit left lower side and he has congestion in his head.   Wife calling in.    He hit under the ribs.   No bruising.   He was getting off toilet and lost his balance and fell on the side of the tub.     Having pain with deep breaths.  He is guarding his side.    We put his back brace on that he had.   It helps some but not much.   He is taking extra strength Tylenol.     He has congestion in his nose and a cough.  Not sure about fever.    He is coughing up mucus with pink in it.   The coughing is making the side hurt worse. 2. DOMESTIC VIOLENCE AND ELDER ABUSE SCREENING: "Did you fall because someone pushed you or tried to hurt you?" If Yes, ask: "Are you safe now?"     N/A 3. ONSET: "When did the fall happen?" (e.g., minutes, hours, or days ago)     Yesterday morning 6:00 AM. 4. LOCATION: "What part of the body hit the ground?" (e.g., back, buttocks, head, hips, knees, hands, head, stomach)     Left side under his ribs. 5. INJURY: "Did you hurt (injure) yourself when you fell?" If Yes, ask: "What did you injure? Tell me more about this?" (e.g., body area; type of injury; pain severity)"     Yes see above 6. PAIN: "Is there any pain?" If Yes, ask: "How bad is the pain?" (e.g., Scale 1-10; or mild,  moderate, severe)   - NONE (0): No pain   - MILD (1-3): Doesn't interfere with normal activities    - MODERATE (4-7): Interferes with normal activities or awakens from sleep    - SEVERE (8-10): Excruciating pain, unable to do any normal activities      Moderate 7. SIZE: For cuts, bruises, or swelling, ask: "How large is it?" (e.g., inches or centimeters)      No bruising 8. PREGNANCY: "Is there any chance you are pregnant?" "When was your last  menstrual period?"     N/A 9. OTHER SYMPTOMS: "Do you have any other symptoms?" (e.g., dizziness, fever, weakness; new onset or worsening).      Feeling weak and light headed when he gets up and staggers a little.   That started before his fall.    10. CAUSE: "What do you think caused the fall (or falling)?" (e.g., tripped, dizzy spell)       He said he doesn't remember if he was lightheaded or not when he was getting off the toilet.   He has a speech problem.  Protocols used: [facility]

## 2023-02-27 NOTE — Telephone Encounter (Signed)
[employer] is requesting prior authorization Key: [id number] Lidocaine 5% Patches

## 2023-02-27 NOTE — Assessment & Plan Note (Signed)
Reports feeling lightheaded and weak for several days before the fall. Symptoms may be related to multiple medications causing hypoglycemia or hypotension. Last creatinine was 2.3 on January 10th. Blood pressure is 119/60. Given the brief loss of consciousness and use of Eliquis, there is a concern for potential intracranial bleeding. - Order stat head CT without contrast - Order complete metabolic panel (CMP) - Order COVID and flu testing

## 2023-02-27 NOTE — Telephone Encounter (Signed)
  Chief Complaint: Patient wants to know if he needs to stop rosuvastatin while on Paxlovid Symptoms: COVID treatment   Disposition: [] ED /[] Urgent Care (no appt availability in office) / [] Appointment(In office/virtual)/ []  [facility]/ [] Home Care/ [] Refused Recommended Disposition /[] [facility]/ [x]  Follow-up with PCP Additional Notes: Pharmacist advised patient to ask provider if he needs to stop  rosuvastatin (CRESTOR) 40 MG tablet

## 2023-02-27 NOTE — Assessment & Plan Note (Signed)
Chronic kidney disease with creatinine of 2.3 and GFR of 28, limiting the use of certain medications, including NSAIDs. - Monitor kidney function - Avoid NSAIDs - F/u with nephrology - updated CMP ordered

## 2023-02-27 NOTE — Assessment & Plan Note (Signed)
Coughing up bright red blood for four to five days before the fall. Given the long smoking history, there is a concern for potential lung cancer. Considering the significant amount of blood, a chest CT is preferred over a chest x-ray for more detailed imaging. - Order chest xray stat  - patient has significant smoking history and will likely need chest CT for lung cancer screening

## 2023-02-27 NOTE — Assessment & Plan Note (Signed)
Acute  COVID + on testing in clinic today  Given hx of Afib, CKD will treat with renal dosing of paxlovid for 5 days  GFR 28

## 2023-02-27 NOTE — Assessment & Plan Note (Signed)
[age]-year-old reports falling on the left hip and side of the ribs. No head injury. Pain localized to the left posterior ribs. Given the use of Eliquis, there is a concern for potential rib fracture and internal bleeding. Allergic to codeine, hydrocodone, and morphine, limiting pain management options. Advanced kidney disease with creatinine of 2.3 and GFR of 28; NSAIDs not recommended. - Order stat chest x-ray - Recommend Tylenol for pain management - Recommend lidocaine patches for pain relief - Avoid NSAIDs due to advanced kidney disease

## 2023-02-27 NOTE — Telephone Encounter (Signed)
Summary: Question about paxlovid interfering with cholesterol medication   Pt's wife called with a question regarding paxlovid interfering with the patient's blood pressure medication. Please advise  Best contact: [phone] or [phone]         Attempted to contact Louise- no answer- left message to call office. Call to alternate number- got answer Reason for Disposition . [1] Caller has NON-URGENT medicine question about med that PCP prescribed AND [2] triager unable to answer question  Answer Assessment - Initial Assessment Questions 1. NAME of MEDICINE: "What medicine(s) are you calling about?"     rosuvastatin (CRESTOR) 40 MG tablet 2. QUESTION: "What is your question?" (e.g., double dose of medicine, side effect)     Pharmacy told patient to ask PCP if he should stop rosuvastatin (CRESTOR) 40 MG tablet- while taking Paxlovid.  3. PRESCRIBER: "Who prescribed the medicine?" Reason: if prescribed by specialist, call should be referred to that group.     PCP Advised normally advised to hold statin medication while taking- will get definitive answer from provider. Patient normally takes at night will hold tonight's dose and await call tomorrow.  Protocols used: Medication Question Call-A-[facility]

## 2023-02-28 ENCOUNTER — Encounter: Payer: Self-pay | Admitting: Family Medicine

## 2023-02-28 LAB — COMPREHENSIVE METABOLIC PANEL
ALT: 26 [IU]/L (ref 0–44)
AST: 33 [IU]/L (ref 0–40)
Albumin: 4 g/dL (ref 3.8–4.8)
Alkaline Phosphatase: 60 [IU]/L (ref 44–121)
BUN/Creatinine Ratio: 21 (ref 10–24)
BUN: 53 mg/dL — ABNORMAL HIGH (ref 8–27)
Bilirubin Total: 0.5 mg/dL (ref 0.0–1.2)
CO2: 22 mmol/L (ref 20–29)
Calcium: 9.4 mg/dL (ref 8.6–10.2)
Chloride: 94 mmol/L — ABNORMAL LOW (ref 96–106)
Creatinine, Ser: 2.51 mg/dL — ABNORMAL HIGH (ref 0.76–1.27)
Globulin, Total: 3.5 g/dL (ref 1.5–4.5)
Glucose: 177 mg/dL — ABNORMAL HIGH (ref 70–99)
Potassium: 4.6 mmol/L (ref 3.5–5.2)
Sodium: 136 mmol/L (ref 134–144)
Total Protein: 7.5 g/dL (ref 6.0–8.5)
eGFR: 25 mL/min/{1.73_m2} — ABNORMAL LOW (ref >59)

## 2023-02-28 LAB — CBC
Hematocrit: 38.5 % (ref 37.5–51.0)
Hemoglobin: 12.7 g/dL — ABNORMAL LOW (ref 13.0–17.7)
MCH: 28.6 pg (ref 26.6–33.0)
MCHC: 33 g/dL (ref 31.5–35.7)
MCV: 87 fL (ref 79–97)
NRBC: 1 % — ABNORMAL HIGH (ref 0–0)
Platelets: 370 10*3/uL (ref 150–450)
RBC: 4.44 x10E6/uL (ref 4.14–5.80)
RDW: 15.9 % — ABNORMAL HIGH (ref 11.6–15.4)
WBC: 9.2 10*3/uL (ref 3.4–10.8)

## 2023-02-28 NOTE — Telephone Encounter (Signed)
Yes. And for an additional 5 days after finishing paxlovid.

## 2023-02-28 NOTE — Telephone Encounter (Signed)
I spoke with patient wife, Bobby Thomas, on [place]. She held his Rosuvastatin last night and will continue to hold it while he is on Paxlovid AND  an additional 5 days after he has finished the Paxlovid.  They will call back if they have any further questions.

## 2023-03-04 ENCOUNTER — Ambulatory Visit
Admission: RE | Admit: 2023-03-04 | Discharge: 2023-03-04 | Disposition: A | Discharge status: Home / Self Care | Payer: HMO | Source: Ambulatory Visit | Attending: Family Medicine | Admitting: Family Medicine

## 2023-03-04 ENCOUNTER — Telehealth: Payer: Self-pay | Admitting: Family Medicine

## 2023-03-04 DIAGNOSIS — Z79899 Other long term (current) drug therapy: Secondary | ICD-10-CM | POA: Insufficient documentation

## 2023-03-04 DIAGNOSIS — J439 Emphysema, unspecified: Secondary | ICD-10-CM | POA: Diagnosis not present

## 2023-03-04 DIAGNOSIS — R0781 Pleurodynia: Secondary | ICD-10-CM | POA: Insufficient documentation

## 2023-03-04 DIAGNOSIS — I700 Atherosclerosis of aorta: Secondary | ICD-10-CM | POA: Insufficient documentation

## 2023-03-04 DIAGNOSIS — F419 Anxiety disorder, unspecified: Secondary | ICD-10-CM | POA: Insufficient documentation

## 2023-03-04 DIAGNOSIS — J9809 Other diseases of bronchus, not elsewhere classified: Secondary | ICD-10-CM | POA: Diagnosis not present

## 2023-03-04 DIAGNOSIS — R531 Weakness: Secondary | ICD-10-CM | POA: Insufficient documentation

## 2023-03-04 DIAGNOSIS — Z8673 Personal history of transient ischemic attack (TIA), and cerebral infarction without residual deficits: Secondary | ICD-10-CM | POA: Diagnosis not present

## 2023-03-04 DIAGNOSIS — R918 Other nonspecific abnormal finding of lung field: Secondary | ICD-10-CM | POA: Diagnosis not present

## 2023-03-04 DIAGNOSIS — W1800XA Striking against unspecified object with subsequent fall, initial encounter: Secondary | ICD-10-CM | POA: Insufficient documentation

## 2023-03-04 DIAGNOSIS — Z7984 Long term (current) use of oral hypoglycemic drugs: Secondary | ICD-10-CM | POA: Insufficient documentation

## 2023-03-04 DIAGNOSIS — Z87891 Personal history of nicotine dependence: Secondary | ICD-10-CM | POA: Diagnosis not present

## 2023-03-04 DIAGNOSIS — Z85828 Personal history of other malignant neoplasm of skin: Secondary | ICD-10-CM | POA: Insufficient documentation

## 2023-03-04 DIAGNOSIS — J432 Centrilobular emphysema: Secondary | ICD-10-CM | POA: Diagnosis not present

## 2023-03-04 DIAGNOSIS — Z7901 Long term (current) use of anticoagulants: Secondary | ICD-10-CM | POA: Diagnosis not present

## 2023-03-04 DIAGNOSIS — E1122 Type 2 diabetes mellitus with diabetic chronic kidney disease: Secondary | ICD-10-CM | POA: Insufficient documentation

## 2023-03-04 DIAGNOSIS — R590 Localized enlarged lymph nodes: Secondary | ICD-10-CM | POA: Diagnosis not present

## 2023-03-04 DIAGNOSIS — N189 Chronic kidney disease, unspecified: Secondary | ICD-10-CM | POA: Diagnosis not present

## 2023-03-04 DIAGNOSIS — E785 Hyperlipidemia, unspecified: Secondary | ICD-10-CM | POA: Diagnosis not present

## 2023-03-04 DIAGNOSIS — I129 Hypertensive chronic kidney disease with stage 1 through stage 4 chronic kidney disease, or unspecified chronic kidney disease: Secondary | ICD-10-CM | POA: Insufficient documentation

## 2023-03-04 DIAGNOSIS — I672 Cerebral atherosclerosis: Secondary | ICD-10-CM | POA: Diagnosis not present

## 2023-03-04 DIAGNOSIS — R55 Syncope and collapse: Secondary | ICD-10-CM | POA: Diagnosis not present

## 2023-03-04 NOTE — Telephone Encounter (Signed)
Wife given imaging results, verbalizes understanding. States "I need to be notified of any results because sometimes he doesn't understand."

## 2023-03-04 NOTE — Addendum Note (Signed)
Addended by: Bing Neighbors on: 03/04/2023 11:01 AM   Modules accepted: Orders

## 2023-03-05 NOTE — Telephone Encounter (Signed)
Copied from CRM [id number]. Topic: General - Call Back - No Documentation >> Mar 04, 2023 11:36 AM Shon Hale wrote: Reason for CRM: Wife states she missed a call from office a few minutes ago. No documentation seen. Wife requesting a call back on mobile number as she is currently at work.

## 2023-03-06 NOTE — Telephone Encounter (Signed)
PA sent to plan

## 2023-03-10 ENCOUNTER — Other Ambulatory Visit: Payer: Self-pay | Admitting: Pharmacist

## 2023-03-10 DIAGNOSIS — E1122 Type 2 diabetes mellitus with diabetic chronic kidney disease: Secondary | ICD-10-CM

## 2023-03-10 NOTE — Progress Notes (Unsigned)
   03/10/2023 Name: Bobby Thomas MRN: [id number] DOB: Aug 24, 1940  Chief Complaint  Patient presents with   Medication Assistance   Medication Management    Bobby Thomas is a 83 y.o. year old male who presented for a telephone visit.   They were referred to the pharmacist by their PCP for assistance in managing diabetes and medication access.      Subjective:   Care Team: Primary Care Provider: Malva Limes, MD ; Next Scheduled Visit: 03/12/2023 Nephrologist: Bobby Dowdy, MD Cardiologist: Bobby Iba, MD; Next Scheduled Visit: 03/31/2023  Medication Access/Adherence  Current Pharmacy:  [facility] [address], [place] - [address] [place] [address] Phone: [phone] Fax: [phone]  [facility] - [place], [place] - [address] [place] [address] Phone: [phone] Fax: [phone]   Patient reports affordability concerns with their medications: Yes  Patient reports access/transportation concerns to their pharmacy: No  Patient reports adherence concerns with their medications:  No     Weekly pillbox as filled by spouse. Reports rarely misses doses  Reports received a letter from patient's [employer] Medicare plan stating that patient's Rybelsus prescription was either not covered under his plan or required additional steps for approval     Diabetes:   Current medications:  - Farxiga 10 mg daily - glipizide 10 mg twice daily before breakfast and supper (30 minutes prior) - pioglitazone 45 mg daily - Rybelsus 3 mg daily Reports tolerating well Taking on an empty stomach, >=30 minutes before the first food, beverage, or other oral medications of the day with <=4 oz of plain water only   Previous therapies tried/barriers: patient prefers to avoid injectable meditations; Janumet (CKD)   Current glucose readings: elevated in 200s since diagnosed with COVID-19  - Reports feeling better since  completed course of Paxlovid, but still fatigued - Patient scheduled to follow up with PCP on 2/5   Patient denies hypoglycemic s/sx including dizziness, shakiness, sweating.    Reports continues to have regular well-balanced meals   Current physical activity: limited recently due to fatigue   Statin therapy: rosuvastatin 40 mg daily + ezetimibe 10 mg daily   Current medication access support:  - Enrolled in patient assistance for Farxiga from [place] through 02/04/2024 - Interested in applying for patient assistance for Rybelsus from [employer]   Objective:  Lab Results  Component Value Date   HGBA1C 9.2 (H) 02/14/2023    Lab Results  Component Value Date   CREATININE 2.51 (H) 02/27/2023   BUN 53 (H) 02/27/2023   NA 136 02/27/2023   K 4.6 02/27/2023   CL 94 (L) 02/27/2023   CO2 22 02/27/2023    Lab Results  Component Value Date   CHOL 99 (L) 02/14/2023   HDL 45 02/14/2023   LDLCALC 31 02/14/2023   TRIG 133 02/14/2023   CHOLHDL 2.2 02/14/2023    Medications Reviewed Today   Medications were not reviewed in this encounter       Assessment/Plan:   {Pharmacy A/P Choices:26421}  Follow Up Plan: ***  ***

## 2023-03-12 ENCOUNTER — Encounter: Payer: Self-pay | Admitting: Family Medicine

## 2023-03-12 ENCOUNTER — Other Ambulatory Visit: Payer: Self-pay | Admitting: Family Medicine

## 2023-03-12 ENCOUNTER — Ambulatory Visit (INDEPENDENT_AMBULATORY_CARE_PROVIDER_SITE_OTHER): Payer: HMO | Admitting: Family Medicine

## 2023-03-12 VITALS — BP 111/64 | HR 83 | Ht 68.0 in | Wt 165.6 lb

## 2023-03-12 DIAGNOSIS — E1122 Type 2 diabetes mellitus with diabetic chronic kidney disease: Secondary | ICD-10-CM

## 2023-03-12 DIAGNOSIS — I69322 Dysarthria following cerebral infarction: Secondary | ICD-10-CM | POA: Diagnosis not present

## 2023-03-12 DIAGNOSIS — Z7984 Long term (current) use of oral hypoglycemic drugs: Secondary | ICD-10-CM | POA: Diagnosis not present

## 2023-03-12 DIAGNOSIS — R911 Solitary pulmonary nodule: Secondary | ICD-10-CM | POA: Diagnosis not present

## 2023-03-12 MED ORDER — RYBELSUS 3 MG PO TABS: 6.0000 mg | ORAL_TABLET | Freq: Every day | ORAL | Status: DC

## 2023-03-12 NOTE — Progress Notes (Signed)
 Established patient visit   [name]   DOB: December 04, 1940   83 y.o. Male  MRN: [id number] Visit Date: 03/12/2023  Today's healthcare provider: Nancyann Perry, MD   Chief Complaint  Patient presents with   Medical Management of Chronic Issues    Hemoptysis follow-up. Last seen 02/27/23. Pt reports things are getting better and would like to further discuss results today.    Subjective    HPI Patient presents to follow up visit for hemoptysis 1/23 and positive covid test. Has since had lung CT showing apical lung nodule  concerning for primary malignancy. Is scheduled to see oncology on 2/10. Is still cough but greatly improved.   He also had head CT due to a fall and head injury. 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   Lab Results  Component Value Date   CHOL 99 (L) 02/14/2023   HDL 45 02/14/2023   LDLCALC 31 02/14/2023   TRIG 133 02/14/2023   CHOLHDL 2.2 02/14/2023   Lab Results  Component Value Date   NA 136 02/27/2023   K 4.6 02/27/2023   CREATININE 2.51 (H) 02/27/2023   EGFR 25 (L) 02/27/2023   GLUCOSE 177 (H) 02/27/2023   His wife also reports recent increase in blood changes. Was recently changed from janumet  to 3mg  Rybelsus  which he is tolerating well.   Medications: Outpatient Medications Prior to Visit  Medication Sig   apixaban  (ELIQUIS ) 2.5 MG TABS tablet Take 1 tablet (2.5 mg total) by mouth 2 (two) times daily.   dapagliflozin  propanediol (FARXIGA ) 10 MG TABS tablet Take 1 tablet (10 mg total) by mouth daily before breakfast. For [place].   EPINEPHrine  0.3 mg/0.3 mL IJ SOAJ injection Inject 0.3 mg into the muscle as needed.   ezetimibe  (ZETIA ) 10 MG tablet TAKE 1 TABLET BY MOUTH EVERY DAY   furosemide (LASIX) 40 MG  tablet Take 40 mg by mouth daily.   glipiZIDE  (GLUCOTROL ) 10 MG tablet Take 1 tablet (10 mg total) by mouth 2 (two) times daily before a meal.   LORazepam  (ATIVAN ) 1 MG tablet TAKE 1/2 TO 1 TABLET BY MOUTH NIGHTLY AT BEDTIME   metoprolol  succinate (TOPROL -XL) 25 MG 24 hr tablet Take 1 tablet (25 mg total) by mouth daily.   ONETOUCH ULTRA test strip USE TO CHECK BLOOD SUGAR DAILY FOR TYPE 2 DIABETES DX: E11.9   pioglitazone  (ACTOS ) 45 MG tablet TAKE 1 TABLET BY MOUTH EVERY DAY   rosuvastatin  (CRESTOR ) 40 MG tablet TAKE 1 TABLET BY MOUTH EVERY DAY   [DISCONTINUED] Semaglutide  (RYBELSUS ) 3 MG TABS Take 1 tablet (3 mg total) by mouth daily. Take in place of Janumet    [DISCONTINUED] lidocaine  (LIDODERM ) 5 % Place 1 patch onto the skin daily. Remove & Discard patch within 12 hours or as directed by MD   No facility-administered medications prior to visit.    Review of Systems  Constitutional:  Negative for appetite change, chills and fever.  Respiratory:  Negative for chest tightness, shortness of breath and wheezing.   Cardiovascular:  Negative for chest pain and palpitations.  Gastrointestinal:  Negative for abdominal pain, nausea and vomiting.       Objective    BP 111/64 (BP Location: Right Arm, Patient Position: Sitting, Cuff Size: Normal)  Pulse 83   Ht 5' 8 (1.727 m)   Wt 165 lb 9.6 oz (75.1 kg)   SpO2 97%   BMI 25.18 kg/m    Physical Exam   General: Appearance:    Well developed, well nourished male in no acute distress  Eyes:    PERRL, conjunctiva/corneas clear, EOM's intact       Lungs:     Clear to auscultation bilaterally, respirations unlabored  Heart:    Normal heart rate. Irregularly irregular rhythm. No murmurs, rubs, or gallops.    MS:   All extremities are intact.    Neurologic:   Awake, alert, oriented x 3. Moderate dysarthria unchanged from baseline.         Assessment & Plan     1. Lung nodule (Primary) Multidisciplinary follow up recommended by  radiology. Is already scheduled to see oncology next week.   2. Type 2 diabetes mellitus with chronic kidney disease, without long-term current use of insulin , unspecified CKD stage (HCC) Tolerating initiation of Rybelsus . To double up to take 2 tablets (6 mg total) by mouth daily until current bottle out, then change to 7mg  tablets.   3. 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   - Ambulatory referral to Neurology   Return in about 2 months (around 05/10/2023). To check a1c        Nancyann Perry, MD  [facility] [phone] (phone) [phone] (fax)  [facility]

## 2023-03-12 NOTE — Patient Instructions (Signed)
 Marland Kitchen  Please review the attached list of medications and notify my office if there are any errors.   . Please bring all of your medications to every appointment so we can make sure that our medication list is the same as yours.

## 2023-03-13 MED ORDER — RYBELSUS 7 MG PO TABS: 7.0000 mg | ORAL_TABLET | Freq: Every day | ORAL | 2 refills | Status: DC

## 2023-03-14 ENCOUNTER — Telehealth: Payer: Self-pay

## 2023-03-14 NOTE — Telephone Encounter (Signed)
 PAP: Patient assistance application for Rybelsus  through [employer] has been mailed to pt's home address on file. Provider portion of application was faxed to provider's office.

## 2023-03-16 ENCOUNTER — Other Ambulatory Visit: Payer: Self-pay | Admitting: Family Medicine

## 2023-03-16 DIAGNOSIS — E1121 Type 2 diabetes mellitus with diabetic nephropathy: Secondary | ICD-10-CM

## 2023-03-17 ENCOUNTER — Inpatient Hospital Stay: Payer: HMO | Attending: Oncology | Admitting: Oncology

## 2023-03-17 ENCOUNTER — Inpatient Hospital Stay: Payer: HMO

## 2023-03-17 ENCOUNTER — Encounter: Payer: Self-pay | Admitting: Oncology

## 2023-03-17 ENCOUNTER — Encounter: Payer: Self-pay | Admitting: *Deleted

## 2023-03-17 VITALS — BP 121/65 | HR 64 | Temp 96.0°F | Resp 18 | Wt 167.7 lb

## 2023-03-17 DIAGNOSIS — E785 Hyperlipidemia, unspecified: Secondary | ICD-10-CM | POA: Insufficient documentation

## 2023-03-17 DIAGNOSIS — Z7901 Long term (current) use of anticoagulants: Secondary | ICD-10-CM | POA: Diagnosis not present

## 2023-03-17 DIAGNOSIS — R042 Hemoptysis: Secondary | ICD-10-CM | POA: Diagnosis not present

## 2023-03-17 DIAGNOSIS — Z85828 Personal history of other malignant neoplasm of skin: Secondary | ICD-10-CM | POA: Insufficient documentation

## 2023-03-17 DIAGNOSIS — Z7984 Long term (current) use of oral hypoglycemic drugs: Secondary | ICD-10-CM | POA: Insufficient documentation

## 2023-03-17 DIAGNOSIS — I700 Atherosclerosis of aorta: Secondary | ICD-10-CM | POA: Diagnosis not present

## 2023-03-17 DIAGNOSIS — R0789 Other chest pain: Secondary | ICD-10-CM | POA: Insufficient documentation

## 2023-03-17 DIAGNOSIS — Z8673 Personal history of transient ischemic attack (TIA), and cerebral infarction without residual deficits: Secondary | ICD-10-CM | POA: Diagnosis not present

## 2023-03-17 DIAGNOSIS — Z87891 Personal history of nicotine dependence: Secondary | ICD-10-CM | POA: Diagnosis not present

## 2023-03-17 DIAGNOSIS — G319 Degenerative disease of nervous system, unspecified: Secondary | ICD-10-CM | POA: Insufficient documentation

## 2023-03-17 DIAGNOSIS — E1122 Type 2 diabetes mellitus with diabetic chronic kidney disease: Secondary | ICD-10-CM | POA: Insufficient documentation

## 2023-03-17 DIAGNOSIS — I129 Hypertensive chronic kidney disease with stage 1 through stage 4 chronic kidney disease, or unspecified chronic kidney disease: Secondary | ICD-10-CM | POA: Insufficient documentation

## 2023-03-17 DIAGNOSIS — Z79899 Other long term (current) drug therapy: Secondary | ICD-10-CM | POA: Insufficient documentation

## 2023-03-17 DIAGNOSIS — R918 Other nonspecific abnormal finding of lung field: Secondary | ICD-10-CM

## 2023-03-17 DIAGNOSIS — N189 Chronic kidney disease, unspecified: Secondary | ICD-10-CM | POA: Diagnosis not present

## 2023-03-17 DIAGNOSIS — J432 Centrilobular emphysema: Secondary | ICD-10-CM | POA: Insufficient documentation

## 2023-03-17 DIAGNOSIS — J329 Chronic sinusitis, unspecified: Secondary | ICD-10-CM | POA: Insufficient documentation

## 2023-03-17 DIAGNOSIS — W19XXXA Unspecified fall, initial encounter: Secondary | ICD-10-CM | POA: Diagnosis not present

## 2023-03-17 NOTE — Assessment & Plan Note (Signed)
 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  He is on low dose Eliquis  2.5mg  BID. Declined pulmonology evaluation.  Monitor symptoms.

## 2023-03-17 NOTE — Progress Notes (Signed)
 Met with patient during initial consult with Dr. Wilhelmenia Harada. All questions answered during visit. Reviewed upcoming appts with patient. Pt requested to hold referral to pulmonary at this time until results from brain MRI and PET scan have been reviewed with them. Informed that will call with results once available to discuss next steps. Contact info given and instructed to call with any questions or needs. Pt verbalized understanding.
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
## 2023-03-17 NOTE — Assessment & Plan Note (Addendum)
 Image findings were reviewed with patient and his wife.  Large left upper lobe/supra hilar mass with enlarged subcarinal lymphadenopathy.  Suspect underlying malignancy.  Recommend MRI brain w wo contrast and PET scan for staging.  Recommend tissue biopsy. If no other peripheral/more accessible lesion found on PET, recommend pulmonology referral for evaluation of biopsy via bronchoscopy. Patient declined pulmonology referral for now and would like to see his image workup results first

## 2023-03-19 ENCOUNTER — Ambulatory Visit
Admission: RE | Admit: 2023-03-19 | Discharge: 2023-03-19 | Disposition: A | Discharge status: Home / Self Care | Payer: HMO | Source: Ambulatory Visit | Attending: Oncology | Admitting: Oncology

## 2023-03-19 ENCOUNTER — Encounter: Payer: Self-pay | Admitting: Radiology

## 2023-03-19 DIAGNOSIS — I6782 Cerebral ischemia: Secondary | ICD-10-CM | POA: Diagnosis not present

## 2023-03-19 DIAGNOSIS — I671 Cerebral aneurysm, nonruptured: Secondary | ICD-10-CM | POA: Diagnosis not present

## 2023-03-19 DIAGNOSIS — R918 Other nonspecific abnormal finding of lung field: Secondary | ICD-10-CM | POA: Diagnosis not present

## 2023-03-19 DIAGNOSIS — G9389 Other specified disorders of brain: Secondary | ICD-10-CM | POA: Diagnosis not present

## 2023-03-19 MED ORDER — GADOBUTROL 1 MMOL/ML IV SOLN
7.0000 mL | Freq: Once | INTRAVENOUS | Status: AC | PRN
  Administered 2023-03-19: 7 mL via INTRAVENOUS

## 2023-03-21 ENCOUNTER — Telehealth: Payer: Self-pay

## 2023-03-21 ENCOUNTER — Inpatient Hospital Stay: Payer: HMO

## 2023-03-21 ENCOUNTER — Encounter
Admission: RE | Admit: 2023-03-21 | Discharge: 2023-03-21 | Disposition: A | Discharge status: Home / Self Care | Payer: HMO | Source: Ambulatory Visit | Attending: Oncology | Admitting: Oncology

## 2023-03-21 DIAGNOSIS — R918 Other nonspecific abnormal finding of lung field: Secondary | ICD-10-CM | POA: Insufficient documentation

## 2023-03-21 LAB — GLUCOSE, CAPILLARY: Glucose-Capillary: 186 mg/dL — ABNORMAL HIGH (ref 70–99)

## 2023-03-21 MED ORDER — FLUDEOXYGLUCOSE F - 18 (FDG) INJECTION
9.4400 | Freq: Once | INTRAVENOUS | Status: AC | PRN
  Administered 2023-03-21: 9.44 via INTRAVENOUS

## 2023-03-21 NOTE — Progress Notes (Signed)
[facility] CSW Progress Note  Patient's wife, Sallye Ober, called Visual merchandiser.  She confirmed that she was not able to meet today to complete patient's Advance Directives.  She stated she will have an appointment with Dr. Cathie Hoops in the future and will schedule with CSW them.  She expressed no other needs.    Dorothey Baseman, LCSW Clinical Social Worker [facility]

## 2023-03-21 NOTE — Telephone Encounter (Signed)
Clinical Social Worker was to meet with patient and his wife, Bobby Thomas, today after his PET scan to complete Advance Directives.  Louise left a voicemail stating she would have to call CSW and reschedule.

## 2023-03-26 ENCOUNTER — Inpatient Hospital Stay: Payer: HMO

## 2023-03-28 ENCOUNTER — Encounter: Payer: Self-pay | Admitting: *Deleted

## 2023-03-28 DIAGNOSIS — R918 Other nonspecific abnormal finding of lung field: Secondary | ICD-10-CM

## 2023-03-28 NOTE — Progress Notes (Signed)
 Called Bobby Thomas and his wife to review PET and MRI results. Dr. Cathie Hoops reviewed and recommended referral to pulmonary for bronchoscopy. Reviewed recommendations and Bobby Thomas in agreement with proceeding with referral. Referral entered and informed to expect a call with his appt for pulmonary consultation. All questions answered during call. Instructed to call back with any questions or needs. Bobby Thomas verbalized understanding. Nothing further needed at this time.
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
## 2023-03-31 ENCOUNTER — Encounter: Payer: Self-pay | Admitting: Cardiovascular Disease

## 2023-03-31 ENCOUNTER — Encounter: Payer: Self-pay | Admitting: Pulmonary Disease

## 2023-03-31 ENCOUNTER — Ambulatory Visit: Payer: HMO | Attending: Cardiovascular Disease | Admitting: Cardiovascular Disease

## 2023-03-31 ENCOUNTER — Ambulatory Visit: Payer: HMO | Admitting: Pulmonary Disease

## 2023-03-31 VITALS — BP 118/62 | HR 69 | Temp 96.9°F | Ht 67.0 in | Wt 166.8 lb

## 2023-03-31 VITALS — BP 100/58 | HR 67 | Ht 67.0 in | Wt 168.1 lb

## 2023-03-31 DIAGNOSIS — I4821 Permanent atrial fibrillation: Secondary | ICD-10-CM

## 2023-03-31 DIAGNOSIS — I6521 Occlusion and stenosis of right carotid artery: Secondary | ICD-10-CM

## 2023-03-31 DIAGNOSIS — R0602 Shortness of breath: Secondary | ICD-10-CM

## 2023-03-31 DIAGNOSIS — R918 Other nonspecific abnormal finding of lung field: Secondary | ICD-10-CM

## 2023-03-31 DIAGNOSIS — E782 Mixed hyperlipidemia: Secondary | ICD-10-CM | POA: Diagnosis not present

## 2023-03-31 DIAGNOSIS — I2510 Atherosclerotic heart disease of native coronary artery without angina pectoris: Secondary | ICD-10-CM

## 2023-03-31 DIAGNOSIS — N184 Chronic kidney disease, stage 4 (severe): Secondary | ICD-10-CM

## 2023-03-31 DIAGNOSIS — I739 Peripheral vascular disease, unspecified: Secondary | ICD-10-CM | POA: Diagnosis not present

## 2023-03-31 DIAGNOSIS — I10 Essential (primary) hypertension: Secondary | ICD-10-CM

## 2023-03-31 DIAGNOSIS — E1122 Type 2 diabetes mellitus with diabetic chronic kidney disease: Secondary | ICD-10-CM

## 2023-03-31 HISTORY — DX: Atherosclerotic heart disease of native coronary artery without angina pectoris: I25.10

## 2023-03-31 HISTORY — DX: Peripheral vascular disease, unspecified: I73.9

## 2023-03-31 HISTORY — DX: Other nonspecific abnormal finding of lung field: R91.8

## 2023-03-31 MED ORDER — EZETIMIBE 10 MG PO TABS: 10.0000 mg | ORAL_TABLET | Freq: Every day | ORAL | 3 refills | Status: DC

## 2023-03-31 MED ORDER — ROSUVASTATIN CALCIUM 40 MG PO TABS: 40.0000 mg | ORAL_TABLET | Freq: Every day | ORAL | 3 refills | Status: DC

## 2023-03-31 MED ORDER — APIXABAN 2.5 MG PO TABS: 2.5000 mg | ORAL_TABLET | Freq: Two times a day (BID) | ORAL | Status: DC

## 2023-03-31 NOTE — Patient Instructions (Signed)

## 2023-03-31 NOTE — Progress Notes (Signed)
 Synopsis: Referred in by Bobby Patience, MD   Subjective:   PATIENT ID: Bobby Thomas GENDER: male DOB: 27-Dec-1940, MRN: [id number]  Chief Complaint  Patient presents with   Consult    No SOB or wheezing. Occasional cough with red sputum.    HPI [name] is an 83 year old male patient with a past medical history of chronic A-fib on Eliquis, PAD status post right ICA stent, CVA in 2007, hypertension, type 2 diabetes mellitus presenting today to the pulmonary clinic with his wife for left lung mass evaluation.  He reports that about 3 to 4 weeks ago he started having left rib pain associated with hemoptysis which prompted his primary care physician to obtain a CT of the chest that revealed a left upper lobe lung mass.  He subsequently saw Dr. Cathie Hoops and underwent a PET scan that showed hypermetabolic activity in the left upper lobe lung mass without any findings of metastatic disease.  MRI brain without contrast 02/21 without any signs of metastatic disease.  His main symptoms include fatigue lethargy cough and hemoptysis.  Family history -no family history of cancer.  Social history -quit smoking in 2015.  Smoked 2 pack/day since his teens.  Does drink beer occasionally.  ROS All systems were reviewed and are negative except for the above. Objective:   Vitals:   03/31/23 1421  BP: 118/62  Pulse: 69  Temp: (!) 96.9 F (36.1 C)  SpO2: 95%  Weight: 166 lb 12.8 oz (75.7 kg)  Height: 5\' 7"  (1.702 m)   95% on RA BMI Readings from Last 3 Encounters:  03/31/23 26.12 kg/m  03/31/23 26.33 kg/m  03/17/23 25.50 kg/m   Wt Readings from Last 3 Encounters:  03/31/23 166 lb 12.8 oz (75.7 kg)  03/31/23 168 lb 2 oz (76.3 kg)  03/17/23 167 lb 11.2 oz (76.1 kg)    Physical Exam GEN: NAD HEENT: Supple Neck, Reactive Pupils, EOMI  CVS: Normal S1, Normal S2, irregularly irregular rhythm Lungs: Clear bilateral air entry.  Abdomen: Soft, non tender, non distended, + BS  Extremities:  Warm and well perfused, No edema   Labs and imaging were reviewed.  Ancillary Information   CBC    Component Value Date/Time   WBC 9.2 02/27/2023 1428   WBC 10.5 12/30/2018 0404   RBC 4.44 02/27/2023 1428   RBC 4.25 12/30/2018 0404   HGB 12.7 (L) 02/27/2023 1428   HCT 38.5 02/27/2023 1428   PLT 370 02/27/2023 1428   MCV 87 02/27/2023 1428   MCV 91 08/07/2011 0737   MCH 28.6 02/27/2023 1428   MCH 29.9 12/30/2018 0404   MCHC 33.0 02/27/2023 1428   MCHC 31.9 12/30/2018 0404   RDW 15.9 (H) 02/27/2023 1428   RDW 14.8 (H) 08/07/2011 0737   LYMPHSABS 2.6 05/10/2021 1122   LYMPHSABS 2.8 08/07/2011 0737   MONOABS 1.2 (H) 08/07/2011 0737   EOSABS 0.3 05/10/2021 1122   EOSABS 0.2 08/07/2011 0737   BASOSABS 0.1 05/10/2021 1122   BASOSABS 0.1 08/07/2011 0737       No data to display           Assessment & Plan:  [name] is an 83 year old male patient with a past medical history of chronic A-fib on Eliquis, PAD status post right ICA stent, CVA in 2007, hypertension, type 2 diabetes mellitus presenting today to the pulmonary clinic with his wife for left lung mass evaluation.  # Left upper lobe lung mass measuring 11 cm. Current clinical stage  T4 N0 M0 - Stage IIIA   []  Plan for RANB, EBUS TBNA on 03/06  []  CT superD on 03/04 []  Will discuss discontinuing Eliquis with his cardiologist given ongoing hemoptysis.  []  Obtain PFTs   Return in about 3 months (around 06/28/2023).  I spent 60 minutes caring for this patient today, including preparing to see the patient, obtaining a medical history , reviewing a separately obtained history, performing a medically appropriate examination and/or evaluation, counseling and educating the patient/family/caregiver, ordering medications, tests, or procedures, documenting clinical information in the electronic health record, and independently interpreting results (not separately reported/billed) and communicating results to the  patient/family/caregiver  Janann Colonel, MD [facility] 03/31/2023 3:12 PM

## 2023-04-01 ENCOUNTER — Telehealth: Payer: Self-pay

## 2023-04-01 NOTE — Telephone Encounter (Signed)
 Synetta Fail please see bronch info. Also will you schedule CT and let the patient know dates and times of his Procedure as well. Thank you!

## 2023-04-01 NOTE — Telephone Encounter (Signed)
 For the code [id number] Auth # [id number] valid 04/01/2023 to 06/30/2023

## 2023-04-01 NOTE — Telephone Encounter (Signed)
 [name] 04/10/2023 at 9:15am. CPT- 31627 DX: Lung Nodule

## 2023-04-02 ENCOUNTER — Encounter: Payer: Self-pay | Admitting: Pulmonary Disease

## 2023-04-02 ENCOUNTER — Inpatient Hospital Stay (HOSPITAL_BASED_OUTPATIENT_CLINIC_OR_DEPARTMENT_OTHER): Payer: HMO | Admitting: Hospice and Palliative Medicine

## 2023-04-02 DIAGNOSIS — R918 Other nonspecific abnormal finding of lung field: Secondary | ICD-10-CM

## 2023-04-02 NOTE — Progress Notes (Signed)
 Multidisciplinary Oncology Council Documentation  Bobby Thomas was presented by our [facility] on 04/02/2023, which included representatives from:  Palliative Care Dietitian  Physical/Occupational Therapist Nurse Navigator Genetics Social work Survivorship RN Financial Navigator Research RN   Bobby Thomas currently presents with history of lung mass  We reviewed previous medical and familial history, history of present illness, and recent lab results along with all available histopathologic and imaging studies. The MOC considered available treatment options and made the following recommendations/referrals:  SW  The MOC is a meeting of clinicians from various specialty areas who evaluate and discuss patients for whom a multidisciplinary approach is being considered. Final determinations in the plan of care are those of the provider(s).   Today's extended care, comprehensive team conference, Bobby Thomas was not present for the discussion and was not examined.

## 2023-04-02 NOTE — Telephone Encounter (Signed)
 Patient is aware of all appt date and times.   Nothing further needed.

## 2023-04-03 ENCOUNTER — Inpatient Hospital Stay: Payer: HMO

## 2023-04-03 NOTE — Progress Notes (Signed)
 [facility] CSW Progress Note  Clinical Child psychotherapist contacted caregiver by phone to discuss advance directives.  CSW offered to meet with patient and his wife, Sallye Ober, after his appointment with Dr. Cathie Hoops on 3/13 at 8:45.  Louise expressed no other needs at this time.    Dorothey Baseman, LCSW Clinical Social Worker [facility]

## 2023-04-08 ENCOUNTER — Ambulatory Visit
Admission: RE | Admit: 2023-04-08 | Discharge: 2023-04-08 | Disposition: A | Discharge status: Home / Self Care | Payer: HMO | Source: Ambulatory Visit | Attending: Pulmonary Disease | Admitting: Pulmonary Disease

## 2023-04-08 ENCOUNTER — Telehealth: Payer: Self-pay

## 2023-04-08 ENCOUNTER — Encounter (HOSPITAL_COMMUNITY): Payer: Self-pay | Admitting: Pulmonary Disease

## 2023-04-08 ENCOUNTER — Other Ambulatory Visit: Payer: Self-pay

## 2023-04-08 DIAGNOSIS — R0602 Shortness of breath: Secondary | ICD-10-CM | POA: Diagnosis not present

## 2023-04-08 DIAGNOSIS — R918 Other nonspecific abnormal finding of lung field: Secondary | ICD-10-CM | POA: Diagnosis not present

## 2023-04-08 DIAGNOSIS — J432 Centrilobular emphysema: Secondary | ICD-10-CM | POA: Diagnosis not present

## 2023-04-08 DIAGNOSIS — Z01818 Encounter for other preprocedural examination: Secondary | ICD-10-CM | POA: Diagnosis not present

## 2023-04-08 NOTE — Telephone Encounter (Signed)
 Dr Mariah Milling may be the one Rx'ing Eliquis to this patient. Needs to hold for 3 days prior to procedure, but can check with cardiology as well. We are closer than 3 days to his procedure now, though.
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
## 2023-04-08 NOTE — Telephone Encounter (Signed)
 Pts wife reports he stopped taking last night and she spoke with cardio who also advised her that he should stop taking farxiga until after surgery and glipizide he should take tomorrow morning, not tomorrow evening or the next morning.

## 2023-04-08 NOTE — Progress Notes (Signed)
 PCP - Dr Mila Merry Cardiologist - Dr Julien Nordmann  CT Chest x-ray - 04/08/23 EKG - 03/31/23 Stress Test - 01/13/06 ECHO - 03/03/11 Cardiac Cath - n/a  ICD Pacemaker/Loop - n/a  Sleep Study -  n/a  Diabetes Type 2 Fasting Blood Sugar - 185-200s Checks Blood Sugar 1 times a day  Last dose of Semaglutide GLP1 agonist was on 04/08/23. GLP1 instructions: Hold Semaglutide 24 hours prior to procedure.    Hold Farxiga for 72 hours prior to procedure.  Last dose was on 04/08/23.  (SDW call)  Do not take Glipizide on Wednesday evening or Thursday morning (DOS) dose. Last dose 04/09/23 morning.  Do not take Actos on the morning of surgery.  If your blood sugar is less than 70 mg/dL, you will need to treat for low blood sugar: Treat a low blood sugar (less than 70 mg/dL) with  cup of clear juice (cranberry or apple), 4 glucose tablets, OR glucose gel. Recheck blood sugar in 15 minutes after treatment (to make sure it is greater than 70 mg/dL). If your blood sugar is not greater than 70 mg/dL on recheck, call [phone] for further instructions.  Blood Thinner Instructions:  Patient called MD's office for Eliquis instructions for his upcoming 04/07/23 procedure.  Last dose was on 04/07/23 per wife Sallye Ober.  Aspirin Instructions: n/a  NPO   Anesthesia review: Yes  STOP now taking any Aspirin (unless otherwise instructed by your surgeon), Aleve, Naproxen, Ibuprofen, Motrin, Advil, Goody's, BC's, all herbal medications, fish oil, and all vitamins.   Coronavirus Screening Does the patient have any of the following symptoms:  Cough yes/no: No Fever (>100.45F)  yes/no: No Runny nose yes/no: No Sore throat yes/no: No Difficulty breathing/shortness of breath  yes/no: No  Has the patient traveled in the last 14 days and where? yes/no: No  Patient's wife Sallye Ober verbalized understanding of instructions that were given via phone.

## 2023-04-08 NOTE — Telephone Encounter (Signed)
 Copied from CRM [id number]. Topic: Clinical - Medication Question >> Apr 08, 2023  9:21 AM Bobby Thomas wrote: Reason for CRM: pt is having biopsy this Thursday 04/09/2023.  Pt is supposed to stop Eliquis, but has not been told by Dr Sherrie Mustache when that should be.   Please advise asap

## 2023-04-08 NOTE — Anesthesia Preprocedure Evaluation (Signed)
 Anesthesia Evaluation  Patient identified by MRN, date of birth, ID band Patient awake    Reviewed: Allergy & Precautions, NPO status , Patient's Chart, lab work & pertinent test results, reviewed documented beta blocker date and time   History of Anesthesia Complications Negative for: history of anesthetic complications  Airway Mallampati: II  TM Distance: >3 FB     Dental  (+) Edentulous Upper, Partial Lower   Pulmonary former smoker   breath sounds clear to auscultation       Cardiovascular hypertension, + CAD and + Peripheral Vascular Disease  (-) Past MI, (-) Cardiac Stents, (-) CHF and (-) Orthopnea  Rhythm:Regular Rate:Normal     Neuro/Psych neg Seizures PSYCHIATRIC DISORDERS Anxiety Depression    CVA, Residual Symptoms    GI/Hepatic ,neg GERD  ,,(+) neg Cirrhosis        Endo/Other  diabetes, Type 2    Renal/GU CRFRenal disease     Musculoskeletal   Abdominal   Peds  Hematology   Anesthesia Other Findings   Reproductive/Obstetrics                              Anesthesia Physical Anesthesia Plan  ASA: 3  Anesthesia Plan: General   Post-op Pain Management:    Induction: Intravenous  PONV Risk Score and Plan: 2 and Ondansetron and Dexamethasone  Airway Management Planned: Oral ETT  Additional Equipment:   Intra-op Plan:   Post-operative Plan: Extubation in OR  Informed Consent: I have reviewed the patients History and Physical, chart, labs and discussed the procedure including the risks, benefits and alternatives for the proposed anesthesia with the patient or authorized representative who has indicated his/her understanding and acceptance.     Dental advisory given  Plan Discussed with: CRNA  Anesthesia Plan Comments: (PAT note written 04/08/2023 by Shonna Chock, PA-C.  )        Anesthesia Quick Evaluation

## 2023-04-09 ENCOUNTER — Other Ambulatory Visit (HOSPITAL_COMMUNITY): Payer: Self-pay

## 2023-04-09 ENCOUNTER — Other Ambulatory Visit: Payer: Self-pay | Admitting: Pharmacist

## 2023-04-09 DIAGNOSIS — E1122 Type 2 diabetes mellitus with diabetic chronic kidney disease: Secondary | ICD-10-CM

## 2023-04-09 NOTE — Telephone Encounter (Signed)
 PAP: Cancelled by [facility] patient is no longer taking Rybelsus.

## 2023-04-09 NOTE — Progress Notes (Unsigned)
 04/09/2023 Name: Bobby Thomas MRN: [id number] DOB: 1940-07-25  Chief Complaint  Patient presents with   Medication Management   Medication Assistance    PARRISH BONN is a 83 y.o. year old male who presented for a telephone visit.   They were referred to the pharmacist by their PCP for assistance in managing diabetes and medication access.      Subjective:   Care Team: Primary Care Provider: Malva Limes, MD ; Next Scheduled Visit: 05/16/2023 Nephrologist: Lamont Dowdy, MD Cardiologist: Antonieta Iba, MD; Next Scheduled Visit: 03/31/2023 Oncologist: Rickard Patience, MD; Next Scheduled Visit: 04/17/2023  Medication Access/Adherence  Current Pharmacy:  [facility] [address] [place] - [address] [place] [address] Phone: [phone] Fax: [phone]  [facility] - [place], [place] - [address] [place] [address] Phone: [phone] Fax: [phone]   Patient reports affordability concerns with their medications: No  Patient reports access/transportation concerns to their pharmacy: No  Patient reports adherence concerns with their medications:  No     Weekly pillbox as filled by spouse. Reports rarely misses doses  From review of chart/spouse shares patient scheduled for bronchoscopy tomorrow - Reports has and following directions from care team for medications to hold prior to procedure   Diabetes:   Current medications:  - Farxiga 10 mg daily - glipizide 10 mg twice daily before breakfast and supper (30 minutes prior) - pioglitazone 45 mg daily - Self-stopped Rybelsus 7 mg daily Reports stopped as he "did not feel good" on the medication; does not want to restart Rybelsus    Previous therapies tried/barriers: patient prefers to avoid injectable meditations; Janumet (CKD), Rybelsus   Current glucose readings: morning fasting reading ranging: 184-213   Patient denies hypoglycemic s/sx including  dizziness, shakiness, sweating.    Reports continues to have regular well-balanced meals   Current physical activity: limited recently due to fatigue   Statin therapy: rosuvastatin 40 mg daily + ezetimibe 10 mg daily   Current medication access support:  - Enrolled in patient assistance [place] Farxiga from [place] through 02/04/2024     Objective:  Lab Results  Component Value Date   HGBA1C 9.2 (H) 02/14/2023    Lab Results  Component Value Date   CREATININE 2.51 (H) 02/27/2023   BUN 53 (H) 02/27/2023   NA 136 02/27/2023   K 4.6 02/27/2023   CL 94 (L) 02/27/2023   CO2 22 02/27/2023  Calculated CrCl (using adjusted body weight): ~23 ml/min  Lab Results  Component Value Date   CHOL 99 (L) 02/14/2023   HDL 45 02/14/2023   LDLCALC 31 02/14/2023   TRIG 133 02/14/2023   CHOLHDL 2.2 02/14/2023    Medications Reviewed Today     Reviewed by Manuela Neptune, RPH-CPP (Pharmacist) on 04/09/23 at 1242  Med List Status: <None>   Medication Order Taking? Sig Documenting Provider Last Dose Status Informant  apixaban (ELIQUIS) 2.5 MG TABS tablet [id number]  Take 1 tablet (2.5 mg total) by mouth 2 (two) times daily. Antonieta Iba, MD  Active   dapagliflozin propanediol (FARXIGA) 10 MG TABS tablet [id number] Yes Take 1 tablet (10 mg total) by mouth daily before breakfast. For [place]. Malva Limes, MD Taking Active   EPINEPHrine 0.3 mg/0.3 mL IJ SOAJ injection [id number]  Inject 0.3 mg into the muscle as needed. Malva Limes, MD  Active   ezetimibe (  ZETIA) 10 MG tablet [id number]  Take 1 tablet (10 mg total) by mouth daily. Antonieta Iba, MD  Active   furosemide (LASIX) 40 MG tablet [id number]  Take 40 mg by mouth daily. [provider]  Active   glipiZIDE (GLUCOTROL) 10 MG tablet [id number] Yes Take 1 tablet (10 mg total) by mouth 2 (two) times daily before a meal. Malva Limes, MD Taking Active   LORazepam (ATIVAN) 1 MG tablet  [id number]  TAKE 1/2 TO 1 TABLET BY MOUTH NIGHTLY AT BEDTIME Malva Limes, MD  Active   metoprolol succinate (TOPROL-XL) 25 MG 24 hr tablet [id number]  Take 1 tablet (25 mg total) by mouth daily. Malva Limes, MD  Active   [facility] ULTRA test strip [id number]  USE TO CHECK BLOOD SUGAR DAILY FOR TYPE 2 DIABETES DX: E11.9 Malva Limes, MD  Active   pioglitazone (ACTOS) 45 MG tablet [id number] Yes TAKE 1 TABLET BY MOUTH EVERY DAY Malva Limes, MD Taking Active   rosuvastatin (CRESTOR) 40 MG tablet [id number]  Take 1 tablet (40 mg total) by mouth daily. Antonieta Iba, MD  Active               Assessment/Plan:   Note CPhT Sabino Snipes was previously assisting patient with applying for [employer] patient assistance for Rybelsus. Send message to CPhT to request that she contact [employer] to discontinue this application for patient.  Renal dose consideration: note based on latest renal function labs, calculate CrCl (using adjusted body weight): ~23 ml/min.  - Recommended maximum dose for rosuvastatin in patients with CrCl <30 is rosuvastatin 10 mg daily  Send message to PCP to recommend this dose reduction  Diabetes: - Currently uncontrolled - Reviewed long term cardiovascular and renal outcomes of uncontrolled blood sugar - Reviewed goal A1c, goal fasting, and goal 2 hour post prandial glucose - Have reviewed dietary modifications including: - Recommend to check glucose, keep log of results and have this record to review at upcoming medical appointments. Patient to contact provider office sooner if needed for readings outside of established parameters or symptoms - Send message to PCP to collaborate regarding diabetes medication management for patient      Follow Up Plan: Clinical Pharmacist will follow up with patient by telephone within the next 2 weeks   Estelle Grumbles, PharmD, [facility] [phone]

## 2023-04-10 ENCOUNTER — Other Ambulatory Visit: Payer: Self-pay

## 2023-04-10 ENCOUNTER — Encounter (HOSPITAL_COMMUNITY)
Admission: RE | Disposition: A | Discharge status: Home / Self Care | Payer: Self-pay | Source: Home / Self Care | Attending: Pulmonary Disease

## 2023-04-10 ENCOUNTER — Ambulatory Visit (HOSPITAL_BASED_OUTPATIENT_CLINIC_OR_DEPARTMENT_OTHER): Payer: Self-pay | Admitting: Vascular Surgery

## 2023-04-10 ENCOUNTER — Ambulatory Visit (HOSPITAL_COMMUNITY)
Admission: RE | Admit: 2023-04-10 | Discharge: 2023-04-10 | Disposition: A | Discharge status: Home / Self Care | Payer: HMO | Attending: Pulmonary Disease | Admitting: Pulmonary Disease

## 2023-04-10 ENCOUNTER — Ambulatory Visit (HOSPITAL_COMMUNITY): Payer: Self-pay | Admitting: Vascular Surgery

## 2023-04-10 ENCOUNTER — Ambulatory Visit (HOSPITAL_COMMUNITY)

## 2023-04-10 ENCOUNTER — Encounter (HOSPITAL_COMMUNITY): Payer: Self-pay | Admitting: Pulmonary Disease

## 2023-04-10 DIAGNOSIS — E119 Type 2 diabetes mellitus without complications: Secondary | ICD-10-CM | POA: Diagnosis not present

## 2023-04-10 DIAGNOSIS — Z833 Family history of diabetes mellitus: Secondary | ICD-10-CM | POA: Diagnosis not present

## 2023-04-10 DIAGNOSIS — Z79899 Other long term (current) drug therapy: Secondary | ICD-10-CM | POA: Diagnosis not present

## 2023-04-10 DIAGNOSIS — E785 Hyperlipidemia, unspecified: Secondary | ICD-10-CM | POA: Diagnosis not present

## 2023-04-10 DIAGNOSIS — I129 Hypertensive chronic kidney disease with stage 1 through stage 4 chronic kidney disease, or unspecified chronic kidney disease: Secondary | ICD-10-CM | POA: Insufficient documentation

## 2023-04-10 DIAGNOSIS — I2510 Atherosclerotic heart disease of native coronary artery without angina pectoris: Secondary | ICD-10-CM

## 2023-04-10 DIAGNOSIS — I671 Cerebral aneurysm, nonruptured: Secondary | ICD-10-CM | POA: Diagnosis not present

## 2023-04-10 DIAGNOSIS — C3412 Malignant neoplasm of upper lobe, left bronchus or lung: Secondary | ICD-10-CM

## 2023-04-10 DIAGNOSIS — N1832 Chronic kidney disease, stage 3b: Secondary | ICD-10-CM | POA: Diagnosis not present

## 2023-04-10 DIAGNOSIS — F32A Depression, unspecified: Secondary | ICD-10-CM | POA: Diagnosis not present

## 2023-04-10 DIAGNOSIS — Z7901 Long term (current) use of anticoagulants: Secondary | ICD-10-CM | POA: Diagnosis not present

## 2023-04-10 DIAGNOSIS — I4821 Permanent atrial fibrillation: Secondary | ICD-10-CM | POA: Insufficient documentation

## 2023-04-10 DIAGNOSIS — F419 Anxiety disorder, unspecified: Secondary | ICD-10-CM | POA: Diagnosis not present

## 2023-04-10 DIAGNOSIS — E1151 Type 2 diabetes mellitus with diabetic peripheral angiopathy without gangrene: Secondary | ICD-10-CM | POA: Insufficient documentation

## 2023-04-10 DIAGNOSIS — I700 Atherosclerosis of aorta: Secondary | ICD-10-CM | POA: Diagnosis not present

## 2023-04-10 DIAGNOSIS — I10 Essential (primary) hypertension: Secondary | ICD-10-CM

## 2023-04-10 DIAGNOSIS — Z7984 Long term (current) use of oral hypoglycemic drugs: Secondary | ICD-10-CM | POA: Insufficient documentation

## 2023-04-10 DIAGNOSIS — E1122 Type 2 diabetes mellitus with diabetic chronic kidney disease: Secondary | ICD-10-CM | POA: Insufficient documentation

## 2023-04-10 DIAGNOSIS — C3492 Malignant neoplasm of unspecified part of left bronchus or lung: Secondary | ICD-10-CM | POA: Insufficient documentation

## 2023-04-10 DIAGNOSIS — Z48813 Encounter for surgical aftercare following surgery on the respiratory system: Secondary | ICD-10-CM | POA: Diagnosis not present

## 2023-04-10 DIAGNOSIS — Z87891 Personal history of nicotine dependence: Secondary | ICD-10-CM | POA: Diagnosis not present

## 2023-04-10 DIAGNOSIS — I7143 Infrarenal abdominal aortic aneurysm, without rupture: Secondary | ICD-10-CM | POA: Insufficient documentation

## 2023-04-10 DIAGNOSIS — Z8673 Personal history of transient ischemic attack (TIA), and cerebral infarction without residual deficits: Secondary | ICD-10-CM | POA: Insufficient documentation

## 2023-04-10 DIAGNOSIS — N184 Chronic kidney disease, stage 4 (severe): Secondary | ICD-10-CM | POA: Diagnosis not present

## 2023-04-10 DIAGNOSIS — R918 Other nonspecific abnormal finding of lung field: Secondary | ICD-10-CM

## 2023-04-10 HISTORY — PX: BRONCHIAL WASHINGS: SHX5105

## 2023-04-10 HISTORY — DX: Unspecified atrial fibrillation: I48.91

## 2023-04-10 HISTORY — PX: BRONCHIAL BIOPSY: SHX5109

## 2023-04-10 HISTORY — PX: BRONCHIAL BRUSHINGS: SHX5108

## 2023-04-10 HISTORY — DX: Disorder of arteries and arterioles, unspecified: I77.9

## 2023-04-10 HISTORY — PX: FLEXIBLE BRONCHOSCOPY: SHX5094

## 2023-04-10 HISTORY — PX: ENDOBRONCHIAL ULTRASOUND: SHX5096

## 2023-04-10 HISTORY — PX: BRONCHIAL NEEDLE ASPIRATION BIOPSY: SHX5106

## 2023-04-10 LAB — BASIC METABOLIC PANEL
Anion gap: 12 (ref 5–15)
BUN: 52 mg/dL — ABNORMAL HIGH (ref 8–23)
CO2: 23 mmol/L (ref 22–32)
Calcium: 9.2 mg/dL (ref 8.9–10.3)
Chloride: 99 mmol/L (ref 98–111)
Creatinine, Ser: 2.09 mg/dL — ABNORMAL HIGH (ref 0.61–1.24)
GFR, Estimated: 31 mL/min — ABNORMAL LOW (ref >60)
Glucose, Bld: 248 mg/dL — ABNORMAL HIGH (ref 70–99)
Potassium: 4.1 mmol/L (ref 3.5–5.1)
Sodium: 134 mmol/L — ABNORMAL LOW (ref 135–145)

## 2023-04-10 LAB — CBC
HCT: 39.5 % (ref 39.0–52.0)
Hemoglobin: 13 g/dL (ref 13.0–17.0)
MCH: 29.7 pg (ref 26.0–34.0)
MCHC: 32.9 g/dL (ref 30.0–36.0)
MCV: 90.4 fL (ref 80.0–100.0)
Platelets: 360 10*3/uL (ref 150–400)
RBC: 4.37 MIL/uL (ref 4.22–5.81)
RDW: 18 % — ABNORMAL HIGH (ref 11.5–15.5)
WBC: 14.3 10*3/uL — ABNORMAL HIGH (ref 4.0–10.5)
nRBC: 0.8 % — ABNORMAL HIGH (ref 0.0–0.2)

## 2023-04-10 LAB — GLUCOSE, CAPILLARY
Glucose-Capillary: 213 mg/dL — ABNORMAL HIGH (ref 70–99)
Glucose-Capillary: 189 mg/dL — ABNORMAL HIGH (ref 70–99)

## 2023-04-10 SURGERY — BRONCHOSCOPY, FLEXIBLE
Anesthesia: General | Laterality: Bilateral

## 2023-04-10 MED ORDER — ACETAMINOPHEN 10 MG/ML IV SOLN: 1000.0000 mg | Freq: Once | INTRAVENOUS | Status: DC | PRN

## 2023-04-10 MED ORDER — OXYCODONE HCL 5 MG PO TABS: 5.0000 mg | ORAL_TABLET | Freq: Once | ORAL | Status: DC | PRN

## 2023-04-10 MED ORDER — ALBUMIN HUMAN 5 % IV SOLN
INTRAVENOUS | Status: DC | PRN
Start: 2023-04-10 — End: 2023-04-10

## 2023-04-10 MED ORDER — PROPOFOL 10 MG/ML IV BOLUS
INTRAVENOUS | Status: DC | PRN
  Administered 2023-04-10: 100 mg via INTRAVENOUS
  Administered 2023-04-10: 100 ug/kg/min via INTRAVENOUS

## 2023-04-10 MED ORDER — PHENYLEPHRINE HCL-NACL 20-0.9 MG/250ML-% IV SOLN
INTRAVENOUS | Status: DC | PRN
  Administered 2023-04-10: 100 ug/min via INTRAVENOUS
  Administered 2023-04-10: 50 ug/min via INTRAVENOUS

## 2023-04-10 MED ORDER — SUGAMMADEX SODIUM 200 MG/2ML IV SOLN
INTRAVENOUS | Status: DC | PRN
  Administered 2023-04-10: 200 mg via INTRAVENOUS

## 2023-04-10 MED ORDER — CHLORHEXIDINE GLUCONATE 0.12 % MT SOLN: 15.0000 mL | Freq: Once | OROMUCOSAL | Status: AC

## 2023-04-10 MED ORDER — FENTANYL CITRATE (PF) 100 MCG/2ML IJ SOLN
INTRAMUSCULAR | Status: AC
  Filled 2023-04-10: qty 2

## 2023-04-10 MED ORDER — INSULIN ASPART 100 UNIT/ML IJ SOLN
INTRAMUSCULAR | Status: AC
  Administered 2023-04-10: 2 [IU] via SUBCUTANEOUS
  Filled 2023-04-10: qty 1

## 2023-04-10 MED ORDER — SODIUM CHLORIDE 0.9 % IV SOLN: INTRAVENOUS | Status: DC

## 2023-04-10 MED ORDER — INSULIN ASPART 100 UNIT/ML IJ SOLN: 0.0000 [IU] | INTRAMUSCULAR | Status: DC | PRN

## 2023-04-10 MED ORDER — LACTATED RINGERS IV SOLN: INTRAVENOUS | Status: DC

## 2023-04-10 MED ORDER — LIDOCAINE 2% (20 MG/ML) 5 ML SYRINGE
INTRAMUSCULAR | Status: DC | PRN
  Administered 2023-04-10: 100 mg via INTRAVENOUS

## 2023-04-10 MED ORDER — VASOPRESSIN 20 UNIT/ML IV SOLN
INTRAVENOUS | Status: DC | PRN
  Administered 2023-04-10 (×2): 2 [IU] via INTRAVENOUS

## 2023-04-10 MED ORDER — ONDANSETRON HCL 4 MG/2ML IJ SOLN: 4.0000 mg | Freq: Once | INTRAMUSCULAR | Status: DC | PRN

## 2023-04-10 MED ORDER — OXYCODONE HCL 5 MG/5ML PO SOLN: 5.0000 mg | Freq: Once | ORAL | Status: DC | PRN

## 2023-04-10 MED ORDER — PHENYLEPHRINE 80 MCG/ML (10ML) SYRINGE FOR IV PUSH (FOR BLOOD PRESSURE SUPPORT)
PREFILLED_SYRINGE | INTRAVENOUS | Status: DC | PRN
  Administered 2023-04-10: 80 ug via INTRAVENOUS
  Administered 2023-04-10: 160 ug via INTRAVENOUS

## 2023-04-10 MED ORDER — FENTANYL CITRATE (PF) 250 MCG/5ML IJ SOLN
INTRAMUSCULAR | Status: DC | PRN
  Administered 2023-04-10: 100 ug via INTRAVENOUS

## 2023-04-10 MED ORDER — FENTANYL CITRATE (PF) 100 MCG/2ML IJ SOLN
25.0000 ug | INTRAMUSCULAR | Status: DC | PRN
Start: 2023-04-10 — End: 2023-04-10

## 2023-04-10 MED ORDER — ONDANSETRON HCL 4 MG/2ML IJ SOLN
INTRAMUSCULAR | Status: DC | PRN
Start: 2023-04-10 — End: 2023-04-10
  Administered 2023-04-10: 4 mg via INTRAVENOUS

## 2023-04-10 MED ORDER — ROCURONIUM BROMIDE 10 MG/ML (PF) SYRINGE
PREFILLED_SYRINGE | INTRAVENOUS | Status: DC | PRN
  Administered 2023-04-10: 70 mg via INTRAVENOUS

## 2023-04-10 MED ORDER — CHLORHEXIDINE GLUCONATE 0.12 % MT SOLN
OROMUCOSAL | Status: AC
  Administered 2023-04-10: 15 mL via OROMUCOSAL
  Filled 2023-04-10: qty 15

## 2023-04-10 NOTE — Anesthesia Postprocedure Evaluation (Signed)
 Anesthesia Post Note  Patient: Vanetta Mulders  Procedure(s) Performed: BRONCHOSCOPY, FLEXIBLE (Bilateral) ENDOBRONCHIAL ULTRASOUND (EBUS) (Bilateral) BRONCHOSCOPY, WITH BRUSH BIOPSY BRONCHOSCOPY, WITH NEEDLE ASPIRATION BIOPSY BRONCHOSCOPY, WITH BIOPSY IRRIGATION, BRONCHUS     Patient location during evaluation: PACU Anesthesia Type: General Level of consciousness: awake and alert Pain management: pain level controlled Vital Signs Assessment: post-procedure vital signs reviewed and stable Respiratory status: spontaneous breathing, nonlabored ventilation, respiratory function stable and patient connected to nasal cannula oxygen Cardiovascular status: blood pressure returned to baseline and stable Postop Assessment: no apparent nausea or vomiting Anesthetic complications: no   No notable events documented.  Last Vitals:  Vitals:   04/10/23 1217 04/10/23 1223  BP: (!) 97/52 (!) 109/54  Pulse: 65 61  Resp:  12  Temp:    SpO2: 96% 95%    Last Pain:  Vitals:   04/10/23 1215  PainSc: 0-No pain                 Mariann Barter

## 2023-04-10 NOTE — Op Note (Signed)
 Flexible and EBUS Bronchoscopy Procedure Note  Bobby Thomas  [id number]  05/07/1940  Date:04/10/23  Time:11:01 AM   Provider Performing:Jean-Pierre Michie Molnar   Procedure: Flexible bronchoscopy and EBUS Bronchoscopy  Indication(s) Left Upper Lobe Mass  Consent Risks of the procedure as well as the alternatives and risks of each were explained to the patient and/or caregiver.  Consent for the procedure was obtained.  Anesthesia General Anesthesia  Time Out Verified patient identification, verified procedure, site/side was marked, verified correct patient position, special equipment/implants available, medications/allergies/relevant history reviewed, required imaging and test results available.  Sterile Technique Usual hand hygiene, masks, gowns, and gloves were used  Procedure Description Diagnostic bronchoscope advanced through endotracheal tube and into airway.  Airways were examined down to subsegmental level with findings noted below.  Following diagnostic evaluation, Left upper lobe mass noted to be extending endobronchially at the level of LC1. Left upper lobe brushing was done and was called adequate.  The diagnostic bronchoscope was then removed and the EBUS bronchoscope was advanced into airway with with mass biopsied under [place] x2. We then switched back to the regular bronchoscope and proceeded with endobronchial forceps biopsies x8 or x10. We then proceed with a BAL of the left upper lobe and alliquot sent for cytology.   We then removed the bronchoscope and the EBUS scope was introduced with Station 7 and 11L biopsied. 4R and 4L were evaluated and were small and inadequate for sampling.   Findings:  []  Left Upper Lobe Mass extending endobronchially to the level of LC1.  []  EBUS TBNA Station 7 and 11L.   Complications/Tolerance None; patient tolerated the procedure well.  Chest X-ray is needed post procedure.  EBL Minimal  Specimen(s) []  Left upper lobe mass  endobronchial biopsy.  []  Left upper lobe endobronchial mass brushing.  []  Left upper lobe BAL.  []  EBUS-TBNA station 7 and 11L.   Janann Colonel, MD [facility] 04/10/2023 11:08 AM

## 2023-04-10 NOTE — Patient Instructions (Signed)
Goals Addressed             This Visit's Progress    Pharmacy Goals       Our goal A1c is less than 7%. This corresponds with fasting sugars less than 130 and 2 hour after meal sugars less than 180. Please keep a log of your results when checking your blood sugar   Our goal bad cholesterol, or LDL, is less than 70 . This is why it is important to continue taking your rosuvastatin and ezetimibe   Thank you!  Wallace Cullens, PharmD, [facility] [phone]

## 2023-04-10 NOTE — Anesthesia Procedure Notes (Signed)
 Procedure Name: Intubation Date/Time: 04/10/2023 9:54 AM  Performed by: Kayleen Memos, CRNAPre-anesthesia Checklist: Patient identified, Emergency Drugs available, Suction available and Patient being monitored Patient Re-evaluated:Patient Re-evaluated prior to induction Oxygen Delivery Method: Circle System Utilized Preoxygenation: Pre-oxygenation with 100% oxygen Induction Type: IV induction Ventilation: Mask ventilation without difficulty Laryngoscope Size: Mac and 3 Grade View: Grade I Tube type: Oral Tube size: 8.5 mm Number of attempts: 1 Airway Equipment and Method: Stylet and Oral airway Placement Confirmation: ETT inserted through vocal cords under direct vision, positive ETCO2 and breath sounds checked- equal and bilateral Tube secured with: Tape Dental Injury: Teeth and Oropharynx as per pre-operative assessment

## 2023-04-10 NOTE — Interval H&P Note (Signed)
 Patient is here for RANB. Patient Appropriate for procedure.   Janann Colonel, MD [facility] 04/10/2023 9:20 AM

## 2023-04-10 NOTE — Transfer of Care (Signed)
 Immediate Anesthesia Transfer of Care Note  [name]  Procedure(s) Performed: BRONCHOSCOPY, FLEXIBLE (Bilateral) ENDOBRONCHIAL ULTRASOUND (EBUS) (Bilateral) BRONCHOSCOPY, WITH BRUSH BIOPSY BRONCHOSCOPY, WITH NEEDLE ASPIRATION BIOPSY BRONCHOSCOPY, WITH BIOPSY IRRIGATION, BRONCHUS  Patient Location: PACU  Anesthesia Type:General  Level of Consciousness: awake  Airway & Oxygen Therapy: Patient Spontanous Breathing  Post-op Assessment: Report given to RN, Post -op Vital signs reviewed and stable, and Patient moving all extremities  Post vital signs: Reviewed and stable  Last Vitals:  Vitals Value Taken Time  BP 85/37 04/10/23 1116  Temp    Pulse 67 04/10/23 1119  Resp 13 04/10/23 1119  SpO2 99 % 04/10/23 1119  Vitals shown include unfiled device data.  Last Pain:  Vitals:   04/10/23 0741  PainSc: 0-No pain      Patients Stated Pain Goal: 0 (04/10/23 0741)  Complications: No notable events documented.

## 2023-04-13 ENCOUNTER — Encounter (HOSPITAL_COMMUNITY): Payer: Self-pay | Admitting: Pulmonary Disease

## 2023-04-13 ENCOUNTER — Encounter: Payer: Self-pay | Admitting: Family Medicine

## 2023-04-14 ENCOUNTER — Telehealth: Payer: Self-pay | Admitting: Pharmacist

## 2023-04-14 ENCOUNTER — Other Ambulatory Visit: Payer: Self-pay | Admitting: Emergency Medicine

## 2023-04-14 ENCOUNTER — Telehealth: Payer: Self-pay | Admitting: Pulmonary Disease

## 2023-04-14 DIAGNOSIS — E1122 Type 2 diabetes mellitus with diabetic chronic kidney disease: Secondary | ICD-10-CM

## 2023-04-14 LAB — CYTOLOGY - NON PAP

## 2023-04-14 MED ORDER — APIXABAN 2.5 MG PO TABS: 2.5000 mg | ORAL_TABLET | Freq: Two times a day (BID) | ORAL | 0 refills | Status: DC

## 2023-04-14 NOTE — Progress Notes (Signed)
 Sample correction due to quantity not charted originally. Samples given 03/31/23. Eliquis discontinues in hospital on 04/10/23.

## 2023-04-14 NOTE — Addendum Note (Signed)
 Addended by: Jani Gravel on: 04/14/2023 07:56 AM   Modules accepted: Orders

## 2023-04-14 NOTE — Telephone Encounter (Signed)
 Discussed cytology results with Patient's wife Louis with patient in presence. Has a follow up Apt on 03/13 with Dr. Cathie Hoops.

## 2023-04-14 NOTE — Progress Notes (Signed)
 Dr. Sherrie Mustache,   I apologize if this is a duplicate message  On 04/09/2023, patient's wife shared that patient was unable to tolerate Rybelsus dose increase to 7 mg daily. Said he did not feel well on this dose and unwilling to take Rybelsus. Recent morning fasting blood sugar readings ranging 184-213 on Farxiga 10 mg daily, glipizide 10 mg twice daily and pioglitazone 45 mg daily.   Note was previously on Januvia and tolerated well, but med clearance significantly impacted by renal function. Tradjenta, like Januvia, is a DPPIV inhibitor, but does not require renal dose adjustment/not renally cleared. Would you consider starting patient on Tradjenta in place of the Rybelsus?  -He should be eligible for patient assistance for this, but spouse said could start by purchasing from local pharmacy/through insurance.    Also, given his latest renal function (calc CrCl ~23 ml/min), would you please consider dose reduction of rosuvastatin to 10 mg daily due to renal clearance?   Thank you!   Estelle Grumbles, PharmD, [facility] [phone]

## 2023-04-17 ENCOUNTER — Telehealth: Payer: Self-pay | Admitting: Oncology

## 2023-04-17 ENCOUNTER — Encounter: Payer: Self-pay | Admitting: Oncology

## 2023-04-17 ENCOUNTER — Inpatient Hospital Stay: Payer: HMO

## 2023-04-17 ENCOUNTER — Inpatient Hospital Stay: Payer: HMO | Attending: Oncology | Admitting: Oncology

## 2023-04-17 ENCOUNTER — Telehealth: Payer: Self-pay

## 2023-04-17 ENCOUNTER — Encounter: Payer: Self-pay | Admitting: *Deleted

## 2023-04-17 VITALS — BP 106/70 | HR 77 | Temp 96.7°F | Resp 18 | Wt 165.2 lb

## 2023-04-17 DIAGNOSIS — I129 Hypertensive chronic kidney disease with stage 1 through stage 4 chronic kidney disease, or unspecified chronic kidney disease: Secondary | ICD-10-CM | POA: Insufficient documentation

## 2023-04-17 DIAGNOSIS — E785 Hyperlipidemia, unspecified: Secondary | ICD-10-CM | POA: Insufficient documentation

## 2023-04-17 DIAGNOSIS — I2510 Atherosclerotic heart disease of native coronary artery without angina pectoris: Secondary | ICD-10-CM | POA: Diagnosis not present

## 2023-04-17 DIAGNOSIS — C3412 Malignant neoplasm of upper lobe, left bronchus or lung: Secondary | ICD-10-CM | POA: Diagnosis not present

## 2023-04-17 DIAGNOSIS — I700 Atherosclerosis of aorta: Secondary | ICD-10-CM | POA: Insufficient documentation

## 2023-04-17 DIAGNOSIS — J432 Centrilobular emphysema: Secondary | ICD-10-CM | POA: Diagnosis not present

## 2023-04-17 DIAGNOSIS — N184 Chronic kidney disease, stage 4 (severe): Secondary | ICD-10-CM | POA: Insufficient documentation

## 2023-04-17 DIAGNOSIS — Z85828 Personal history of other malignant neoplasm of skin: Secondary | ICD-10-CM | POA: Diagnosis not present

## 2023-04-17 DIAGNOSIS — E1122 Type 2 diabetes mellitus with diabetic chronic kidney disease: Secondary | ICD-10-CM | POA: Diagnosis not present

## 2023-04-17 DIAGNOSIS — Z7189 Other specified counseling: Secondary | ICD-10-CM | POA: Diagnosis not present

## 2023-04-17 DIAGNOSIS — I4891 Unspecified atrial fibrillation: Secondary | ICD-10-CM | POA: Insufficient documentation

## 2023-04-17 DIAGNOSIS — R042 Hemoptysis: Secondary | ICD-10-CM | POA: Diagnosis not present

## 2023-04-17 DIAGNOSIS — I7143 Infrarenal abdominal aortic aneurysm, without rupture: Secondary | ICD-10-CM | POA: Insufficient documentation

## 2023-04-17 DIAGNOSIS — Z87891 Personal history of nicotine dependence: Secondary | ICD-10-CM | POA: Insufficient documentation

## 2023-04-17 DIAGNOSIS — Z7984 Long term (current) use of oral hypoglycemic drugs: Secondary | ICD-10-CM | POA: Insufficient documentation

## 2023-04-17 DIAGNOSIS — R918 Other nonspecific abnormal finding of lung field: Secondary | ICD-10-CM

## 2023-04-17 DIAGNOSIS — C3492 Malignant neoplasm of unspecified part of left bronchus or lung: Secondary | ICD-10-CM | POA: Diagnosis not present

## 2023-04-17 DIAGNOSIS — Z8673 Personal history of transient ischemic attack (TIA), and cerebral infarction without residual deficits: Secondary | ICD-10-CM | POA: Diagnosis not present

## 2023-04-17 DIAGNOSIS — I671 Cerebral aneurysm, nonruptured: Secondary | ICD-10-CM | POA: Insufficient documentation

## 2023-04-17 DIAGNOSIS — Z79899 Other long term (current) drug therapy: Secondary | ICD-10-CM | POA: Insufficient documentation

## 2023-04-17 DIAGNOSIS — K869 Disease of pancreas, unspecified: Secondary | ICD-10-CM | POA: Insufficient documentation

## 2023-04-17 DIAGNOSIS — E1151 Type 2 diabetes mellitus with diabetic peripheral angiopathy without gangrene: Secondary | ICD-10-CM | POA: Diagnosis not present

## 2023-04-17 NOTE — Assessment & Plan Note (Signed)
 Discussed with patient and wife. Treatment is with curative intent.

## 2023-04-17 NOTE — Addendum Note (Signed)
 Addended by: Rickard Patience on: 04/17/2023 01:59 PM   Modules accepted: Orders

## 2023-04-17 NOTE — Telephone Encounter (Signed)
 Order for Dan Humphreys has been placed in [facility] portal.

## 2023-04-17 NOTE — Addendum Note (Signed)
 Addended by: Glory Buff on: 04/17/2023 01:54 PM   Modules accepted: Orders

## 2023-04-17 NOTE — Progress Notes (Addendum)
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 changes of the spine. No acute osseous pathology. IMPRESSION: 1. Left upper lobe/suprahilar mass as above most consistent with a primary lung malignancy. Multidisciplinary consult is advised. 2. Mildly enlarged subcarinal  lymph node. 3. Indeterminate 2 cm nodule in the tail of the pancreas. Further evaluation with MRI is recommended. 4. Aortic Atherosclerosis (ICD10-I70.0) and Emphysema (ICD10-J43.9). Electronically Signed   By: Elgie Collard M.D.   On: 03/04/2023 10:12   CT HEAD WO CONTRAST ( ) Result Date: 03/04/2023 CLINICAL DATA:  Syncope/presyncope with cerebrovascular cause suspected. EXAM: CT HEAD WITHOUT CONTRAST TECHNIQUE: Contiguous axial images were obtained from the base of the skull through the vertex without intravenous contrast. RADIATION DOSE REDUCTION: This exam was performed according to the departmental dose-optimization program which includes automated exposure control, adjustment of the mA and/or kV according to patient size and/or use of iterative reconstruction technique.  COMPARISON:  01/31/2006 FINDINGS: Brain: No evidence of acute infarction, hemorrhage, hydrocephalus, extra-axial collection or mass lesion/mass effect. Chronic left MCA distribution infarct, progressed. Chronic lacunar infarct at the right caudate head. Progression of generalized brain atrophy. Vascular: No hyperdense vessel. Bulbous appearance at the right MCA bifurcation with calcification, suspect a 4 mm aneurysm. Skull: No acute or aggressive finding. Sinuses/Orbits: Mucosal thickening with secretions in the right sphenoid and right frontal sinuses. Negative orbits. IMPRESSION: 1. No acute finding. 2. 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 3. Generalized brain atrophy since prior scan as well. 4. 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 5. Active sinusitis. Electronically Signed   By: Tiburcio Pea M.D.   On: 03/04/2023 09:39   DG Chest 2 View Result Date: 02/27/2023 CLINICAL DATA:  Fall.  Rib pain. EXAM: CHEST - 2 VIEW COMPARISON:  03/03/2011. FINDINGS: There is an approximately 5.4 x 8.1 cm mass in the left upper lobe, abutting the aortic arch. Further evaluation with contrast-enhanced chest CT scan is recommended. Bilateral lung fields are otherwise clear. There is blunting of right lateral costophrenic angle, which may be due to pleural thickening. Bilateral posterior costophrenic angles are clear. Normal cardio-mediastinal silhouette. No acute osseous abnormalities. No acute displaced rib fracture seen. The soft tissues are within normal limits. IMPRESSION: *Left upper lobe mass. Further evaluation with contrast-enhanced chest CT scan is recommended. Electronically Signed   By: Jules Schick M.D.   On: 02/27/2023 15:47

## 2023-04-17 NOTE — Assessment & Plan Note (Signed)
 Encourage oral hydration and avoid nephrotoxins.

## 2023-04-17 NOTE — Progress Notes (Signed)
 Met with patient and his spouse during follow up visit with Dr. Cathie Hoops to discuss recent pathology results and recommended treatment options. All questions answered during visit. Reviewed upcoming appts. Pt's spouse stated that they will discuss his treatment options with family and will call back with decision regarding treatment. Pt given resources regarding diagnosis and supportive services available. Instructed to call with any questions or needs. Pt and spouse verbalized understanding. Nothing further needed at this time.

## 2023-04-17 NOTE — Progress Notes (Signed)
 START ON PATHWAY REGIMEN - Non-Small Cell Lung     A cycle is every 7 days, concurrent with RT:     Paclitaxel      Carboplatin   **Always confirm dose/schedule in your pharmacy ordering system**  Patient Characteristics: Preoperative or Nonsurgical Candidate (Clinical Staging), Stage IIB (N2a only) or Stage III - Nonsurgical Candidate, PS = 0,1 Therapeutic Status: Preoperative or Nonsurgical Candidate (Clinical Staging) AJCC T Category: cT4 AJCC N Category: cN0 AJCC M Category: cM0 AJCC 9 Stage Grouping: IIIA Check here if patient was staged using an edition other than AJCC Staging 9th Edition: false ECOG Performance Status: 1 Intent of Therapy: Curative Intent, Discussed with Patient

## 2023-04-17 NOTE — Telephone Encounter (Signed)
 Called patient to schedule nutrition appointment and wife informed me that he is not going to do Chemo and request all appointments be cancelled. I reminded her to give [place] a call if thye need anything.  She is going to call [facility]. To let he know.   Chemo education appointment cancelled and nutrition not scheduled. Referral closed.

## 2023-04-17 NOTE — Assessment & Plan Note (Addendum)
 Image findings and pathology results were reviewed with patient and his wife.  Large left upper lobe/supra hilar mass, hypermetabolic on PET, mass involves the left hilar and suprahilar regions but no discrete separate hilar or mediastinal adenopathy. No distant metastatic disease. MRI brain negative.  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  Recommend concurrent chemotherapy [carboplatin taxol] with radiation, followed by Durvalumab maintenance x 1 year Rationale and side effects were reviewed with patient.  Patient and wife are undecided and plan to obtain more information,   Will arrange patient to get chemotherapy education.  Refer to Radonc  Option of medi port placement was discussed.     04/17/23 1: 58pm Addendum: patient has decided not proceed with any treatment. Refer to palliative care service.

## 2023-04-17 NOTE — Assessment & Plan Note (Signed)
 He is on low dose Eliquis 2.5mg  BID. Monitor symptoms.

## 2023-04-17 NOTE — Progress Notes (Signed)
 [facility] Healthcare Advance Directives Clinical Social Work  Patient presented to Advance Directives Clinic  to review and complete healthcare advance directives.  Clinical Social Worker met with patient and his wife, Bobby Thomas.  The patient designated Bobby Thomas as their primary healthcare agent and Bobby Thomas as their secondary agent.  Patient also completed healthcare living will.    Documents were notarized and copies made for patient/family. Clinical Social Worker will send documents to medical records to be scanned into patient's chart. Clinical Social Worker encouraged patient/family to contact with any additional questions or concerns.   Dorothey Baseman, LCSW Clinical Social Worker [facility]

## 2023-04-17 NOTE — Progress Notes (Signed)
 Pt here for follow up. NO new concerns voiced

## 2023-04-17 NOTE — Progress Notes (Signed)
 Phone call received later in the day from pt's spouse that pt and family have decided to not pursue cancer treatment at this time. Pt's wife requests referral to palliative care. Dr. Cathie Hoops made aware. Appts cancelled and referral for palliative care placed.

## 2023-04-21 ENCOUNTER — Ambulatory Visit: Admitting: Radiation Oncology

## 2023-04-23 ENCOUNTER — Other Ambulatory Visit: Payer: Self-pay | Admitting: Pharmacist

## 2023-04-23 ENCOUNTER — Telehealth: Payer: Self-pay

## 2023-04-23 DIAGNOSIS — E1122 Type 2 diabetes mellitus with diabetic chronic kidney disease: Secondary | ICD-10-CM

## 2023-04-23 MED ORDER — LINAGLIPTIN 5 MG PO TABS: 5.0000 mg | ORAL_TABLET | Freq: Every day | ORAL | 2 refills | Status: DC

## 2023-04-23 NOTE — Telephone Encounter (Signed)
 Ok, have sent [facility] Tr[facility]. Please advise pt.

## 2023-04-23 NOTE — Patient Instructions (Signed)
 Goals Addressed             This Visit's Progress    Pharmacy Goals       Please watch the mail for an envelope from [facility] containing the patient assistance program application. Please complete this application and bring to office to have it faxed back to Attention: Lillia Abed at Fax # [phone] along with a copy of your Medicare Part D prescription card and a copy of your proof of income document.  If you need to call Darien Ramus, you can reach her at [phone].   Our goal A1c is less than 7%. This corresponds with fasting sugars less than 130 and 2 hour after meal sugars less than 180. Please keep a log of your results when checking your blood sugar   Our goal bad cholesterol, or LDL, is less than 70. This is why it is important to continue taking your rosuvastatin and ezetimibe.     Thank you!   Estelle Grumbles, PharmD, [facility] [phone]

## 2023-04-23 NOTE — Addendum Note (Signed)
 Addended by: Malva Limes on: 04/23/2023 08:02 AM   Modules accepted: Orders

## 2023-04-23 NOTE — Telephone Encounter (Signed)
 Spoke with Mr. Colan wife today regarding PAP BI CARES Derrell Lolling) Mrs. Zoll  is aware he will be receiving application by mail and will mail then back once they received.will follow up with pt in a couple of days.

## 2023-04-23 NOTE — Progress Notes (Signed)
   04/23/2023  Patient ID: Bobby Thomas, male   DOB: 08/03/1940, 83 y.o.   MRN: [id number]  Collaborated with PCP regarding diabetes medication management for patient. As patient unable to tolerate Rybelsus, recommended switch to [facility] - Provider agreed and sent prescription for Tradjenta to pharmacy  Will follow up with patient/wife to provide update.  Based on reported income, patient meets criteria for patient assistance for Tradjenta from [facility]. Will collaborate with PCP and CPhT to aid patient with applying for this assistance program.  Estelle Grumbles, PharmD, [facility] [phone]

## 2023-04-24 ENCOUNTER — Other Ambulatory Visit: Payer: Self-pay | Admitting: Family Medicine

## 2023-04-24 ENCOUNTER — Other Ambulatory Visit

## 2023-05-08 ENCOUNTER — Other Ambulatory Visit: Payer: Self-pay | Admitting: Cardiovascular Disease

## 2023-05-08 DIAGNOSIS — I4821 Permanent atrial fibrillation: Secondary | ICD-10-CM

## 2023-05-08 NOTE — Telephone Encounter (Signed)
 Prescription refill request for Eliquis received. Indication: Afib  Last office visit: 03/31/23 Bobby Thomas)  Scr: 2.09 (04/10/23)  Age: 83 Weight: 74.9kg  Appropriate dose. Refill sent.

## 2023-05-14 NOTE — Telephone Encounter (Signed)
 Gave pt a call following up on pt PAP BICARES, we have not received pt's application back, pt did not answer left a HIPAA VM will follow up,

## 2023-05-16 ENCOUNTER — Encounter: Payer: Self-pay | Admitting: Family Medicine

## 2023-05-16 ENCOUNTER — Ambulatory Visit: Payer: HMO | Admitting: Family Medicine

## 2023-05-16 VITALS — BP 99/52 | HR 71 | Resp 16 | Ht 66.0 in | Wt 165.6 lb

## 2023-05-16 DIAGNOSIS — Z7984 Long term (current) use of oral hypoglycemic drugs: Secondary | ICD-10-CM | POA: Diagnosis not present

## 2023-05-16 DIAGNOSIS — E1122 Type 2 diabetes mellitus with diabetic chronic kidney disease: Secondary | ICD-10-CM | POA: Diagnosis not present

## 2023-05-16 DIAGNOSIS — R262 Difficulty in walking, not elsewhere classified: Secondary | ICD-10-CM | POA: Diagnosis not present

## 2023-05-16 DIAGNOSIS — M6281 Muscle weakness (generalized): Secondary | ICD-10-CM | POA: Diagnosis not present

## 2023-05-16 DIAGNOSIS — R911 Solitary pulmonary nodule: Secondary | ICD-10-CM | POA: Diagnosis not present

## 2023-05-16 LAB — POCT GLYCOSYLATED HEMOGLOBIN (HGB A1C)
Est. average glucose Bld gHb Est-mCnc: 235
Hemoglobin A1C: 9.8 % — AB (ref 4.0–5.6)

## 2023-05-16 NOTE — Patient Instructions (Signed)
 Marland Kitchen  Please review the attached list of medications and notify my office if there are any errors.   . Please bring all of your medications to every appointment so we can make sure that our medication list is the same as yours.

## 2023-05-16 NOTE — Progress Notes (Signed)
Established patient visit   [name]   DOB: April 23, 1940   83 y.o. Male  MRN: [id number] Visit Date: 05/16/2023  Today's healthcare provider: Mila Merry, MD   Chief Complaint  Patient presents with   Medical Management of Chronic Issues    2 months follow-up T2DM. Eye Exam scheduled 10/2023    Subjective    Discussed the use of AI scribe software for clinical note transcription with the patient, who gave verbal consent to proceed.  History of Present Illness   Bobby Thomas "Bobby Thomas" is a 83 year old male with diabetes who presents for follow-up of blood sugar control. He is accompanied by a caregiver who assists with blood sugar monitoring.  His blood sugar levels have improved since starting Tradjenta two weeks ago. Previously, while on 7mg  Rybelsus, his blood sugar readings were consistently over 200 mg/dL. Since switching to Tradjenta, he has had only one reading over 200 mg/dL, with most readings ranging from 139 to 187 mg/dL. His blood sugar control is better than before, though not yet optimal.  His current medication regimen includes glipizide, metformin, pioglitazone, and Tradjenta, which he started approximately two weeks ago. He previously used Rybelsus, which did not effectively lower his blood sugar levels. He monitors his blood sugar regularly, with assistance from a caregiver who is present during the checks.  His most recent A1c is 9.8%, indicating that his overall blood sugar control has not yet reached target levels, likely due to the recent change in medication.     Lab Results  Component Value Date   HGBA1C 9.8 (A) 05/16/2023   HGBA1C 9.2 (H) 02/14/2023   HGBA1C 8.0 (A) 11/04/2022     Medications: Outpatient Medications Prior to Visit  Medication Sig   apixaban (ELIQUIS) 2.5 MG TABS tablet TAKE 1 TABLET BY MOUTH TWICE A DAY   dapagliflozin propanediol (FARXIGA) 10 MG TABS tablet Take 1 tablet (10 mg total) by mouth daily before  breakfast. [place].   EPINEPHrine 0.3 mg/0.3 mL IJ SOAJ injection Inject 0.3 mg into the muscle as needed.   ezetimibe (ZETIA) 10 MG tablet Take 1 tablet (10 mg total) by mouth daily.   furosemide (LASIX) 40 MG tablet Take 40 mg by mouth daily.   glipiZIDE (GLUCOTROL) 10 MG tablet Take 1 tablet (10 mg total) by mouth 2 (two) times daily before a meal.   linagliptin (TRADJENTA) 5 MG TABS tablet Take 1 tablet (5 mg total) by mouth daily.   LORazepam (ATIVAN) 1 MG tablet TAKE 1/2 TO 1 TABLET BY MOUTH NIGHTLY AT BEDTIME   metoprolol succinate (TOPROL-XL) 25 MG 24 hr tablet Take 1 tablet (25 mg total) by mouth daily.   ONETOUCH ULTRA test strip USE TO CHECK BLOOD SUGAR DAILY FOR TYPE 2 DIABETES DX: E11.9   pioglitazone (ACTOS) 45 MG tablet TAKE 1 TABLET BY MOUTH EVERY DAY   rosuvastatin (CRESTOR) 40 MG tablet Take 1 tablet (40 mg total) by mouth daily.   No facility-administered medications prior to visit.      Objective    BP (!) 99/52 (BP Location: Left Arm, Patient Position: Sitting, Cuff Size: Normal)   Pulse 71   Resp 16   Ht 5\' 6"  (1.676 m)   Wt 165 lb 9.6 oz (75.1 kg)   SpO2 97%   BMI 26.73 kg/m   Physical Exam   General appearance: Well developed, well nourished male, cooperative and in no acute distress  Head: Normocephalic, without obvious abnormality, atraumatic Respiratory: Respirations even and unlabored, normal respiratory rate Extremities: All extremities are intact.  Skin: Skin color, texture, turgor normal. No rashes seen  Psych: Appropriate mood and affect. Neurologic: Mental status: Alert, oriented to person, place, and time, thought content appropriate.     Results for orders placed or performed in visit on 05/16/23  POCT HgB A1C  Result Value Ref Range   Hemoglobin A1C 9.8 (A) 4.0 - 5.6 %   Est. average glucose Bld gHb Est-mCnc 235     Assessment & Plan        Type 2 Diabetes Mellitus Elevated glucose levels, A1c at  9.8%, but he reports sugars improved since change from Rybelsus to Tradjenta  - Continue Tradjenta, monitor glucose. - Re-evaluate A1c and glucose in three months. - Discussed option of changing to [facility] if A1c not significantly improved at follow up in 3 months. He and his wife were amenable to that option.   General Health Maintenance Eye examinations up to date. - Schedule follow-up for July.    Return in about 3 months (around 08/31/2023) for Diabetes, Hypertension.      Mila Merry, MD  [facility] [phone] (phone) [phone] (fax)  [facility]

## 2023-05-21 NOTE — Telephone Encounter (Signed)
 Pt return call explain she has mail back PAP for BICARES let pt know if not received by next Monday we will mail it again pt agree, will follow up

## 2023-06-03 ENCOUNTER — Telehealth: Payer: Self-pay | Admitting: Family Medicine

## 2023-06-03 NOTE — Telephone Encounter (Signed)
 Copied from CRM [id number]. Topic: General - Other >> Jun 03, 2023 10:01 AM Allyne Areola wrote: Reason for CRM: Bobby Thomas, patient wife is calling to see if we can mail them a handicap place holder form. She drives patient to all his appointments and this would help them a lot.

## 2023-06-05 NOTE — Telephone Encounter (Signed)
 Received pt portion of application BICARES,providers portion was missing signature re-faxing provider portion and will wait until fax back to submitted to company.

## 2023-06-06 NOTE — Telephone Encounter (Signed)
Pt's wife picked up

## 2023-06-06 NOTE — Telephone Encounter (Signed)
 Patient's wife Shean Demos aware. She is going to try to stop by today to pick up.

## 2023-06-06 NOTE — Telephone Encounter (Signed)
 Handicap parking form completed, signed and left at my workstation

## 2023-06-10 NOTE — Telephone Encounter (Signed)
 Received provider portion of pap Bicares application has faxed to company and will follow up in a few days.

## 2023-06-24 NOTE — Telephone Encounter (Signed)
 Following up on pt application [employer] Trajenta 5 mg. Spoke with a representative said they still have not received provider portion pg#4(where provider was missing a signature) it was fax 5/6 but did not received it,fax it today 06/24/23, and will follow up in a few days.

## 2023-06-25 ENCOUNTER — Telehealth: Payer: Self-pay | Admitting: Pharmacist

## 2023-06-25 ENCOUNTER — Other Ambulatory Visit: Payer: Self-pay | Admitting: Pharmacist

## 2023-06-25 DIAGNOSIS — E1122 Type 2 diabetes mellitus with diabetic chronic kidney disease: Secondary | ICD-10-CM

## 2023-06-25 MED ORDER — TIRZEPATIDE 2.5 MG/0.5ML ~~LOC~~ SOAJ: 2.5000 mg | SUBCUTANEOUS | 1 refills | Status: DC

## 2023-06-25 NOTE — Progress Notes (Signed)
 Dr. Shann Darnel,  Followed up with patient's wife today regarding blood sugar control. Recent morning fasting blood sugar readings ranging 138-236 on Farxiga  10 mg daily, glipizide  10 mg twice daily, pioglitazone  45 mg daily and Tradjenta  5 mg daily.  I see that at last office visit you discussed with patient potential switch from Tradjenta  to Mounjaro. Patient/spouse interested in making this change now for tighter blood sugar control. Would you please consider starting patient on Mounjaro 2.5 mg weekly (order pended within this encounter for your consideration)?  Thank you!  Arthur Lash, PharmD, [facility] [phone]

## 2023-06-25 NOTE — Progress Notes (Signed)
06/25/2023 Name: Bobby Thomas MRN: [id number] DOB: [birth year]  Chief Complaint  Patient presents with   Medication Management   Medication Assistance    Bobby Thomas is a 83 y.o. year old male who presented for a telephone visit.   They were referred to the pharmacist by their PCP for assistance in managing diabetes and medication access.      Subjective:   Care Team: Primary Care Provider: Lamon Pillow, MD ; Next Scheduled Visit: 08/30/2023 Nephrologist: Gari Junior, MD; Next Scheduled Visit: 08/20/2023 Cardiologist: Devorah Fonder, MD Oncologist: [name], MD  Medication Access/Adherence  [facility] [address] [place] - [address] [place] [address] Phone: [phone] Fax: [phone]  [facility] - [place], [place] - [address] [place] [address] Phone: [phone] Fax: [phone]   Patient reports affordability concerns with their medications: No  Patient reports access/transportation concerns to their pharmacy: No  Patient reports adherence concerns with their medications:  No     Weekly pillbox as filled by spouse. Reports rarely misses doses   Reports has met $2000 max out of pocket cost for medication, so patient no longer having copayments on his prescription medications in current calendar year  Diabetes:   Current medications:  - Farxiga  10 mg daily - glipizide  10 mg twice daily before breakfast and supper (30 minutes prior) - pioglitazone  45 mg daily - Tradjenta  5 mg daily   Previous therapies tried/barriers: patient prefers to avoid injectable meditations; Janumet  (CKD), Rybelsus    Current glucose readings: morning fasting reading ranging: 138-236; today: 234   Patient denies hypoglycemic s/sx including dizziness, shakiness, sweating.    Current dietary habits: - Breakfast: sausage biscuit with cheese and 2 eggs + coffee (with milk) - Lunch: ham and  cheese sandwich - Supper: meat with a couple of vegetables and instant potatoes - Drinks: water and coffee - Snacks: oatmeal cookies or instant grits during the day  Current physical activity: limited recently due to fatigue   Statin therapy: rosuvastatin  40 mg daily + ezetimibe  10 mg daily   Current medication access support:  - Enrolled in patient assistance for Farxiga  from [place] through 02/04/2024   Objective:  Lab Results  Component Value Date   HGBA1C 9.8 (A) 05/16/2023    Lab Results  Component Value Date   CREATININE 2.09 (H) 04/10/2023   BUN 52 (H) 04/10/2023   NA 134 (L) 04/10/2023   K 4.1 04/10/2023   CL 99 04/10/2023   CO2 23 04/10/2023    Lab Results  Component Value Date   CHOL 99 (L) 02/14/2023   HDL 45 02/14/2023   LDLCALC 31 02/14/2023   TRIG 133 02/14/2023   CHOLHDL 2.2 02/14/2023    Medications Reviewed Today     Reviewed by Ardis Becton, RPH-CPP (Pharmacist) on 06/25/23 at [phone]  Med List Status: <None>   Medication Order Taking? Sig Documenting Provider Last Dose Status Informant  apixaban  (ELIQUIS ) 2.5 MG TABS tablet [id number]  TAKE 1 TABLET BY MOUTH TWICE A DAY Gollan, Timothy J, MD  Active   dapagliflozin  propanediol (FARXIGA ) 10 MG TABS tablet [id number] Yes Take 1 tablet (10 mg total) by mouth daily before breakfast. For [place]. Lamon Pillow, MD Taking Active   EPINEPHrine  0.3 mg/0.3 mL IJ SOAJ injection [id number]  Inject 0.3 mg into the muscle as needed. Lamon Pillow, MD  Active   ezetimibe  (ZETIA ) 10 MG tablet [id number]  Take 1 tablet (10 mg total) by mouth daily. Gollan, Timothy J, MD  Active   furosemide (LASIX) 40 MG tablet [id number]  Take 40 mg by mouth daily. [provider]  Active   glipiZIDE  (GLUCOTROL ) 10 MG tablet [id number] Yes Take 1 tablet (10 mg total) by mouth 2 (two) times daily before a meal. Fisher, Erlinda Haws, MD Taking Active   linagliptin  (TRADJENTA ) 5 MG TABS tablet  [id number] Yes Take 1 tablet (5 mg total) by mouth daily. Lamon Pillow, MD Taking Active   LORazepam  (ATIVAN ) 1 MG tablet [id number]  TAKE 1/2 TO 1 TABLET BY MOUTH NIGHTLY AT BEDTIME Lamon Pillow, MD  Active   metoprolol  succinate (TOPROL -XL) 25 MG 24 hr tablet [id number]  Take 1 tablet (25 mg total) by mouth daily. Lamon Pillow, MD  Active   [facility] ULTRA test strip [id number]  USE TO CHECK BLOOD SUGAR DAILY FOR TYPE 2 DIABETES DX: E11.9 Lamon Pillow, MD  Active   pioglitazone  (ACTOS ) 45 MG tablet [id number] Yes TAKE 1 TABLET BY MOUTH EVERY DAY Lamon Pillow, MD Taking Active   rosuvastatin  (CRESTOR ) 40 MG tablet [id number]  Take 1 tablet (40 mg total) by mouth daily. Gollan, Timothy J, MD  Active               Assessment/Plan:   Diabetes: - Currently uncontrolled - Reviewed long term cardiovascular and renal outcomes of uncontrolled blood sugar - Reviewed goal A1c, goal fasting, and goal 2 hour post prandial glucose - Have reviewed dietary modifications including: - Recommend to check glucose, keep log of results and have this record to review at upcoming medical appointments. Patient to contact provider office sooner if needed for readings outside of established parameters or symptoms - Send message to PCP to collaborate regarding diabetes medication management for patient  Patient now interested in once weekly injection as discussed with PCP at latest office visit. Note Mounjaro is covered as a tier 3 option through patient's insurance   Follow Up Plan: Clinical Pharmacist will follow up with patient by telephone within the next 2 weeks   Arthur Lash, PharmD, [facility] [phone]

## 2023-06-26 ENCOUNTER — Other Ambulatory Visit: Payer: Self-pay | Admitting: Pharmacist

## 2023-06-26 ENCOUNTER — Encounter: Payer: Self-pay | Admitting: Pharmacist

## 2023-06-26 ENCOUNTER — Other Ambulatory Visit (HOSPITAL_COMMUNITY): Payer: Self-pay

## 2023-06-26 DIAGNOSIS — E1122 Type 2 diabetes mellitus with diabetic chronic kidney disease: Secondary | ICD-10-CM

## 2023-06-26 NOTE — Patient Instructions (Signed)
Goals Addressed             This Visit's Progress    Pharmacy Goals       Our goal A1c is less than 7%. This corresponds with fasting sugars less than 130 and 2 hour after meal sugars less than 180. Please keep a log of your results when checking your blood sugar   Our goal bad cholesterol, or LDL, is less than 70 . This is why it is important to continue taking your rosuvastatin and ezetimibe   Thank you!  Wallace Cullens, PharmD, [facility] [phone]

## 2023-06-26 NOTE — Telephone Encounter (Signed)
 Receive a message from PA advocated Sarah cpht,she received a new rx from provider office on Monjaro and to D/C Tradjenta .at this time I will stop the process on Tradjenta  pap.

## 2023-06-26 NOTE — Progress Notes (Addendum)
   06/26/2023  Patient ID: Bobby Thomas, male   DOB: 1940/06/23, 83 y.o.   MRN: [id number]  Sent message to PCP to collaborate regarding switching patient from Tradjenta  to Mounjaro 2.5 mg once weekly - Provider sends prescription for Mounjaro to pharmacy for patient  Follow up with patient/spouse to provide update. Spouse verbalizes understanding of plan to stop Tradjenta  and start Mounjaro 2.5 mg once weekly - Counseled on Mounjaro, including mechanism of action, side effects, and benefits. No personal or family history of medullary thyroid  cancer, personal history of pancreatitis or gallbladder disease. Counseled on potential side effects of nausea, stomach upset, queasiness, constipation, and that these generally improve over time. Advised to contact our office with more severe symptoms, including nausea, diarrhea, stomach pain. Spouse verbalized understanding.  Send patient MyChart message with manufacturer "how to use" video for Mounjaro and request patient review this video and reach out to me, the office or your pharmacy with any questions prior to starting this medication   - Recommend to check glucose, keep log of results and have this record to review at upcoming medical appointments.  Patient to contact provider office sooner if needed for readings outside of established parameters or symptoms  Counsel patient on s/s of low blood sugar and how to treat lows Encourage to pick up glucose tablets to carry with in case needed for hypoglycemia   Follow Up Plan: Clinical Pharmacist will follow up with patient/spouse by telephone on 07/16/2023 at 9:30 AM   Arthur Lash, PharmD, [facility] [phone]

## 2023-06-26 NOTE — Patient Instructions (Signed)
 Goals Addressed             This Visit's Progress    Pharmacy Goals       As we discussed today, the plan is to stop taking Tradjenta .   In place, start Mounjaro 2.5 mg once weekly injection as prescribed by Dr. Shann Darnel. This medication may cause stomach upset, queasiness, or constipation, especially when first starting. This generally improves over time. Call our office if these symptoms occur and worsen, or if you have severe symptoms such as vomiting, diarrhea, or stomach pain.    The following is the web address for the manufacturer "how to use" video for Mounjaro.    https://mounjaro.lilly.com/how-to-use-mounjaro   Please review this video and reach out to me, the office or your pharmacy with any questions prior to starting this medication.   Our goal A1c is less than 7%. This corresponds with fasting sugars less than 130 and 2 hour after meal sugars less than 180. Please keep a log of your results when checking your blood sugar   Our goal bad cholesterol, or LDL, is less than 70. This is why it is important to continue taking your rosuvastatin  and ezetimibe .     Thank you!   Arthur Lash, PharmD, [facility] [phone]

## 2023-07-03 ENCOUNTER — Other Ambulatory Visit (HOSPITAL_COMMUNITY): Payer: Self-pay

## 2023-07-10 ENCOUNTER — Telehealth: Payer: Self-pay

## 2023-07-10 NOTE — Telephone Encounter (Signed)
 Pharmacy Patient Advocate Encounter   Received notification from CoverMyMeds that prior authorization for Mounjaro  2.5MG /0.5ML auto-injectors  is required/requested.   Insurance verification completed.   The patient is insured through [facility] [employer] .   Per test claim: PA required; PA submitted to above mentioned insurance via CoverMyMeds Key/confirmation #/EOC [id number] Status is pending

## 2023-07-16 ENCOUNTER — Telehealth: Payer: Self-pay | Admitting: Pharmacist

## 2023-07-16 ENCOUNTER — Encounter: Payer: Self-pay | Admitting: Family Medicine

## 2023-07-16 ENCOUNTER — Other Ambulatory Visit: Payer: Self-pay | Admitting: Pharmacist

## 2023-07-16 DIAGNOSIS — E1122 Type 2 diabetes mellitus with diabetic chronic kidney disease: Secondary | ICD-10-CM

## 2023-07-16 NOTE — Telephone Encounter (Signed)
 He can have a sample of the 2.5 if we have any.

## 2023-07-16 NOTE — Telephone Encounter (Signed)
 Can you please check with Dr. Gollan, he is the prescribing provider. Thank you.

## 2023-07-16 NOTE — Progress Notes (Signed)
 Would you please consider reducing patient's rosuvastatin  dose to 10 mg daily or switching patient to atorvastatin?    Max recommended dose for CrCl <30 is 10 mg daily   Based on latest creatinine level of 2.09 on 04/10/2023, calculate CrCl ~27 ml/min.   Thank you!  Arthur Lash, PharmD, [facility], [facility], [facility] [phone]

## 2023-07-16 NOTE — Progress Notes (Signed)
 Would you please consider reducing patient's rosuvastatin  dose to 10 mg daily?   Max recommended dose for CrCl <30 is 10 mg daily  Based on latest creatinine level of 2.09 on 04/10/2023, calculate CrCl ~27 ml/min.   Lab Results  Component Value Date   CHOL 99 (L) 02/14/2023   HDL 45 02/14/2023   LDLCALC 31 02/14/2023   TRIG 133 02/14/2023   CHOLHDL 2.2 02/14/2023    Thank you!  Timoteo Force

## 2023-07-16 NOTE — Progress Notes (Signed)
07/16/2023 Name: Bobby Thomas MRN: [id number] DOB: Nov 30, 1940  Chief Complaint  Patient presents with   Medication Management    Bobby Thomas is a 83 y.o. year old male who presented for a telephone visit.   They were referred to the pharmacist by their PCP for assistance in managing diabetes and medication access.      Subjective:   Care Team: Primary Care Provider: Lamon Pillow, MD ; Next Scheduled Visit: 08/10/2023 Nephrologist: Gari Junior, MD; Next Scheduled Visit: 08/20/2023 Cardiologist: Devorah Fonder, MD Oncologist: [name], MD  Medication Access/Adherence  [facility] Pharmacy:  [facility] [address] [place] - [address] [place] [address] Phone: [phone] Fax: [phone]  [facility] - [place], [place] - [address] [place] [address] Phone: [phone] Fax: [phone]   Patient reports affordability concerns with their medications: No  Patient reports access/transportation concerns to their pharmacy: No  Patient reports adherence concerns with their medications:  No     Weekly pillbox as filled by spouse. Reports rarely misses doses     Diabetes:   Current medications:  - Farxiga  10 mg daily - glipizide  10 mg twice daily before breakfast and supper (30 minutes prior) - pioglitazone  45 mg daily - Mounjaro  2.5 mg once weekly (started 3 weeks ago)   Previous therapies tried/barriers: patient prefers to avoid injectable meditations; Janumet  (CKD), Rybelsus ; Tradjenta    Reports patient has had reduced appetite since started Mounjaro , eating meals, but not snacking as much. Tolerating well - Reports had an administration error with 1 dose of Mounjaro  (wasted pen) and has already reached out to PCP via MyChart to see if sample available  Current glucose readings: morning fasting reading ranging: 123-186   Patient denies hypoglycemic s/sx including dizziness, shakiness, sweating.    Statin therapy: rosuvastatin  40 mg daily + ezetimibe  10 mg daily    Current medication access support:  - Enrolled in patient assistance for Farxiga  from [place] through 02/04/2024     Objective:  Lab Results  Component Value Date   HGBA1C 9.8 (A) 05/16/2023    Lab Results  Component Value Date   CREATININE 2.09 (H) 04/10/2023   BUN 52 (H) 04/10/2023   NA 134 (L) 04/10/2023   K 4.1 04/10/2023   CL 99 04/10/2023   CO2 23 04/10/2023    Lab Results  Component Value Date   CHOL 99 (L) 02/14/2023   HDL 45 02/14/2023   LDLCALC 31 02/14/2023   TRIG 133 02/14/2023   CHOLHDL 2.2 02/14/2023    Medications Reviewed Today     Reviewed by Ardis Becton, RPH-CPP (Pharmacist) on 07/16/23 at 1229  Med List Status: <None>   Medication Order Taking? Sig Documenting Provider Last Dose Status Informant  apixaban  (ELIQUIS ) 2.5 MG TABS tablet [id number]  TAKE 1 TABLET BY MOUTH TWICE A DAY Gollan, Timothy J, MD  Active   dapagliflozin  propanediol (FARXIGA ) 10 MG TABS tablet [id number] Yes Take 1 tablet (10 mg total) by mouth daily before breakfast. [place]. Lamon Pillow, MD Taking Active   EPINEPHrine  0.3 mg/0.3 mL IJ SOAJ injection [id number]  Inject 0.3 mg into the muscle as needed. Lamon Pillow, MD  Active   ezetimibe  (ZETIA ) 10 MG tablet [id number]  Take 1 tablet (10 mg total) by mouth daily. Gollan, Timothy J, MD  Active   furosemide (LASIX) 40 MG tablet [id number]  Take 40 mg by mouth daily. [provider]  Active   glipiZIDE  (GLUCOTROL ) 10 MG tablet [id number] Yes Take 1 tablet (10 mg total) by mouth 2 (two) times daily before a meal. Lamon Pillow, MD Taking Active   LORazepam  (ATIVAN ) 1 MG tablet [id number]  TAKE 1/2 TO 1 TABLET BY MOUTH NIGHTLY AT BEDTIME Lamon Pillow, MD  Active   metoprolol  succinate (TOPROL -XL) 25 MG 24 hr tablet [id number]  Take 1 tablet (25 mg total) by mouth daily. Lamon Pillow, MD  Active    [facility] ULTRA test strip [id number]  USE TO CHECK BLOOD SUGAR DAILY FOR TYPE 2 DIABETES DX: E11.9 Lamon Pillow, MD  Active   pioglitazone  (ACTOS ) 45 MG tablet [id number] Yes TAKE 1 TABLET BY MOUTH EVERY DAY Lamon Pillow, MD Taking Active   rosuvastatin  (CRESTOR ) 40 MG tablet [id number]  Take 1 tablet (40 mg total) by mouth daily. Gollan, Timothy J, MD  Active   tirzepatide  (MOUNJARO ) 2.5 MG/0.5ML Pen [id number] Yes Inject 2.5 mg into the skin once a week. Lamon Pillow, MD Taking Active               Assessment/Plan:   Renal dose consideration: note based on latest renal function labs, calculate CrCl (using adjusted body weight): ~27 ml/min.  - Recommended maximum dose for rosuvastatin  in patients with CrCl <30 is rosuvastatin  10 mg daily             Send message to PCP to recommend this dose reduction  Diabetes: - Currently uncontrolled - Reviewed long term cardiovascular and renal outcomes of uncontrolled blood sugar - Reviewed goal A1c, goal fasting, and goal 2 hour post prandial glucose - Have reviewed dietary modifications including: - Recommend to check glucose, keep log of results and have this record to review at upcoming medical appointments. Patient to contact provider office sooner if needed for readings outside of established parameters or symptoms - Spouse has sent MyChart message to PCP to let provider know about wasted dose of Mounjaro . Advise that if no sample available from the office, to follow up with pharmacy for next fill date available for Mounjaro  refill through insurance   Follow Up Plan: Clinical Pharmacist will follow up with patient by telephone on 09/24/2023 at 8:30 AM    Arthur Lash, PharmD, [facility] [phone]

## 2023-07-16 NOTE — Patient Instructions (Signed)
Goals Addressed             This Visit's Progress    Pharmacy Goals       Our goal A1c is less than 7%. This corresponds with fasting sugars less than 130 and 2 hour after meal sugars less than 180. Please keep a log of your results when checking your blood sugar   Our goal bad cholesterol, or LDL, is less than 70 . This is why it is important to continue taking your rosuvastatin and ezetimibe   Thank you!  Wallace Cullens, PharmD, [facility] [phone]

## 2023-07-21 ENCOUNTER — Other Ambulatory Visit: Payer: Self-pay | Admitting: Emergency Medicine

## 2023-07-21 DIAGNOSIS — Z79899 Other long term (current) drug therapy: Secondary | ICD-10-CM

## 2023-07-21 MED ORDER — ROSUVASTATIN CALCIUM 10 MG PO TABS: 10.0000 mg | ORAL_TABLET | Freq: Every day | ORAL | 3 refills | Status: DC

## 2023-07-21 NOTE — Telephone Encounter (Signed)
 Returned call to Pt's wife regarding the recommendations of Dr. Gollan to reduce his Crestor  from 40mg  to 10mg  daily and continue taking Zetia .  Explained to pt's wife that the decrease in Crestor  is secondary to renal dysfunction per Dr. Gollan and that labs will be repeated in 6 months post reduction of medication.  Pt's wife verbalized understanding.  [facility].

## 2023-07-21 NOTE — Telephone Encounter (Signed)
 Pt's wife returning call to a nurse

## 2023-07-21 NOTE — Telephone Encounter (Signed)
 Left a message for the patient to call back.     Gollan, Timothy J, MD to Elvia Hammans, RN (Selected Message)     07/20/23  8:47 AM We recommend that he decrease his Crestor  down to 10, stay on Zetia  Change being made secondary to renal dysfunction Repeat lipid panel in 6 months time on the reduced dosing Thx TGollan

## 2023-07-24 ENCOUNTER — Ambulatory Visit: Payer: Medicare HMO | Admitting: Dermatology

## 2023-07-27 ENCOUNTER — Inpatient Hospital Stay
Admission: EM | Admit: 2023-07-27 | Discharge: 2023-07-30 | DRG: 871 | Disposition: A | Discharge status: Hospice | Attending: Internal Medicine | Admitting: Internal Medicine

## 2023-07-27 ENCOUNTER — Emergency Department

## 2023-07-27 ENCOUNTER — Other Ambulatory Visit: Payer: Self-pay

## 2023-07-27 DIAGNOSIS — Z66 Do not resuscitate: Secondary | ICD-10-CM | POA: Diagnosis not present

## 2023-07-27 DIAGNOSIS — A419 Sepsis, unspecified organism: Principal | ICD-10-CM | POA: Diagnosis present

## 2023-07-27 DIAGNOSIS — E872 Acidosis, unspecified: Secondary | ICD-10-CM | POA: Diagnosis present

## 2023-07-27 DIAGNOSIS — R1084 Generalized abdominal pain: Secondary | ICD-10-CM | POA: Diagnosis not present

## 2023-07-27 DIAGNOSIS — I129 Hypertensive chronic kidney disease with stage 1 through stage 4 chronic kidney disease, or unspecified chronic kidney disease: Secondary | ICD-10-CM | POA: Diagnosis not present

## 2023-07-27 DIAGNOSIS — R042 Hemoptysis: Secondary | ICD-10-CM | POA: Diagnosis not present

## 2023-07-27 DIAGNOSIS — D849 Immunodeficiency, unspecified: Secondary | ICD-10-CM | POA: Diagnosis not present

## 2023-07-27 DIAGNOSIS — Z515 Encounter for palliative care: Secondary | ICD-10-CM

## 2023-07-27 DIAGNOSIS — Z7901 Long term (current) use of anticoagulants: Secondary | ICD-10-CM

## 2023-07-27 DIAGNOSIS — J9601 Acute respiratory failure with hypoxia: Secondary | ICD-10-CM | POA: Diagnosis not present

## 2023-07-27 DIAGNOSIS — I480 Paroxysmal atrial fibrillation: Secondary | ICD-10-CM | POA: Diagnosis present

## 2023-07-27 DIAGNOSIS — Y92009 Unspecified place in unspecified non-institutional (private) residence as the place of occurrence of the external cause: Secondary | ICD-10-CM | POA: Diagnosis not present

## 2023-07-27 DIAGNOSIS — R6520 Severe sepsis without septic shock: Secondary | ICD-10-CM | POA: Diagnosis present

## 2023-07-27 DIAGNOSIS — Z8249 Family history of ischemic heart disease and other diseases of the circulatory system: Secondary | ICD-10-CM

## 2023-07-27 DIAGNOSIS — E785 Hyperlipidemia, unspecified: Secondary | ICD-10-CM | POA: Diagnosis not present

## 2023-07-27 DIAGNOSIS — A4102 Sepsis due to Methicillin resistant Staphylococcus aureus: Principal | ICD-10-CM | POA: Diagnosis present

## 2023-07-27 DIAGNOSIS — S2242XA Multiple fractures of ribs, left side, initial encounter for closed fracture: Secondary | ICD-10-CM | POA: Diagnosis present

## 2023-07-27 DIAGNOSIS — Z7951 Long term (current) use of inhaled steroids: Secondary | ICD-10-CM

## 2023-07-27 DIAGNOSIS — Z91030 Bee allergy status: Secondary | ICD-10-CM

## 2023-07-27 DIAGNOSIS — I10 Essential (primary) hypertension: Secondary | ICD-10-CM | POA: Diagnosis present

## 2023-07-27 DIAGNOSIS — I2510 Atherosclerotic heart disease of native coronary artery without angina pectoris: Secondary | ICD-10-CM | POA: Diagnosis present

## 2023-07-27 DIAGNOSIS — E871 Hypo-osmolality and hyponatremia: Secondary | ICD-10-CM | POA: Diagnosis present

## 2023-07-27 DIAGNOSIS — I714 Abdominal aortic aneurysm, without rupture, unspecified: Secondary | ICD-10-CM | POA: Diagnosis not present

## 2023-07-27 DIAGNOSIS — R17 Unspecified jaundice: Secondary | ICD-10-CM | POA: Diagnosis not present

## 2023-07-27 DIAGNOSIS — J189 Pneumonia, unspecified organism: Secondary | ICD-10-CM

## 2023-07-27 DIAGNOSIS — N1832 Chronic kidney disease, stage 3b: Secondary | ICD-10-CM | POA: Diagnosis present

## 2023-07-27 DIAGNOSIS — E1165 Type 2 diabetes mellitus with hyperglycemia: Secondary | ICD-10-CM | POA: Diagnosis present

## 2023-07-27 DIAGNOSIS — N184 Chronic kidney disease, stage 4 (severe): Secondary | ICD-10-CM | POA: Diagnosis not present

## 2023-07-27 DIAGNOSIS — Z7985 Long-term (current) use of injectable non-insulin antidiabetic drugs: Secondary | ICD-10-CM

## 2023-07-27 DIAGNOSIS — D630 Anemia in neoplastic disease: Secondary | ICD-10-CM | POA: Diagnosis present

## 2023-07-27 DIAGNOSIS — E1151 Type 2 diabetes mellitus with diabetic peripheral angiopathy without gangrene: Secondary | ICD-10-CM | POA: Diagnosis present

## 2023-07-27 DIAGNOSIS — Z833 Family history of diabetes mellitus: Secondary | ICD-10-CM

## 2023-07-27 DIAGNOSIS — Z9181 History of falling: Secondary | ICD-10-CM

## 2023-07-27 DIAGNOSIS — W19XXXA Unspecified fall, initial encounter: Secondary | ICD-10-CM | POA: Diagnosis present

## 2023-07-27 DIAGNOSIS — E1122 Type 2 diabetes mellitus with diabetic chronic kidney disease: Secondary | ICD-10-CM | POA: Diagnosis not present

## 2023-07-27 DIAGNOSIS — Z85828 Personal history of other malignant neoplasm of skin: Secondary | ICD-10-CM

## 2023-07-27 DIAGNOSIS — J15212 Pneumonia due to Methicillin resistant Staphylococcus aureus: Secondary | ICD-10-CM | POA: Diagnosis present

## 2023-07-27 DIAGNOSIS — C3490 Malignant neoplasm of unspecified part of unspecified bronchus or lung: Secondary | ICD-10-CM | POA: Diagnosis not present

## 2023-07-27 DIAGNOSIS — R7881 Bacteremia: Secondary | ICD-10-CM | POA: Diagnosis not present

## 2023-07-27 DIAGNOSIS — Z79899 Other long term (current) drug therapy: Secondary | ICD-10-CM

## 2023-07-27 DIAGNOSIS — C3492 Malignant neoplasm of unspecified part of left bronchus or lung: Secondary | ICD-10-CM | POA: Diagnosis present

## 2023-07-27 DIAGNOSIS — J9811 Atelectasis: Secondary | ICD-10-CM | POA: Diagnosis not present

## 2023-07-27 DIAGNOSIS — R0902 Hypoxemia: Secondary | ICD-10-CM | POA: Diagnosis not present

## 2023-07-27 DIAGNOSIS — C801 Malignant (primary) neoplasm, unspecified: Secondary | ICD-10-CM | POA: Diagnosis not present

## 2023-07-27 DIAGNOSIS — J90 Pleural effusion, not elsewhere classified: Secondary | ICD-10-CM | POA: Diagnosis not present

## 2023-07-27 DIAGNOSIS — Z885 Allergy status to narcotic agent status: Secondary | ICD-10-CM

## 2023-07-27 DIAGNOSIS — Z888 Allergy status to other drugs, medicaments and biological substances status: Secondary | ICD-10-CM

## 2023-07-27 DIAGNOSIS — R0689 Other abnormalities of breathing: Secondary | ICD-10-CM | POA: Diagnosis present

## 2023-07-27 DIAGNOSIS — I69320 Aphasia following cerebral infarction: Secondary | ICD-10-CM

## 2023-07-27 DIAGNOSIS — Z7984 Long term (current) use of oral hypoglycemic drugs: Secondary | ICD-10-CM

## 2023-07-27 DIAGNOSIS — I959 Hypotension, unspecified: Secondary | ICD-10-CM | POA: Diagnosis not present

## 2023-07-27 DIAGNOSIS — J168 Pneumonia due to other specified infectious organisms: Secondary | ICD-10-CM | POA: Diagnosis not present

## 2023-07-27 DIAGNOSIS — Z841 Family history of disorders of kidney and ureter: Secondary | ICD-10-CM

## 2023-07-27 DIAGNOSIS — R4701 Aphasia: Secondary | ICD-10-CM | POA: Diagnosis not present

## 2023-07-27 DIAGNOSIS — S40811A Abrasion of right upper arm, initial encounter: Secondary | ICD-10-CM | POA: Diagnosis present

## 2023-07-27 DIAGNOSIS — R590 Localized enlarged lymph nodes: Secondary | ICD-10-CM | POA: Diagnosis not present

## 2023-07-27 DIAGNOSIS — Z7401 Bed confinement status: Secondary | ICD-10-CM | POA: Diagnosis not present

## 2023-07-27 DIAGNOSIS — M4856XA Collapsed vertebra, not elsewhere classified, lumbar region, initial encounter for fracture: Secondary | ICD-10-CM | POA: Diagnosis not present

## 2023-07-27 LAB — URINALYSIS, ROUTINE W REFLEX MICROSCOPIC
Bacteria, UA: NONE SEEN
Bilirubin Urine: NEGATIVE
Glucose, UA: >=500 mg/dL — AB
Ketones, ur: NEGATIVE mg/dL
Leukocytes,Ua: NEGATIVE
Nitrite: NEGATIVE
Protein, ur: 30 mg/dL — AB
RBC / HPF: 0 RBC/hpf (ref 0–5)
Specific Gravity, Urine: 1.014 (ref 1.005–1.030)
Squamous Epithelial / HPF: 0 /HPF (ref 0–5)
pH: 5 (ref 5.0–8.0)

## 2023-07-27 LAB — CBC
HCT: 36.2 % — ABNORMAL LOW (ref 39.0–52.0)
Hemoglobin: 12.2 g/dL — ABNORMAL LOW (ref 13.0–17.0)
MCH: 29.5 pg (ref 26.0–34.0)
MCHC: 33.7 g/dL (ref 30.0–36.0)
MCV: 87.4 fL (ref 80.0–100.0)
Platelets: 351 10*3/uL (ref 150–400)
RBC: 4.14 MIL/uL — ABNORMAL LOW (ref 4.22–5.81)
RDW: 16.3 % — ABNORMAL HIGH (ref 11.5–15.5)
WBC: 32.7 10*3/uL — ABNORMAL HIGH (ref 4.0–10.5)
nRBC: 0.3 % — ABNORMAL HIGH (ref 0.0–0.2)

## 2023-07-27 LAB — LIPASE, BLOOD: Lipase: 27 U/L (ref 11–51)

## 2023-07-27 LAB — MAGNESIUM: Magnesium: 1.9 mg/dL (ref 1.7–2.4)

## 2023-07-27 LAB — COMPREHENSIVE METABOLIC PANEL WITH GFR
ALT: 13 U/L (ref 0–44)
AST: 19 U/L (ref 15–41)
Albumin: 3.3 g/dL — ABNORMAL LOW (ref 3.5–5.0)
Alkaline Phosphatase: 46 U/L (ref 38–126)
Anion gap: 13 (ref 5–15)
BUN: 36 mg/dL — ABNORMAL HIGH (ref 8–23)
CO2: 29 mmol/L (ref 22–32)
Calcium: 8.8 mg/dL — ABNORMAL LOW (ref 8.9–10.3)
Chloride: 90 mmol/L — ABNORMAL LOW (ref 98–111)
Creatinine, Ser: 1.93 mg/dL — ABNORMAL HIGH (ref 0.61–1.24)
GFR, Estimated: 35 mL/min — ABNORMAL LOW (ref >60)
Glucose, Bld: 265 mg/dL — ABNORMAL HIGH (ref 70–99)
Potassium: 2.7 mmol/L — CL (ref 3.5–5.1)
Sodium: 132 mmol/L — ABNORMAL LOW (ref 135–145)
Total Bilirubin: 1.8 mg/dL — ABNORMAL HIGH (ref 0.0–1.2)
Total Protein: 7.9 g/dL (ref 6.5–8.1)

## 2023-07-27 LAB — PROCALCITONIN: Procalcitonin: 0.32 ng/mL

## 2023-07-27 LAB — LACTIC ACID, PLASMA
Lactic Acid, Venous: 2 mmol/L (ref 0.5–1.9)
Lactic Acid, Venous: 2.3 mmol/L (ref 0.5–1.9)

## 2023-07-27 MED ORDER — METRONIDAZOLE 500 MG/100ML IV SOLN
500.0000 mg | Freq: Once | INTRAVENOUS | Status: AC
  Administered 2023-07-27: 500 mg via INTRAVENOUS
  Filled 2023-07-27: qty 100

## 2023-07-27 MED ORDER — IOHEXOL 300 MG/ML  SOLN
80.0000 mL | Freq: Once | INTRAMUSCULAR | Status: AC | PRN
  Administered 2023-07-27: 80 mL via INTRAVENOUS

## 2023-07-27 MED ORDER — SODIUM CHLORIDE 0.9 % IV SOLN
500.0000 mg | INTRAVENOUS | Status: DC
  Administered 2023-07-27: 500 mg via INTRAVENOUS
  Filled 2023-07-27 (×2): qty 5

## 2023-07-27 MED ORDER — LACTATED RINGERS IV SOLN: INTRAVENOUS | Status: DC

## 2023-07-27 MED ORDER — POTASSIUM CHLORIDE 10 MEQ/100ML IV SOLN
10.0000 meq | Freq: Once | INTRAVENOUS | Status: AC
  Administered 2023-07-27: 10 meq via INTRAVENOUS
  Filled 2023-07-27: qty 100

## 2023-07-27 MED ORDER — ONDANSETRON HCL 4 MG PO TABS: 4.0000 mg | ORAL_TABLET | Freq: Four times a day (QID) | ORAL | Status: DC | PRN

## 2023-07-27 MED ORDER — LORAZEPAM 0.5 MG PO TABS
0.5000 mg | ORAL_TABLET | Freq: Every evening | ORAL | Status: DC | PRN
  Administered 2023-07-27 – 2023-07-30 (×3): 1 mg via ORAL
  Filled 2023-07-27 (×3): qty 2

## 2023-07-27 MED ORDER — ONDANSETRON HCL 4 MG/2ML IJ SOLN: 4.0000 mg | Freq: Four times a day (QID) | INTRAMUSCULAR | Status: DC | PRN

## 2023-07-27 MED ORDER — METOPROLOL SUCCINATE ER 25 MG PO TB24
25.0000 mg | ORAL_TABLET | Freq: Every day | ORAL | Status: DC
  Administered 2023-07-28 – 2023-07-30 (×3): 25 mg via ORAL
  Filled 2023-07-27 (×3): qty 1

## 2023-07-27 MED ORDER — SODIUM CHLORIDE 0.9 % IV SOLN: 2.0000 g | INTRAVENOUS | Status: DC

## 2023-07-27 MED ORDER — GLIPIZIDE 10 MG PO TABS
10.0000 mg | ORAL_TABLET | Freq: Two times a day (BID) | ORAL | Status: DC
  Filled 2023-07-27: qty 1

## 2023-07-27 MED ORDER — INSULIN ASPART 100 UNIT/ML IJ SOLN
0.0000 [IU] | Freq: Three times a day (TID) | INTRAMUSCULAR | Status: DC
  Administered 2023-07-28 (×3): 3 [IU] via SUBCUTANEOUS
  Administered 2023-07-29 – 2023-07-30 (×4): 2 [IU] via SUBCUTANEOUS
  Filled 2023-07-27 (×7): qty 1

## 2023-07-27 MED ORDER — ACETAMINOPHEN 325 MG PO TABS
650.0000 mg | ORAL_TABLET | Freq: Four times a day (QID) | ORAL | Status: DC | PRN
  Administered 2023-07-27: 650 mg via ORAL
  Filled 2023-07-27: qty 2

## 2023-07-27 MED ORDER — PIOGLITAZONE HCL 30 MG PO TABS
45.0000 mg | ORAL_TABLET | Freq: Every day | ORAL | Status: DC
  Filled 2023-07-27: qty 1

## 2023-07-27 MED ORDER — EZETIMIBE 10 MG PO TABS
10.0000 mg | ORAL_TABLET | Freq: Every day | ORAL | Status: DC
  Administered 2023-07-28 – 2023-07-30 (×3): 10 mg via ORAL
  Filled 2023-07-27 (×3): qty 1

## 2023-07-27 MED ORDER — LEVALBUTEROL HCL 0.63 MG/3ML IN NEBU
0.6300 mg | INHALATION_SOLUTION | Freq: Three times a day (TID) | RESPIRATORY_TRACT | Status: DC
  Administered 2023-07-27 – 2023-07-30 (×9): 0.63 mg via RESPIRATORY_TRACT
  Filled 2023-07-27 (×9): qty 3

## 2023-07-27 MED ORDER — APIXABAN 2.5 MG PO TABS
2.5000 mg | ORAL_TABLET | Freq: Two times a day (BID) | ORAL | Status: DC
  Administered 2023-07-27 – 2023-07-30 (×6): 2.5 mg via ORAL
  Filled 2023-07-27 (×6): qty 1

## 2023-07-27 MED ORDER — SODIUM CHLORIDE 0.9 % IV SOLN
2.0000 g | Freq: Once | INTRAVENOUS | Status: AC
  Administered 2023-07-27: 2 g via INTRAVENOUS
  Filled 2023-07-27: qty 12.5

## 2023-07-27 MED ORDER — MAGNESIUM SULFATE 2 GM/50ML IV SOLN
2.0000 g | Freq: Once | INTRAVENOUS | Status: AC
  Administered 2023-07-27: 2 g via INTRAVENOUS
  Filled 2023-07-27: qty 50

## 2023-07-27 MED ORDER — ROSUVASTATIN CALCIUM 10 MG PO TABS
10.0000 mg | ORAL_TABLET | Freq: Every day | ORAL | Status: DC
  Administered 2023-07-28 – 2023-07-30 (×3): 10 mg via ORAL
  Filled 2023-07-27 (×3): qty 1

## 2023-07-27 MED ORDER — VANCOMYCIN HCL IN DEXTROSE 1-5 GM/200ML-% IV SOLN
1000.0000 mg | Freq: Once | INTRAVENOUS | Status: AC
  Administered 2023-07-27: 1000 mg via INTRAVENOUS
  Filled 2023-07-27: qty 200

## 2023-07-27 MED ORDER — POTASSIUM CHLORIDE CRYS ER 20 MEQ PO TBCR
40.0000 meq | EXTENDED_RELEASE_TABLET | Freq: Once | ORAL | Status: AC
  Administered 2023-07-27: 40 meq via ORAL
  Filled 2023-07-27: qty 2

## 2023-07-27 MED ORDER — DAPAGLIFLOZIN PROPANEDIOL 10 MG PO TABS
10.0000 mg | ORAL_TABLET | Freq: Every day | ORAL | Status: DC
  Filled 2023-07-27: qty 1

## 2023-07-27 MED ORDER — ACETAMINOPHEN 500 MG PO TABS
1000.0000 mg | ORAL_TABLET | Freq: Once | ORAL | Status: AC
  Administered 2023-07-27: 1000 mg via ORAL
  Filled 2023-07-27: qty 2

## 2023-07-27 MED ORDER — ACETAMINOPHEN 650 MG RE SUPP: 650.0000 mg | Freq: Four times a day (QID) | RECTAL | Status: DC | PRN

## 2023-07-27 MED ORDER — FENTANYL CITRATE PF 50 MCG/ML IJ SOSY
50.0000 ug | PREFILLED_SYRINGE | Freq: Once | INTRAMUSCULAR | Status: AC
  Administered 2023-07-27: 50 ug via INTRAVENOUS
  Filled 2023-07-27: qty 1

## 2023-07-27 MED ORDER — LACTATED RINGERS IV BOLUS (SEPSIS)
1000.0000 mL | Freq: Once | INTRAVENOUS | Status: AC
Start: 2023-07-27 — End: 2023-07-27
  Administered 2023-07-27: 1000 mL via INTRAVENOUS

## 2023-07-27 MED ORDER — POTASSIUM CHLORIDE IN NACL 40-0.9 MEQ/L-% IV SOLN
INTRAVENOUS | Status: AC
  Filled 2023-07-27: qty 1000
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
## 2023-07-27 NOTE — Progress Notes (Signed)
 [facility] Liaison Note  This patient is currently followed by [facility] palliative care services.  [facility] will follow through discharge disposition.  Please call with any Hospice related questions or concerns  [facility] Liaison [phone]

## 2023-07-27 NOTE — Consult Note (Signed)
 CODE SEPSIS - PHARMACY COMMUNICATION  **Broad Spectrum Antibiotics should be administered within 1 hour of Sepsis diagnosis**  Time Code Sepsis Called/Page Received: 1622  Antibiotics Ordered: cefepime, vancomycin, and Flagyl  Time of 1st antibiotic administration: 1654  Additional action taken by pharmacy: N/A  Kayla JULIANNA Blew ,PharmD Clinical Pharmacist  07/27/2023  4:24 PM

## 2023-07-27 NOTE — ED Triage Notes (Signed)
Refer to First Nurse Note

## 2023-07-27 NOTE — ED Triage Notes (Signed)
 Pt comes with c/o left belly pain that started today. Pt does have terminal cancer in lungs. Pt is in middle of getting hospice set up. Pt states pain in belly and some weakness.   Wife is wanting DNR and has paperwork.   140/62 91% RA 95                          Pt is alert to self. Pt has hx cva and deficit of aphasia.

## 2023-07-27 NOTE — Sepsis Progress Note (Signed)
 Elink monitoring for the code sepsis protocol.

## 2023-07-27 NOTE — ED Provider Notes (Signed)
 [facility] Provider Note    Event Date/Time   First MD Initiated Contact with Patient 07/27/23 1607     (approximate)   History   Abdominal Pain   HPI Bobby Thomas is a 83 y.o. 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  Patient here with wife who provides most of the story.  Onset of symptoms with left-sided abdominal pain since 3 AM this morning.  Worsening.  Not tolerating p.o. but does not specifically describe any nausea or vomiting.  Decreased bowel movements.  Chronic hemoptysis which is unchanged today.  Otherwise denies chest pain, shortness of breath, dysuria, diarrhea.  Reviewed last oncology note on 04/17/2023.  Patient with stage III lung cancer.  Recommended ongoing chemotherapy with radiation at that time.  They had elected not to pursue any further treatment and referred to palliative care.  Not currently on hospice care.   Wife does wish for him to be a DNR in terms of chest compressions and intubation but is okay with antibiotics and pressors if needed.     Physical Exam   Triage Vital Signs: ED Triage Vitals  Encounter Vitals Group     BP 07/27/23 1554 (!) 106/37     Girls Systolic BP Percentile --      Girls Diastolic BP Percentile --      Boys Systolic BP Percentile --      Boys Diastolic BP Percentile --      Pulse Rate 07/27/23 1554 (!) 103     Resp 07/27/23 1554 18     Temp 07/27/23 1554 98 F (36.7 C)     Temp Source 07/27/23 1554 Oral     SpO2 07/27/23 1554 95 %     Weight 07/27/23 1555 170 lb (77.1 kg)     Height 07/27/23 1555 5' 7 (1.702 m)     Head Circumference --      Peak Flow --      Pain Score 07/27/23 1555 8     Pain Loc --      Pain Education --      Exclude from Growth Chart --     Most recent vital signs: Vitals:   07/27/23 1615 07/27/23 1617  BP:  133/65  Pulse: 99   Resp: (!) 25   Temp: (!) 101.7 F (38.7 C)   SpO2: 93%     Physical Exam: I have reviewed the vital signs and nursing notes. General: Awake, alert, no acute distress.  Nontoxic appearing. Head:  Atraumatic, normocephalic.   ENT:  EOM intact, PERRL. Oral mucosa is pink and moist with no lesions. Neck: Neck is supple with full range of motion, No meningeal signs. Cardiovascular:  RRR, No murmurs. Peripheral pulses palpable and equal bilaterally. Respiratory:  Symmetrical chest wall expansion.  No rhonchi, rales, or wheezes.  Good air movement throughout.  No use of accessory muscles.   Musculoskeletal:  No cyanosis or edema. Moving extremities with full ROM Abdomen:  Soft, tenderness to palpation in epigastric, left upper quadrant, and left lower quadrant, nondistended. Neuro:  GCS 15, moving all four extremities, interacting appropriately. Speech clear. Psych:  Calm, appropriate.   Skin:  Warm, dry, no rash.    ED Results / Procedures / Treatments   Labs (all labs ordered are listed, but only abnormal results are displayed) Labs Reviewed  COMPREHENSIVE METABOLIC PANEL WITH GFR - Abnormal; Notable for the following components:  Result Value   Sodium 132 (*)    Potassium 2.7 (*)    Chloride 90 (*)    Glucose, Bld 265 (*)    BUN 36 (*)    Creatinine, Ser 1.93 (*)    Calcium  8.8 (*)    Albumin  3.3 (*)    Total Bilirubin 1.8 (*)    GFR, Estimated 35 (*)    All other components within normal limits  CBC - Abnormal; Notable for the following components:   WBC 32.7 (*)    RBC 4.14 (*)    Hemoglobin 12.2 (*)    HCT 36.2 (*)    RDW 16.3 (*)    nRBC 0.3 (*)    All other components within normal limits  URINALYSIS, ROUTINE W REFLEX MICROSCOPIC - Abnormal; Notable for the following components:   Color, Urine YELLOW (*)    APPearance CLEAR (*)    Glucose, UA >=500 (*)    Hgb urine dipstick SMALL (*)    Protein, ur 30 (*)    All other components within normal limits  LACTIC ACID, PLASMA - Abnormal; Notable for the following  components:   Lactic Acid, Venous 2.0 (*)    All other components within normal limits  CULTURE, BLOOD (ROUTINE X 2)  CULTURE, BLOOD (ROUTINE X 2)  LIPASE, BLOOD  LACTIC ACID, PLASMA     EKG    RADIOLOGY Independently interpreted CT chest/abdomen/pelvis showing left side lower pneumonia, multiple rib fractures   PROCEDURES:  Critical Care performed: Yes, see critical care procedure note(s)  .Critical Care  Performed by: Malvina Alm DASEN, MD Authorized by: Malvina Alm DASEN, MD   Critical care provider statement:    Critical care time (minutes):  30   Critical care was necessary to treat or prevent imminent or life-threatening deterioration of the following conditions:  Sepsis   Critical care was time spent personally by me on the following activities:  Development of treatment plan with patient or surrogate, discussions with consultants, evaluation of patient's response to treatment, examination of patient, ordering and review of laboratory studies, ordering and review of radiographic studies, ordering and performing treatments and interventions, pulse oximetry, re-evaluation of patient's condition and review of old charts   I assumed direction of critical care for this patient from another provider in my specialty: no     Care discussed with: admitting provider      MEDICATIONS ORDERED IN ED: Medications  lactated ringers  infusion (has no administration in time range)  lactated ringers  bolus 1,000 mL (1,000 mLs Intravenous New Bag/Given 07/27/23 1719)  metroNIDAZOLE (FLAGYL) IVPB 500 mg (500 mg Intravenous New Bag/Given 07/27/23 1719)  vancomycin (VANCOCIN) IVPB 1000 mg/200 mL premix (has no administration in time range)  potassium chloride  10 mEq in 100 mL IVPB (10 mEq Intravenous New Bag/Given 07/27/23 1653)  ceFEPIme (MAXIPIME) 2 g in sodium chloride  0.9 % 100 mL IVPB (0 g Intravenous Stopped 07/27/23 1718)  fentaNYL  (SUBLIMAZE ) injection 50 mcg (50 mcg Intravenous Given 07/27/23  1646)  acetaminophen  (TYLENOL ) tablet 1,000 mg (1,000 mg Oral Given 07/27/23 1645)  iohexol  (OMNIPAQUE ) 300 MG/ML solution 80 mL (80 mLs Intravenous Contrast Given 07/27/23 1701)     IMPRESSION / MDM / ASSESSMENT AND PLAN / ED COURSE  I reviewed the triage vital signs and the nursing notes.                              Differential diagnosis includes, but is  not limited to, pancreatitis, metastatic lung cancer spread, pneumonia, sepsis, appendicitis, diverticulitis, SBO  Patient's presentation is most consistent with acute presentation with potential threat to life or bodily function.  Patient is an 83 year old male presenting today for left-sided abdominal pain.  Vital signs on arrival show fever and tachycardia meeting sepsis criteria.  Found to have a leukocytosis of 32.7.  Started on sepsis protocol with broad-spectrum antibiotics, 1 L fluid, and blood cultures.  Also given Tylenol  and fentanyl  for pain and fever symptoms.  Separately, CT abdomen/pelvis along with chest was ordered to rule out any acute intrathoracic or intra-abdominal infections as likely source.  Discussed CODE STATUS with wife at bedside who wants him to be DNR/DNI but is okay with antibiotics and pressors.  CT abdomen/pelvis/chest shows evidence of left lower lobe pneumonia.  He also has multiple rib fractures in the left lower side which are subacute.  Family noted he had a fall around June 1.  Will admit to the hospitalist at this time for sepsis secondary to pneumonia.  The patient is on the cardiac monitor to evaluate for evidence of arrhythmia and/or significant heart rate changes. Clinical Course as of 07/27/23 1747  Sun Jul 27, 2023  1633 Potassium(!!): 2.7 IV potassium repletion given [DW]    Clinical Course User Index [DW] Malvina Alm DASEN, MD     FINAL CLINICAL IMPRESSION(S) / ED DIAGNOSES   Final diagnoses:  Sepsis, due to unspecified organism, unspecified whether acute organ dysfunction present [facility])   Pneumonia of left lower lobe due to infectious organism  Closed fracture of multiple ribs of left side, initial encounter     Rx / DC Orders   ED Discharge Orders     None        Note:  This document was prepared using Dragon voice recognition software and may include unintentional dictation errors.   Malvina Alm DASEN, MD 07/27/23 [phone]

## 2023-07-28 DIAGNOSIS — J189 Pneumonia, unspecified organism: Secondary | ICD-10-CM | POA: Diagnosis not present

## 2023-07-28 DIAGNOSIS — A419 Sepsis, unspecified organism: Secondary | ICD-10-CM | POA: Diagnosis not present

## 2023-07-28 LAB — COMPREHENSIVE METABOLIC PANEL WITH GFR
ALT: 12 U/L (ref 0–44)
AST: 17 U/L (ref 15–41)
Albumin: 2.6 g/dL — ABNORMAL LOW (ref 3.5–5.0)
Alkaline Phosphatase: 41 U/L (ref 38–126)
Anion gap: 11 (ref 5–15)
BUN: 32 mg/dL — ABNORMAL HIGH (ref 8–23)
CO2: 24 mmol/L (ref 22–32)
Calcium: 8.3 mg/dL — ABNORMAL LOW (ref 8.9–10.3)
Chloride: 99 mmol/L (ref 98–111)
Creatinine, Ser: 1.77 mg/dL — ABNORMAL HIGH (ref 0.61–1.24)
GFR, Estimated: 38 mL/min — ABNORMAL LOW (ref >60)
Glucose, Bld: 190 mg/dL — ABNORMAL HIGH (ref 70–99)
Potassium: 3.5 mmol/L (ref 3.5–5.1)
Sodium: 134 mmol/L — ABNORMAL LOW (ref 135–145)
Total Bilirubin: 2.1 mg/dL — ABNORMAL HIGH (ref 0.0–1.2)
Total Protein: 6.7 g/dL (ref 6.5–8.1)

## 2023-07-28 LAB — STREP PNEUMONIAE URINARY ANTIGEN: Strep Pneumo Urinary Antigen: NEGATIVE

## 2023-07-28 LAB — MRSA NEXT GEN BY PCR, NASAL: MRSA by PCR Next Gen: NOT DETECTED

## 2023-07-28 LAB — CBC
HCT: 32.9 % — ABNORMAL LOW (ref 39.0–52.0)
Hemoglobin: 11.1 g/dL — ABNORMAL LOW (ref 13.0–17.0)
MCH: 29.7 pg (ref 26.0–34.0)
MCHC: 33.7 g/dL (ref 30.0–36.0)
MCV: 88 fL (ref 80.0–100.0)
Platelets: 319 10*3/uL (ref 150–400)
RBC: 3.74 MIL/uL — ABNORMAL LOW (ref 4.22–5.81)
RDW: 16.6 % — ABNORMAL HIGH (ref 11.5–15.5)
WBC: 40.9 10*3/uL — ABNORMAL HIGH (ref 4.0–10.5)
nRBC: 0.2 % (ref 0.0–0.2)

## 2023-07-28 LAB — LACTIC ACID, PLASMA: Lactic Acid, Venous: 1.9 mmol/L (ref 0.5–1.9)

## 2023-07-28 LAB — GLUCOSE, CAPILLARY
Glucose-Capillary: 203 mg/dL — ABNORMAL HIGH (ref 70–99)
Glucose-Capillary: 213 mg/dL — ABNORMAL HIGH (ref 70–99)

## 2023-07-28 LAB — CBG MONITORING, ED: Glucose-Capillary: 222 mg/dL — ABNORMAL HIGH (ref 70–99)

## 2023-07-28 MED ORDER — SODIUM CHLORIDE 0.9 % IV SOLN
500.0000 mg | INTRAVENOUS | Status: DC
  Administered 2023-07-28 – 2023-07-29 (×2): 500 mg via INTRAVENOUS
  Filled 2023-07-28 (×3): qty 5

## 2023-07-28 MED ORDER — SODIUM CHLORIDE 0.9 % IV SOLN
2.0000 g | Freq: Two times a day (BID) | INTRAVENOUS | Status: DC
  Administered 2023-07-28 – 2023-07-30 (×5): 2 g via INTRAVENOUS
  Filled 2023-07-28 (×5): qty 12.5

## 2023-07-28 MED ORDER — OXYCODONE HCL 5 MG PO TABS
5.0000 mg | ORAL_TABLET | Freq: Four times a day (QID) | ORAL | Status: DC | PRN
  Administered 2023-07-28 (×2): 5 mg via ORAL
  Filled 2023-07-28 (×2): qty 1

## 2023-07-28 NOTE — ED Notes (Signed)
Informed RN bed assigned 

## 2023-07-28 NOTE — Progress Notes (Signed)
 Wounds present on transfer to 2A from ED   Left leg by knee blood blister 3rd toe left foot blood blister Scabbing on R upper/lower Abrasion/bruising on left elbow and upper arm

## 2023-07-28 NOTE — Plan of Care (Signed)

## 2023-07-28 NOTE — Inpatient Diabetes Management (Signed)
 Inpatient Diabetes Program Recommendations  AACE/ADA: New Consensus Statement on Inpatient Glycemic Control (2015)  Target Ranges:  Prepandial:   less than 140 mg/dL      Peak postprandial:   less than 180 mg/dL (1-2 hours)      Critically ill patients:  140 - 180 mg/dL    Latest Reference Range & Units 07/27/23 15:41 07/28/23 05:03  Glucose 70 - 99 mg/dL 734 (H) 809 (H)   Review of Glycemic Control  Diabetes history: DM2 Outpatient Diabetes medications: Glipizide  10 mg BID, Farxiga  10 mg daily, Actos  45 mg daily, Mounjaro  2.5 mg Qweek Current orders for Inpatient glycemic control: Actos  45 mg daily, Farxiga  10 mg QAM, Glipizide  10 mg BID, Novolog  0-9 units TID with meals  Inpatient Diabetes Program Recommendations:    Insulin : While inpatient, please consider discontinuing Actos  and Glipizide . Please consider adding Novolog  0-5 units at bedtime.  NOTE: Per chart review, patient recently started taking Mounjaro  2.5 g Qweek on 07/16/23.  Thanks, Earnie Gainer, RN, MSN, CDCES Diabetes Coordinator Inpatient Diabetes Program [phone] (Team Pager from 8am to 5pm)

## 2023-07-28 NOTE — Progress Notes (Signed)
 PROGRESS NOTE  LASHUN MCCANTS  FMW:[id number] DOB: [birth year] DOA: 07/27/2023 PCP: Gasper Nancyann BRAVO, MD  Consultants  Brief Narrative: 83 y.o. male with a PMH significant for atrial fibrillation, actinic keratosis, allergy, anxiety, history of small bowel obstruction, carotid artery disease, coronary artery disease, hypertension, hyperlipidemia, type 2 diabetes, history of CVA, aphasia, peripheral arterial disease, stage IIIb CKD, clotting disorder, stage IV lung cancer who presented to the emergency department with a left-sided abdominal pain since earlier today. He has been having sputum with occasional hemoptysis. He denied fever, chills, rhinorrhea, sore throat or wheezing. No chest pain, palpitations, diaphoresis, PND, orthopnea or pitting edema of the lower extremities. Positive left-sided abdominal pain, but no nausea, emesis, diarrhea, constipation, melena or hematochezia. No flank pain, dysuria, frequency or hematuria. No polyuria, polydipsia, polyphagia or blurred vision.    Assessment & Plan: Acute respiratory failure secondary to PNA: - Slightly tachypneic, taking shallow breaths due to rib fx pain, see below - Initially on CTX/azithro for CAP-->broadened to cefepime in light of immunocompromised status secondary to lung CA - Sepsis appears to be resolved.  Will recheck lactate to ensure.  Vitals good.  - Scheduled and as needed bronchodilators which he feels are helpful . - cultures and strep urinary antigen pending.   Rib fractures: - fall at home onto chair at beginning of June - ongoing pain since then - CT revealed sub-acute rib fx's consistent with fall.   - has made it harder to take deep breaths which likely contributed to atelectasis and likely PNA as well.  - continue pain control.  He's limited by allergies as to what he can take.  - Rib binder for support.   Leukocytosis: - grossly elevated.  40.9K up from 32.7K on admission - Last WBC we have back in March was  14.3 - Will continue to trend.   - low threshold to repeat/broaden imaging search of WBC remains elevated and patient not improving.    Stage III lung cancer; - [facility]  - has declined therapy   CKD Stage III: - Creatinine at his baseline.   - will continue to trend.    Hyponatremia: - likely hypotonic. - mild, asx.   - will trend.  Continue oral hydration.   Hyperbilirubinemia: - notably elevated.  Uptrending from admission.  - will follow      HLD (hyperlipidemia) Continue ezetimibe  10 mg p.o. daily. Continue rosuvastatin  10 mg p.o. daily.     Essential hypertension Hold furosemide. Resume metoprolol  succinate 25 mg p.o. in the morning. BP remains acceptable here.      Intermittent atrial fibrillation (HCC) CHA?DS?-VASc Score of 8. Continue apixaban  2.5 mg p.o. twice daily. Continue metoprolol  succinate 25 mg p.o. daily.     Type 2 diabetes mellitus with diabetic chronic kidney disease (HCC) Carbohydrate modified diet. - Stopping home hypoglycemics and placed on insulin  here - >500 glucose in urine.  - A1C > 9.8     Hemoptysis In the setting of lung cancer and apixaban  use.  Mild.  Will continue apixaban  Reports this as chronic and has been documented going back to March at least of this year.  - Follow Hgb.  Decreased from admission, but only slight drop 12.2-->11.1    DVT prophylaxis:  apixaban  (ELIQUIS ) tablet 2.5 mg Start: 07/27/23 2200 apixaban  (ELIQUIS ) tablet 2.5 mg  Code Status:   Code Status: Limited: Do not attempt resuscitation (DNR) -DNR-LIMITED -Do Not Intubate/DNI  Family Communication: Daughter at bedside, all questions answered Level  of care: Progressive Status is: Inpatient Dispo: Likely in house for at least 48 hours IV abx and improving in sx's/leukocytosis.   Consults called: none   Subjective: Patient complaining of Left sided rib pain.  Otherwise hungry.  Reports breathing not at baseline, can't take deep breaths due  to his pain.  Otherwise denies complaints.   Objective: Vitals:   07/28/23 1134 07/28/23 1216 07/28/23 1403 07/28/23 1607  BP: 122/77   (!) 143/54  Pulse: 94   98  Resp: 18 (!) 23  20  Temp: 98.6 F (37 C)   97.8 F (36.6 C)  TempSrc:      SpO2: 95%  96% 93%  Weight:      Height:        Intake/Output Summary (Last 24 hours) at 07/28/2023 1635 Last data filed at 07/28/2023 1113 Gross per 24 hour  Intake 1155.74 ml  Output 800 ml  Net 355.74 ml   Filed Weights   07/27/23 1555  Weight: 77.1 kg   Body mass index is 26.63 kg/m.  Gen: 83 y.o. male in no apparent distress.  Nontoxic Pulm: Non-labored breathing, mildly tachypneic.  No crackles CV: Regular rate and rhythm. Chest:  TTP Left lower ribs with palpation  GI: Abdomen soft, non-tender, non-distended Ext: Warm, no deformities, Skin: No rashes, lesions  Neuro: Alert and oriented. No focal neurological deficits. Psych: Calm  Judgement and insight appear normal. Mood & affect appropriate.     I have personally reviewed the following labs and images: CBC: Recent Labs  Lab 07/27/23 1541 07/28/23 0503  WBC 32.7* 40.9*  HGB 12.2* 11.1*  HCT 36.2* 32.9*  MCV 87.4 88.0  PLT 351 319   BMP &GFR Recent Labs  Lab 07/27/23 1541 07/28/23 0503  NA 132* 134*  K 2.7* 3.5  CL 90* 99  CO2 29 24  GLUCOSE 265* 190*  BUN 36* 32*  CREATININE 1.93* 1.77*  CALCIUM  8.8* 8.3*  MG 1.9  --    Estimated Creatinine Clearance: 31.1 mL/min (A) (by C-G formula based on SCr of 1.77 mg/dL (H)). Liver & Pancreas: Recent Labs  Lab 07/27/23 1541 07/28/23 0503  AST 19 17  ALT 13 12  ALKPHOS 46 41  BILITOT 1.8* 2.1*  PROT 7.9 6.7  ALBUMIN  3.3* 2.6*   Recent Labs  Lab 07/27/23 1541  LIPASE 27   No results for input(s): AMMONIA in the last 168 hours. Diabetic: No results for input(s): HGBA1C in the last 72 hours. Recent Labs  Lab 07/28/23 0938 07/28/23 1134 07/28/23 1607  GLUCAP 222* 203* 213*   Cardiac  Enzymes: No results for input(s): CKTOTAL, CKMB, CKMBINDEX, TROPONINI in the last 168 hours. No results for input(s): PROBNP in the last 8760 hours. Coagulation Profile: No results for input(s): INR, PROTIME in the last 168 hours. Thyroid  Function Tests: No results for input(s): TSH, T4TOTAL, FREET4, T3FREE, THYROIDAB in the last 72 hours. Lipid Profile: No results for input(s): CHOL, HDL, LDLCALC, TRIG, CHOLHDL, LDLDIRECT in the last 72 hours. Anemia Panel: No results for input(s): VITAMINB12, FOLATE, FERRITIN, TIBC, IRON, RETICCTPCT in the last 72 hours. Urine analysis:    Component Value Date/Time   COLORURINE YELLOW (A) 07/27/2023 1541   APPEARANCEUR CLEAR (A) 07/27/2023 1541   LABSPEC 1.014 07/27/2023 1541   PHURINE 5.0 07/27/2023 1541   GLUCOSEU >=500 (A) 07/27/2023 1541   HGBUR SMALL (A) 07/27/2023 1541   BILIRUBINUR NEGATIVE 07/27/2023 1541   BILIRUBINUR small 11/02/2018 1334   KETONESUR NEGATIVE 07/27/2023 1541  PROTEINUR 30 (A) 07/27/2023 1541   UROBILINOGEN 0.2 11/02/2018 1334   NITRITE NEGATIVE 07/27/2023 1541   LEUKOCYTESUR NEGATIVE 07/27/2023 1541   Sepsis Labs: Invalid input(s): PROCALCITONIN, LACTICIDVEN  Microbiology: Recent Results (from the past 240 hours)  Blood culture (routine x 2)     Status: None (Preliminary result)   Collection Time: 07/27/23  4:35 PM   Specimen: BLOOD LEFT ARM  Result Value Ref Range Status   Specimen Description BLOOD LEFT ARM  Final   Special Requests   Final    BOTTLES DRAWN AEROBIC AND ANAEROBIC Blood Culture adequate volume   Culture   Final    NO GROWTH < 24 HOURS Performed at [facility], [address] [place], [place] [address]    Report Status PENDING  Incomplete  Blood culture (routine x 2)     Status: None (Preliminary result)   Collection Time: 07/27/23  4:35 PM   Specimen: BLOOD RIGHT ARM  Result Value Ref Range Status   Specimen Description BLOOD  RIGHT ARM  Final   Special Requests   Final    BOTTLES DRAWN AEROBIC AND ANAEROBIC Blood Culture adequate volume   Culture   Final    NO GROWTH < 24 HOURS Performed at [facility], [address] [place], [place] [address]    Report Status PENDING  Incomplete    Radiology Studies: CT CHEST ABDOMEN PELVIS W CONTRAST Result Date: 07/27/2023 CLINICAL DATA:  Left-sided abdominal pain and chest pain. Active stage III lung cancer. EXAM: CT CHEST, ABDOMEN, AND PELVIS WITH CONTRAST TECHNIQUE: Multidetector CT imaging of the chest, abdomen and pelvis was performed following the standard protocol during bolus administration of intravenous contrast. RADIATION DOSE REDUCTION: This exam was performed according to the departmental dose-optimization program which includes automated exposure control, adjustment of the mA and/or kV according to patient size and/or use of iterative reconstruction technique. CONTRAST:  80mL OMNIPAQUE  IOHEXOL  300 MG/ML  SOLN COMPARISON:  CT of the chest 04/08/2023. PET CT 03/21/2023. FINDINGS: CT CHEST FINDINGS Cardiovascular: The heart is enlarged. Aorta is normal in size. There are atherosclerotic calcifications of the aorta and coronary arteries. There is no pericardial effusion. Mediastinum/Nodes: Enlarged subcarinal lymph node measures 16 mm short axis, unchanged. No new enlarged lymph nodes are identified. Visualized esophagus and thyroid  gland are within normal limits. Lungs/Pleura: Small left pleural effusion is new from prior. There is new airspace disease in the medial left lower lobe. Left upper lobe/perihilar mass is again seen measuring 6.7 x 4.5 cm on image 2/18 there is narrowing of the distal left mainstem bronchus and cutoff of left upper lobe bronchus as seen on the prior study. Airspace consolidation in the left upper lobe distal to the mass has increased. Left upper lobe pulmonary nodule measuring 6 mm image 4/41 appears new from prior. Scattered scarring in  the right lung is unchanged. Moderate emphysema is stable. Scarring in the right lung apex is stable. Musculoskeletal: There are numerous healed inferior left rib fractures which are new from prior. CT ABDOMEN PELVIS FINDINGS Hepatobiliary: Coarse calcifications in the right lobe of the liver are unchanged. No new liver lesions. Gallbladder and bile ducts are within normal limits. Pancreas: Cystic area in the tail the pancreas measures 12 mm and appears unchanged. Pancreas is otherwise within normal limits. Spleen: Status post splenectomy. Adrenals/Urinary Tract: Bilateral renal atrophy present with cortical scarring. There are numerous subcentimeter hypodensities in the kidneys which are too small to characterize. There is no hydronephrosis or perinephric  fluid. The adrenal glands and bladder are within normal limits. Stomach/Bowel: Stomach is within normal limits. Appendix is not seen. No evidence of bowel wall thickening, distention, or inflammatory changes. Vascular/Lymphatic: 3.1 cm abdominal aortic aneurysm is unchanged. There are atherosclerotic calcifications of the aorta and iliac arteries. Prominent upper abdominal lymph nodes are again seen similar to prior. Reproductive: Prostate is unremarkable. Other: There are small fat containing inguinal hernias. There is no ascites. Musculoskeletal: Compression fracture of the superior endplate of L3 is age indeterminate, possibly acute or subacute. The bones are diffusely osteopenic. Degenerative changes affect the spine. IMPRESSION: 1. New small left pleural effusion. 2. New airspace disease in the medial left lower lobe worrisome for pneumonia. 3. Grossly unchanged left upper lobe/perihilar mass measuring 6.7 x 4.5 cm. Stable narrowing of the distal left mainstem bronchus and cutoff of the left upper lobe. 4. Airspace consolidation in the left upper lobe distal to the mass has increased. 5. New 6 mm left upper lobe pulmonary nodule. 6. Stable enlarged subcarinal  lymph node. 7. Stable 3.1 cm abdominal aortic aneurysm. Recommend follow-up ultrasound every 3 years. 8. Stable 12 mm cystic area in the tail of the pancreas. Recommend follow-up MRI/MRCP in 2 years. 9. Compression fracture of the superior endplate of L3 is age indeterminate, possibly acute or subacute. 10. New subacute left lower rib fractures. Aortic Atherosclerosis (ICD10-I70.0) and Emphysema (ICD10-J43.9). Electronically Signed   By: Greig Pique M.D.   On: 07/27/2023 17:29    Scheduled Meds:  apixaban   2.5 mg Oral BID   ezetimibe   10 mg Oral Daily   insulin  aspart  0-9 Units Subcutaneous TID WC   levalbuterol  0.63 mg Nebulization TID   metoprolol  succinate  25 mg Oral Daily   rosuvastatin   10 mg Oral Daily   Continuous Infusions:  azithromycin     ceFEPime (MAXIPIME) IV Stopped (07/28/23 1113)     LOS: 1 day   35 minutes with more than 50% spent in reviewing records, counseling patient/family and coordinating care.  Reyes VEAR Gaw, MD [facility] [email or web address] 07/28/2023, 4:35 PM

## 2023-07-29 ENCOUNTER — Inpatient Hospital Stay (HOSPITAL_COMMUNITY)
Admit: 2023-07-29 | Discharge: 2023-07-29 | Disposition: A | Discharge status: Home / Self Care | Attending: Infectious Diseases | Admitting: Infectious Diseases

## 2023-07-29 DIAGNOSIS — A419 Sepsis, unspecified organism: Secondary | ICD-10-CM | POA: Diagnosis not present

## 2023-07-29 DIAGNOSIS — R7881 Bacteremia: Secondary | ICD-10-CM | POA: Diagnosis not present

## 2023-07-29 DIAGNOSIS — J189 Pneumonia, unspecified organism: Secondary | ICD-10-CM | POA: Diagnosis not present

## 2023-07-29 LAB — EXPECTORATED SPUTUM ASSESSMENT W GRAM STAIN, RFLX TO RESP C

## 2023-07-29 LAB — BLOOD CULTURE ID PANEL (REFLEXED) - BCID2

## 2023-07-29 LAB — COMPREHENSIVE METABOLIC PANEL WITH GFR
ALT: 13 U/L (ref 0–44)
AST: 17 U/L (ref 15–41)
Albumin: 2.4 g/dL — ABNORMAL LOW (ref 3.5–5.0)
Alkaline Phosphatase: 50 U/L (ref 38–126)
Anion gap: 11 (ref 5–15)
BUN: 36 mg/dL — ABNORMAL HIGH (ref 8–23)
CO2: 24 mmol/L (ref 22–32)
Calcium: 8.8 mg/dL — ABNORMAL LOW (ref 8.9–10.3)
Chloride: 97 mmol/L — ABNORMAL LOW (ref 98–111)
Creatinine, Ser: 1.76 mg/dL — ABNORMAL HIGH (ref 0.61–1.24)
GFR, Estimated: 39 mL/min — ABNORMAL LOW (ref >60)
Glucose, Bld: 189 mg/dL — ABNORMAL HIGH (ref 70–99)
Potassium: 3.6 mmol/L (ref 3.5–5.1)
Sodium: 132 mmol/L — ABNORMAL LOW (ref 135–145)
Total Bilirubin: 1.1 mg/dL (ref 0.0–1.2)
Total Protein: 6.6 g/dL (ref 6.5–8.1)

## 2023-07-29 LAB — CBC
HCT: 32.1 % — ABNORMAL LOW (ref 39.0–52.0)
Hemoglobin: 10.5 g/dL — ABNORMAL LOW (ref 13.0–17.0)
MCH: 29 pg (ref 26.0–34.0)
MCHC: 32.7 g/dL (ref 30.0–36.0)
MCV: 88.7 fL (ref 80.0–100.0)
Platelets: 307 10*3/uL (ref 150–400)
RBC: 3.62 MIL/uL — ABNORMAL LOW (ref 4.22–5.81)
RDW: 16.3 % — ABNORMAL HIGH (ref 11.5–15.5)
WBC: 35.1 10*3/uL — ABNORMAL HIGH (ref 4.0–10.5)
nRBC: 0.5 % — ABNORMAL HIGH (ref 0.0–0.2)

## 2023-07-29 LAB — GLUCOSE, CAPILLARY
Glucose-Capillary: 193 mg/dL — ABNORMAL HIGH (ref 70–99)
Glucose-Capillary: 197 mg/dL — ABNORMAL HIGH (ref 70–99)
Glucose-Capillary: 197 mg/dL — ABNORMAL HIGH (ref 70–99)
Glucose-Capillary: 188 mg/dL — ABNORMAL HIGH (ref 70–99)
Glucose-Capillary: 219 mg/dL — ABNORMAL HIGH (ref 70–99)

## 2023-07-29 MED ORDER — VANCOMYCIN HCL 750 MG/150ML IV SOLN
750.0000 mg | INTRAVENOUS | Status: DC
  Administered 2023-07-30: 750 mg via INTRAVENOUS
  Filled 2023-07-29: qty 150

## 2023-07-29 MED ORDER — VANCOMYCIN HCL 1750 MG/350ML IV SOLN
1750.0000 mg | Freq: Once | INTRAVENOUS | Status: AC
  Administered 2023-07-29: 1750 mg via INTRAVENOUS
  Filled 2023-07-29: qty 350

## 2023-07-29 MED ORDER — VANCOMYCIN HCL 1250 MG/250ML IV SOLN: 1250.0000 mg | INTRAVENOUS | Status: DC

## 2023-07-29 NOTE — Progress Notes (Signed)
 Pharmacy Antibiotic Note  Bobby Thomas is a 83 y.o. male admitted on 07/27/2023 with bacteremia.  Pharmacy has been consulted for Vancomycin dosing.  Plan: Pt given Vancomycin 1750 mg once. Vancomycin 1250 mg IV Q 36 hrs. Goal AUC 400-550. Expected AUC: 496.6 SCr used: 1.77  Pharmacy will continue to follow and will adjust abx dosing whenever warranted.  Temp (24hrs), Avg:98.2 F (36.8 C), Min:97.8 F (36.6 C), Max:98.6 F (37 C)   Recent Labs  Lab 07/27/23 0503 07/27/23 1541 07/27/23 1635 07/27/23 1832 07/28/23 0503  WBC  --  32.7*  --   --  40.9*  CREATININE  --  1.93*  --   --  1.77*  LATICACIDVEN 1.9  --  2.0* 2.3*  --     Estimated Creatinine Clearance: 31.1 mL/min (A) (by C-G formula based on SCr of 1.77 mg/dL (H)).    Allergies  Allergen Reactions   Hydrocodone-Guaifenesin Shortness Of Breath and Other (See Comments)    Not able to sit still when using Codiclear DH   Bee Venom Other (See Comments)   Codeine Other (See Comments)    Other reaction(s): Pruritus   Guaifenesin-Codeine Other (See Comments)   Morphine Other (See Comments)   Morphine Sulfate     Other reaction(s): Pruritus   Semaglutide  Other (See Comments)    Felt bad while taking Rybelsus  03/2023   Warfarin Sodium Diarrhea    Antimicrobials this admission: 6/22 Azithromycin >> x 5 doses 6/23 Cefepime >>  6/24 Vancomycin >>   Microbiology results: 6/22 BCx: 1 (aerobic) of 4 bottles with MRSA (mecA/C & MREJ detected  6/22 Sputum: Sent  6/23 MRSA PCR: not detected  Thank you for allowing pharmacy to be a part of this patient's care.  Rankin CANDIE Dills, PharmD, MBA 07/29/2023 1:22 AM

## 2023-07-29 NOTE — Progress Notes (Addendum)
 PROGRESS NOTE  Bobby Thomas  FMW:[id number] DOB: Aug 28, 1940 DOA: 07/27/2023 PCP: Gasper Nancyann BRAVO, MD  Consultants  Brief Narrative: 83 y.o. male with a PMH significant for atrial fibrillation, actinic keratosis, allergy, anxiety, history of small bowel obstruction, carotid artery disease, coronary artery disease, hypertension, hyperlipidemia, type 2 diabetes, history of CVA, aphasia, peripheral arterial disease, stage IIIb CKD, clotting disorder, stage IV lung cancer who presented to the emergency department with a left-sided abdominal pain since earlier today. He has been having sputum with occasional hemoptysis. He denied fever, chills, rhinorrhea, sore throat or wheezing. No chest pain, palpitations, diaphoresis, PND, orthopnea or pitting edema of the lower extremities. Positive left-sided abdominal pain, but no nausea, emesis, diarrhea, constipation, melena or hematochezia. No flank pain, dysuria, frequency or hematuria. No polyuria, polydipsia, polyphagia or blurred vision.    Assessment & Plan: Acute respiratory failure and severe sepsis secondary to PNA: - Tachypnea has improved.  Severe sepsis resolved.  - He and his wife who is at bedside both say that he is feeling much better today than he was since being broadened to cefepime. - Initially on CTX/azithro for CAP-->broadened to cefepime in light of immunocompromised status secondary to lung CA - Remains on oxygen.  Would like to get out of bed to walk around and keep his strength up. - Scheduled and as needed bronchodilators which he feels are helpful . - 1 blood cx now positive for MRSA, see below - Continue incentive spirometer  MRSA bacteremia: - noted on blood culture today - ID has already been consulted based on inpt protocol - previously on Cefepime.  Vanc now added.  Would explain persistently elevated WBC below.  - Await ID rec's regarding echo, further imaging, etc.   Rib fractures: - fall at home onto chair at  beginning of June - ongoing pain since then - CT revealed sub-acute rib fx's consistent with fall.   - has made it harder to take deep breaths which likely contributed to atelectasis and likely PNA as well.  - continue pain control.  He's limited by allergies as to what he can take.  - Rib binder for support.   Leukocytosis: - grossly elevated.  40.9K up from 32.7K on admission, down today to 35.1 - Last WBC we have back in March was 14.3 - likely secondary to MRSA bacteremia above  Normocytic anemia: - Has known lung cancer. - Hemoglobin has been dropping since admission 12.2--> 11.1--> 10.5.  He has not noted any bleeding.  Imaging studies negative for blood.  Does have known hemoptysis secondary to lung cancer which is chronic for him.  See below. - Continue to trend.  If hemoptysis and anemia continue to worsen would consult pulmonology    Hemoptysis In the setting of lung cancer and apixaban  use.  Mild since admission Reports this as chronic and has been documented going back to March at least of this year.  - Trend hemoglobin - He is on apixaban  for atrial fibrillation.  If hemoptysis continues and hemoglobin drops again would hold  Stage III lung cancer; - [facility]  - has declined therapy   CKD Stage III: - Creatinine at his baseline.   - will continue to trend.    Hyponatremia: - likely hypotonic. - mild, asx.   - will trend.  Continue oral hydration.   Hyperbilirubinemia: - notably elevated on admission but now resolved     HLD (hyperlipidemia) Continue ezetimibe  10 mg p.o. daily. Continue rosuvastatin  10 mg  p.o. daily.     Essential hypertension Hold furosemide. Resume metoprolol  succinate 25 mg p.o. in the morning. BP remains acceptable here.      Intermittent atrial fibrillation (HCC) CHA?DS?-VASc Score of 8. Continue apixaban  2.5 mg p.o. twice daily--> trend hemoglobin Continue metoprolol  succinate 25 mg p.o. daily.     Type 2  diabetes mellitus with diabetic chronic kidney disease (HCC) Carbohydrate modified diet. - Stopping home hypoglycemics and placed on insulin  here - >500 glucose in urine.  - A1C > 9.8  DVT prophylaxis:  apixaban  (ELIQUIS ) tablet 2.5 mg Start: 07/27/23 2200 apixaban  (ELIQUIS ) tablet 2.5 mg  Code Status:   Code Status: Limited: Do not attempt resuscitation (DNR) -DNR-LIMITED -Do Not Intubate/DNI  Family Communication: Wife at bedside, all questions answered Level of care: Progressive Status is: Inpatient Dispo: Likely in house for at least 24 hours IV abx and pending improvement in sx's/leukocytosis.   Consults called: none   Subjective: Feels better today than he has in some time.  Still with some left-sided chest pain when coughing or taking deep breath.  Using his incentive spirometer regularly.  Otherwise denies complaints.   Objective: Vitals:   07/29/23 0731 07/29/23 0743 07/29/23 1157 07/29/23 1432  BP:  (!) 134/92 115/61   Pulse:  93 98   Resp:      Temp:  97.7 F (36.5 C) 98.1 F (36.7 C)   TempSrc:      SpO2: 92% 98% 92% 95%  Weight:      Height:        Intake/Output Summary (Last 24 hours) at 07/29/2023 1549 Last data filed at 07/29/2023 1543 Gross per 24 hour  Intake 580 ml  Output 1750 ml  Net -1170 ml   Filed Weights   07/27/23 1555  Weight: 77.1 kg   Body mass index is 26.63 kg/m.  Gen: 83 y.o. male in no apparent distress.  Nontoxic, sitting up in bed using incentive spirometer throughout examination. Pulm: Non-labored breathing.  No crackles CV: Regular rate and rhythm. Chest:  TTP Left lower ribs with palpation  GI: Abdomen soft, non-tender, non-distended Ext: Warm, no deformities, Skin: No rashes, lesions  Neuro: Alert and oriented. No focal neurological deficits. Psych: Calm  Judgement and insight appear normal. Mood & affect appropriate.     I have personally reviewed the following labs and images: CBC: Recent Labs  Lab 07/27/23 1541  07/28/23 0503 07/29/23 0520  WBC 32.7* 40.9* 35.1*  HGB 12.2* 11.1* 10.5*  HCT 36.2* 32.9* 32.1*  MCV 87.4 88.0 88.7  PLT 351 319 307   BMP &GFR Recent Labs  Lab 07/27/23 1541 07/28/23 0503 07/29/23 0520  NA 132* 134* 132*  K 2.7* 3.5 3.6  CL 90* 99 97*  CO2 29 24 24   GLUCOSE 265* 190* 189*  BUN 36* 32* 36*  CREATININE 1.93* 1.77* 1.76*  CALCIUM  8.8* 8.3* 8.8*  MG 1.9  --   --    Estimated Creatinine Clearance: 31.3 mL/min (A) (by C-G formula based on SCr of 1.76 mg/dL (H)). Liver & Pancreas: Recent Labs  Lab 07/27/23 1541 07/28/23 0503 07/29/23 0520  AST 19 17 17   ALT 13 12 13   ALKPHOS 46 41 50  BILITOT 1.8* 2.1* 1.1  PROT 7.9 6.7 6.6  ALBUMIN  3.3* 2.6* 2.4*   Recent Labs  Lab 07/27/23 1541  LIPASE 27   No results for input(s): AMMONIA in the last 168 hours. Diabetic: No results for input(s): HGBA1C in the last 72 hours. Recent  Labs  Lab 07/28/23 1134 07/28/23 1607 07/29/23 0129 07/29/23 0744 07/29/23 1155  GLUCAP 203* 213* 193* 197* 197*   Cardiac Enzymes: No results for input(s): CKTOTAL, CKMB, CKMBINDEX, TROPONINI in the last 168 hours. No results for input(s): PROBNP in the last 8760 hours. Coagulation Profile: No results for input(s): INR, PROTIME in the last 168 hours. Thyroid  Function Tests: No results for input(s): TSH, T4TOTAL, FREET4, T3FREE, THYROIDAB in the last 72 hours. Lipid Profile: No results for input(s): CHOL, HDL, LDLCALC, TRIG, CHOLHDL, LDLDIRECT in the last 72 hours. Anemia Panel: No results for input(s): VITAMINB12, FOLATE, FERRITIN, TIBC, IRON, RETICCTPCT in the last 72 hours. Urine analysis:    Component Value Date/Time   COLORURINE YELLOW (A) 07/27/2023 1541   APPEARANCEUR CLEAR (A) 07/27/2023 1541   LABSPEC 1.014 07/27/2023 1541   PHURINE 5.0 07/27/2023 1541   GLUCOSEU >=500 (A) 07/27/2023 1541   HGBUR SMALL (A) 07/27/2023 1541   BILIRUBINUR NEGATIVE 07/27/2023  1541   BILIRUBINUR small 11/02/2018 1334   KETONESUR NEGATIVE 07/27/2023 1541   PROTEINUR 30 (A) 07/27/2023 1541   UROBILINOGEN 0.2 11/02/2018 1334   NITRITE NEGATIVE 07/27/2023 1541   LEUKOCYTESUR NEGATIVE 07/27/2023 1541   Sepsis Labs: Invalid input(s): PROCALCITONIN, LACTICIDVEN  Microbiology: Recent Results (from the past 240 hours)  Blood culture (routine x 2)     Status: None (Preliminary result)   Collection Time: 07/27/23  4:35 PM   Specimen: BLOOD LEFT ARM  Result Value Ref Range Status   Specimen Description BLOOD LEFT ARM  Final   Special Requests   Final    BOTTLES DRAWN AEROBIC AND ANAEROBIC Blood Culture adequate volume   Culture  Setup Time   Final    GRAM POSITIVE COCCI AEROBIC BOTTLE ONLY CRITICAL VALUE NOTED.  VALUE IS CONSISTENT WITH PREVIOUSLY REPORTED AND CALLED VALUE.    Culture   Final    NO GROWTH 2 DAYS Performed at [facility], [address] [place], [place] [address]    Report Status PENDING  Incomplete  Blood culture (routine x 2)     Status: None (Preliminary result)   Collection Time: 07/27/23  4:35 PM   Specimen: BLOOD RIGHT ARM  Result Value Ref Range Status   Specimen Description   Final    BLOOD RIGHT ARM Performed at [facility], [address] [place], [place] [address]    Special Requests   Final    BOTTLES DRAWN AEROBIC AND ANAEROBIC Blood Culture adequate volume Performed at [facility], [address] [place], [place] [address]    Culture  Setup Time   Final    GRAM POSITIVE COCCI AEROBIC BOTTLE ONLY CRITICAL RESULT CALLED TO, READ BACK BY AND VERIFIED WITH:  NATHAN BELUE AT 9968 07/29/23 JG Performed at [facility], [address] [place], [place] [address]    Culture GRAM POSITIVE COCCI  Final   Report Status PENDING  Incomplete  Blood Culture ID Panel (Reflexed)     Status: Abnormal   Collection Time: 07/27/23  4:35 PM  Result Value Ref Range Status   Enterococcus faecalis  NOT DETECTED NOT DETECTED Final   Enterococcus Faecium NOT DETECTED NOT DETECTED Final   Listeria monocytogenes NOT DETECTED NOT DETECTED Final   Staphylococcus species DETECTED (A) NOT DETECTED Corrected    Comment: CRITICAL RESULT CALLED TO, READ BACK BY AND VERIFIED WITH:  NATHAN BELUE AT 0031 07/29/23 JG CORRECTED ON 06/24 AT 9297: PREVIOUSLY REPORTED AS DETECTED  NATHAN BELUE AT  0031 07/29/23 JG    Staphylococcus aureus (BCID) DETECTED (A) NOT DETECTED Final    Comment: Methicillin (oxacillin)-resistant Staphylococcus aureus (MRSA). MRSA is predictably resistant to beta-lactam antibiotics (except ceftaroline). Preferred therapy is vancomycin unless clinically contraindicated. Patient requires contact precautions if  hospitalized. CRITICAL RESULT CALLED TO, READ BACK BY AND VERIFIED WITH:  NATHAN BELUE AT 0031 07/29/23 JG    Staphylococcus epidermidis NOT DETECTED NOT DETECTED Final   Staphylococcus lugdunensis NOT DETECTED NOT DETECTED Final   Streptococcus species NOT DETECTED NOT DETECTED Final   Streptococcus agalactiae NOT DETECTED NOT DETECTED Final   Streptococcus pneumoniae NOT DETECTED NOT DETECTED Final   Streptococcus pyogenes NOT DETECTED NOT DETECTED Final   A.calcoaceticus-baumannii NOT DETECTED NOT DETECTED Final   Bacteroides fragilis NOT DETECTED NOT DETECTED Final   Enterobacterales NOT DETECTED NOT DETECTED Final   Enterobacter cloacae complex NOT DETECTED NOT DETECTED Final   Escherichia coli NOT DETECTED NOT DETECTED Final   Klebsiella aerogenes NOT DETECTED NOT DETECTED Final   Klebsiella oxytoca NOT DETECTED NOT DETECTED Final   Klebsiella pneumoniae NOT DETECTED NOT DETECTED Final   Proteus species NOT DETECTED NOT DETECTED Final   Salmonella species NOT DETECTED NOT DETECTED Final   Serratia marcescens NOT DETECTED NOT DETECTED Final   Haemophilus influenzae NOT DETECTED NOT DETECTED Final   Neisseria meningitidis NOT DETECTED NOT DETECTED Final    Pseudomonas aeruginosa NOT DETECTED NOT DETECTED Final   Stenotrophomonas maltophilia NOT DETECTED NOT DETECTED Final   Candida albicans NOT DETECTED NOT DETECTED Final   Candida auris NOT DETECTED NOT DETECTED Final   Candida glabrata NOT DETECTED NOT DETECTED Final   Candida krusei NOT DETECTED NOT DETECTED Final   Candida parapsilosis NOT DETECTED NOT DETECTED Final   Candida tropicalis NOT DETECTED NOT DETECTED Final   Cryptococcus neoformans/gattii NOT DETECTED NOT DETECTED Final   Meth resistant mecA/C and MREJ DETECTED (A) NOT DETECTED Final    Comment: CRITICAL RESULT CALLED TO, READ BACK BY AND VERIFIED WITH:  NATHAN BELUE AT 0031 07/29/23 JG Performed at [facility], [address] [place], [place] [address]   Expectorated Sputum Assessment w Gram Stain, Rflx to Resp Cult     Status: None   Collection Time: 07/28/23  1:00 PM   Specimen: Sputum  Result Value Ref Range Status   Specimen Description SPUTUM  Final   Special Requests NONE  Final   Sputum evaluation   Final    THIS SPECIMEN IS ACCEPTABLE FOR SPUTUM CULTURE Performed at [facility], [address] [place], [place] [address]    Report Status 07/29/2023 FINAL  Final  MRSA Next Gen by PCR, Nasal     Status: None   Collection Time: 07/28/23  4:36 PM   Specimen: Nasal Mucosa; Nasal Swab  Result Value Ref Range Status   MRSA by PCR Next Gen NOT DETECTED NOT DETECTED Final    Comment: (NOTE) The GeneXpert MRSA Assay (FDA approved for NASAL specimens only), is one component of a comprehensive MRSA colonization surveillance program. It is not intended to diagnose MRSA infection nor to guide or monitor treatment for MRSA infections. Test performance is not FDA approved in patients less than 81 years old. Performed at [facility], [address], [place], [place] [address]     Radiology Studies: No results found.   Scheduled Meds:  apixaban   2.5 mg Oral BID   ezetimibe   10 mg  Oral Daily   insulin  aspart  0-9 Units Subcutaneous TID  WC   levalbuterol  0.63 mg Nebulization TID   metoprolol  succinate  25 mg Oral Daily   rosuvastatin   10 mg Oral Daily   Continuous Infusions:  azithromycin 500 mg (07/28/23 1800)   ceFEPime (MAXIPIME) IV 2 g (07/29/23 0835)   [START ON 07/30/2023] vancomycin       LOS: 2 days   35 minutes with more than 50% spent in reviewing records, counseling patient/family and coordinating care.  Bobby VEAR Gaw, MD [facility] [email or web address] 07/29/2023, 3:49 PM

## 2023-07-29 NOTE — Plan of Care (Signed)
  Problem: Coping: Goal: Ability to adjust to condition or change in health will improve Outcome: Progressing   Problem: Skin Integrity: Goal: Risk for impaired skin integrity will decrease Outcome: Progressing   Problem: Clinical Measurements: Goal: Respiratory complications will improve Outcome: Progressing   Problem: Activity: Goal: Risk for activity intolerance will decrease Outcome: Progressing   Problem: Nutrition: Goal: Adequate nutrition will be maintained Outcome: Progressing

## 2023-07-29 NOTE — Consult Note (Signed)
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 PM  Result Value Ref Range Status   Enterococcus faecalis NOT DETECTED NOT DETECTED Final   Enterococcus Faecium NOT DETECTED NOT DETECTED Final   Listeria monocytogenes NOT DETECTED NOT DETECTED Final   Staphylococcus species DETECTED (A) NOT DETECTED Corrected    Comment: CRITICAL RESULT CALLED TO, READ BACK BY AND VERIFIED WITH:  NATHAN BELUE AT 0031 07/29/23 JG CORRECTED ON 06/24 AT  9297: PREVIOUSLY  REPORTED AS DETECTED  NATHAN BELUE AT 0031 07/29/23 JG    Staphylococcus aureus (BCID) DETECTED (A) NOT DETECTED Final    Comment: Methicillin (oxacillin)-resistant Staphylococcus aureus (MRSA). MRSA is predictably resistant to beta-lactam antibiotics (except ceftaroline). Preferred therapy is vancomycin unless clinically contraindicated. Patient requires contact precautions if  hospitalized. CRITICAL RESULT CALLED TO, READ BACK BY AND VERIFIED WITH:  NATHAN BELUE AT 0031 07/29/23 JG    Staphylococcus epidermidis NOT DETECTED NOT DETECTED Final   Staphylococcus lugdunensis NOT DETECTED NOT DETECTED Final   Streptococcus species NOT DETECTED NOT DETECTED Final   Streptococcus agalactiae NOT DETECTED NOT DETECTED Final   Streptococcus pneumoniae NOT DETECTED NOT DETECTED Final   Streptococcus pyogenes NOT DETECTED NOT DETECTED Final   A.calcoaceticus-baumannii NOT DETECTED NOT DETECTED Final   Bacteroides fragilis NOT DETECTED NOT DETECTED Final   Enterobacterales NOT DETECTED NOT DETECTED Final   Enterobacter cloacae complex NOT DETECTED NOT DETECTED Final   Escherichia coli NOT DETECTED NOT DETECTED Final   Klebsiella aerogenes NOT DETECTED NOT DETECTED Final   Klebsiella oxytoca NOT DETECTED NOT DETECTED Final   Klebsiella pneumoniae NOT DETECTED NOT DETECTED Final   Proteus species NOT DETECTED NOT DETECTED Final   Salmonella species NOT DETECTED NOT DETECTED Final   Serratia marcescens NOT DETECTED NOT DETECTED Final   Haemophilus influenzae NOT DETECTED NOT DETECTED Final   Neisseria meningitidis NOT DETECTED NOT DETECTED Final   Pseudomonas aeruginosa NOT DETECTED NOT DETECTED Final   Stenotrophomonas maltophilia NOT DETECTED NOT DETECTED Final   Candida albicans NOT DETECTED NOT DETECTED Final   Candida auris NOT DETECTED NOT DETECTED Final   Candida glabrata NOT DETECTED NOT DETECTED Final   Candida krusei NOT DETECTED NOT DETECTED Final   Candida parapsilosis NOT DETECTED NOT  DETECTED Final   Candida tropicalis NOT DETECTED NOT DETECTED Final   Cryptococcus neoformans/gattii NOT DETECTED NOT DETECTED Final   Meth resistant mecA/C and MREJ DETECTED (A) NOT DETECTED Final    Comment: CRITICAL RESULT CALLED TO, READ BACK BY AND VERIFIED WITHBETHA RANKIN DILLS AT 0031 07/29/23 JG Performed at [facility], [address] [place], [place] [address]   MRSA Next Gen by PCR, Nasal     Status: None   Collection Time: 07/28/23  4:36 PM   Specimen: Nasal Mucosa; Nasal Swab  Result Value Ref Range Status   MRSA by PCR Next Gen NOT DETECTED NOT DETECTED Final    Comment: (NOTE) The GeneXpert MRSA Assay (FDA approved for NASAL specimens only), is one component of a comprehensive MRSA colonization surveillance program. It is not intended to diagnose MRSA infection nor to guide or monitor treatment for MRSA infections. Test performance is not FDA approved in patients less than 20 years old. Performed at [facility], [address] [place], [place] [address]     IMAGING: CT CHEST ABDOMEN PELVIS W CONTRAST Result Date: 07/27/2023 CLINICAL DATA:  Left-sided abdominal pain and chest pain. Active stage III lung cancer. EXAM: CT CHEST, ABDOMEN, AND PELVIS WITH CONTRAST TECHNIQUE: Multidetector CT imaging of the chest, abdomen and pelvis was performed following the standard protocol during bolus administration of intravenous contrast. RADIATION DOSE REDUCTION: This exam was performed according to the departmental dose-optimization program which includes automated exposure control, adjustment of the mA and/or kV according to patient size and/or use of iterative reconstruction technique. CONTRAST:  80mL OMNIPAQUE  IOHEXOL  300 MG/ML  SOLN COMPARISON:  CT of the chest 04/08/2023. PET CT 03/21/2023. FINDINGS: CT CHEST FINDINGS  Cardiovascular: The heart is enlarged. Aorta is normal in size. There are atherosclerotic calcifications of the aorta and coronary arteries. There  is no pericardial effusion. Mediastinum/Nodes: Enlarged subcarinal lymph node measures 16 mm short axis, unchanged. No new enlarged lymph nodes are identified. Visualized esophagus and thyroid  gland are within normal limits. Lungs/Pleura: Small left pleural effusion is new from prior. There is new airspace disease in the medial left lower lobe. Left upper lobe/perihilar mass is again seen measuring 6.7 x 4.5 cm on image 2/18 there is narrowing of the distal left mainstem bronchus and cutoff of left upper lobe bronchus as seen on the prior study. Airspace consolidation in the left upper lobe distal to the mass has increased. Left upper lobe pulmonary nodule measuring 6 mm image 4/41 appears new from prior. Scattered scarring in the right lung is unchanged. Moderate emphysema is stable. Scarring in the right lung apex is stable. Musculoskeletal: There are numerous healed inferior left rib fractures which are new from prior. CT ABDOMEN PELVIS FINDINGS Hepatobiliary: Coarse calcifications in the right lobe of the liver are unchanged. No new liver lesions. Gallbladder and bile ducts are within normal limits. Pancreas: Cystic area in the tail the pancreas measures 12 mm and appears unchanged. Pancreas is otherwise within normal limits. Spleen: Status post splenectomy. Adrenals/Urinary Tract: Bilateral renal atrophy present with cortical scarring. There are numerous subcentimeter hypodensities in the kidneys which are too small to characterize. There is no hydronephrosis or perinephric fluid. The adrenal glands and bladder are within normal limits. Stomach/Bowel: Stomach is within normal limits. Appendix is not seen. No evidence of bowel wall thickening, distention, or inflammatory changes. Vascular/Lymphatic: 3.1 cm abdominal aortic aneurysm is unchanged. There are atherosclerotic calcifications of the aorta and iliac arteries. Prominent upper abdominal lymph nodes are again seen similar to prior. Reproductive: Prostate  is unremarkable. Other: There are small fat containing inguinal hernias. There is no ascites. Musculoskeletal: Compression fracture of the superior endplate of L3 is age indeterminate, possibly acute or subacute. The bones are diffusely osteopenic. Degenerative changes affect the spine. IMPRESSION: 1. New small left pleural effusion. 2. New airspace disease in the medial left lower lobe worrisome for pneumonia. 3. Grossly unchanged left upper lobe/perihilar mass measuring 6.7 x 4.5 cm. Stable narrowing of the distal left mainstem bronchus and cutoff of the left upper lobe. 4. Airspace consolidation in the left upper lobe distal to the mass has increased. 5. New 6 mm left upper lobe pulmonary nodule. 6. Stable enlarged subcarinal lymph node. 7. Stable 3.1 cm abdominal aortic aneurysm. Recommend follow-up ultrasound every 3 years. 8. Stable 12 mm cystic area in the tail of the pancreas. Recommend follow-up MRI/MRCP in 2 years. 9. Compression fracture of the superior endplate of L3 is age indeterminate, possibly acute or subacute. 10. New subacute left lower rib fractures. Aortic Atherosclerosis (ICD10-I70.0) and Emphysema (ICD10-J43.9). Electronically Signed   By: Greig Pique M.D.   On: 07/27/2023 17:29    Assessment:   Bobby Thomas is a 83 y.o. male with stage 4 Lung cancer , not undergoing treatment due to patient choice, admitted with abd pain and hemoptysis. Found to have MRSA bacteremia, L rib fractures from a fall and vert fracture as well as worsening airspace disease  Recommendations MRSA bacteremia- likely source is skin lesions and abrasions from fall. No current signs metastatic infection. No prosthetic hardware, ports or pacemakers Repeat bcx ordered Check ECHO  Cont vanco   PNA sputum cx pending- cont cefepime and azithro.  Thank you very much for allowing me to participate in the care of this patient. Please call with questions.   Alm SQUIBB. Epifanio, MD

## 2023-07-29 NOTE — Progress Notes (Signed)
 PHARMACY - PHYSICIAN COMMUNICATION CRITICAL VALUE ALERT - BLOOD CULTURE IDENTIFICATION (BCID)  Results for orders placed or performed during the hospital encounter of 07/27/23  Blood culture (routine x 2)     Status: None (Preliminary result)   Collection Time: 07/27/23  4:35 PM   Specimen: BLOOD LEFT ARM  Result Value Ref Range Status   Specimen Description BLOOD LEFT ARM  Final   Special Requests   Final    BOTTLES DRAWN AEROBIC AND ANAEROBIC Blood Culture adequate volume   Culture   Final    NO GROWTH < 24 HOURS Performed at [facility], [address] [place], [place] [address]    Report Status PENDING  Incomplete  Blood culture (routine x 2)     Status: None (Preliminary result)   Collection Time: 07/27/23  4:35 PM   Specimen: BLOOD RIGHT ARM  Result Value Ref Range Status   Specimen Description BLOOD RIGHT ARM  Final   Special Requests   Final    BOTTLES DRAWN AEROBIC AND ANAEROBIC Blood Culture adequate volume   Culture  Setup Time   Final    GRAM POSITIVE COCCI AEROBIC BOTTLE ONLY Organism ID to follow CRITICAL RESULT CALLED TO, READ BACK BY AND VERIFIED WITH:  Tajee Savant AT 0031 07/29/23 JG Performed at [facility], [address] [place], [place] [address]    Culture PENDING  Incomplete   Report Status PENDING  Incomplete  Blood Culture ID Panel (Reflexed)     Status: Abnormal   Collection Time: 07/27/23  4:35 PM  Result Value Ref Range Status   Enterococcus faecalis NOT DETECTED NOT DETECTED Final   Enterococcus Faecium NOT DETECTED NOT DETECTED Final   Listeria monocytogenes NOT DETECTED NOT DETECTED Final   Staphylococcus species DETECTED (A) NOT DETECTED Final    Comment:  Nickalas Mccarrick AT 0031 07/29/23 JG   Staphylococcus aureus (BCID) DETECTED (A) NOT DETECTED Final    Comment: Methicillin (oxacillin)-resistant Staphylococcus aureus (MRSA). MRSA is predictably resistant to beta-lactam antibiotics (except ceftaroline). Preferred therapy  is vancomycin unless clinically contraindicated. Patient requires contact precautions if  hospitalized. CRITICAL RESULT CALLED TO, READ BACK BY AND VERIFIED WITH:  Darienne Belleau AT 0031 07/29/23 JG    Staphylococcus epidermidis NOT DETECTED NOT DETECTED Final   Staphylococcus lugdunensis NOT DETECTED NOT DETECTED Final   Streptococcus species NOT DETECTED NOT DETECTED Final   Streptococcus agalactiae NOT DETECTED NOT DETECTED Final   Streptococcus pneumoniae NOT DETECTED NOT DETECTED Final   Streptococcus pyogenes NOT DETECTED NOT DETECTED Final   A.calcoaceticus-baumannii NOT DETECTED NOT DETECTED Final   Bacteroides fragilis NOT DETECTED NOT DETECTED Final   Enterobacterales NOT DETECTED NOT DETECTED Final   Enterobacter cloacae complex NOT DETECTED NOT DETECTED Final   Escherichia coli NOT DETECTED NOT DETECTED Final   Klebsiella aerogenes NOT DETECTED NOT DETECTED Final   Klebsiella oxytoca NOT DETECTED NOT DETECTED Final   Klebsiella pneumoniae NOT DETECTED NOT DETECTED Final   Proteus species NOT DETECTED NOT DETECTED Final   Salmonella species NOT DETECTED NOT DETECTED Final   Serratia marcescens NOT DETECTED NOT DETECTED Final   Haemophilus influenzae NOT DETECTED NOT DETECTED Final   Neisseria meningitidis NOT DETECTED NOT DETECTED Final   Pseudomonas aeruginosa NOT DETECTED NOT DETECTED Final   Stenotrophomonas maltophilia NOT DETECTED NOT DETECTED Final   Candida albicans NOT DETECTED NOT DETECTED Final   Candida auris NOT DETECTED NOT DETECTED Final   Candida glabrata NOT DETECTED NOT DETECTED Final   Candida  krusei NOT DETECTED NOT DETECTED Final   Candida parapsilosis NOT DETECTED NOT DETECTED Final   Candida tropicalis NOT DETECTED NOT DETECTED Final   Cryptococcus neoformans/gattii NOT DETECTED NOT DETECTED Final   Meth resistant mecA/C and MREJ DETECTED (A) NOT DETECTED Final    Comment: CRITICAL RESULT CALLED TO, READ BACK BY AND VERIFIED WITH:  RANKIN DILLS AT 0031  07/29/23 JG Performed at [facility], [address] [place], [place] [address]   MRSA Next Gen by PCR, Nasal     Status: None   Collection Time: 07/28/23  4:36 PM   Specimen: Nasal Mucosa; Nasal Swab  Result Value Ref Range Status   MRSA by PCR Next Gen NOT DETECTED NOT DETECTED Final    Comment: (NOTE) The GeneXpert MRSA Assay (FDA approved for NASAL specimens only), is one component of a comprehensive MRSA colonization surveillance program. It is not intended to diagnose MRSA infection nor to guide or monitor treatment for MRSA infections. Test performance is not FDA approved in patients less than 5 years old. Performed at [facility], [address] [place], [place] [address]     BCID Results: 1 (aerobic) of 4 bottles with MRSA (mecA/C & MREJ detected).  Pt currently on Azithromycin and Cefepime.  Name of provider contacted: HILARIO Solian, MD   Changes to prescribed antibiotics required: Add Vancomycin.  RANKIN CANDIE DILLS, PharmD, [facility] 07/29/2023 12:57 AM

## 2023-07-29 NOTE — Care Management Important Message (Signed)
 Important Message  Patient Details  Name: Bobby Thomas MRN: [id number] Date of Birth: 10/14/1940   Important Message Given:  Yes - Medicare IM     Rojelio [name] 07/29/2023, 12:15 PM

## 2023-07-29 NOTE — Progress Notes (Signed)
 Pharmacy Antibiotic Note  Bobby Thomas is a 83 y.o. male admitted on 07/27/2023 with MRSA bacteremia.  Patient with PMH advanced lung cancer (per note he declined chemotherapy). He presented with abdominal pain.  CT chest/abdomen/pelvis showed small pleural effusion, possible pneumonia, increased lung mass, stable AAA, subacute rib fractures, cystic area of pancreas. Pharmacy has been consulted for Vancomycin dosing.  Today, 07/29/2023 Day # 1 vancomycin (Day #3 Cefepime and azithromycin) Renal: CKD - S Cr 1.76 (looks to be at or perhaps below baseline) WBC 35.1 Afebrile 6/22 blood cx: 1 of 4 bottles GPC, BCID = MRSA No central lines, cardiac devices, or valves  Plan: Adjust vancomycin dose to 750mg  IV q24h Estimated AUC with above dose is 441 with Goal AUC 400-600. SCr used: 1.76 Estimated trough = 13 mcg/mL Follow renal function closely Check levels at steady-state F/u ability to stop concomitant antibiotics Await ID evaluation (automatic ID consult for MRSA bacteremia) - f/u repeat blood cx, TTE (? TEE).    Temp (24hrs), Avg:98.1 F (36.7 C), Min:97.7 F (36.5 C), Max:98.6 F (37 C)   Recent Labs  Lab 07/27/23 0503 07/27/23 1541 07/27/23 1635 07/27/23 1832 07/28/23 0503 07/29/23 0520  WBC  --  32.7*  --   --  40.9* 35.1*  CREATININE  --  1.93*  --   --  1.77* 1.76*  LATICACIDVEN 1.9  --  2.0* 2.3*  --   --     Estimated Creatinine Clearance: 31.3 mL/min (A) (by C-G formula based on SCr of 1.76 mg/dL (H)).    Allergies  Allergen Reactions   Hydrocodone-Guaifenesin Shortness Of Breath and Other (See Comments)    Not able to sit still when using Codiclear DH   Bee Venom Other (See Comments)   Codeine Other (See Comments)    Other reaction(s): Pruritus   Guaifenesin-Codeine Other (See Comments)   Morphine Other (See Comments)   Morphine Sulfate     Other reaction(s): Pruritus   Semaglutide  Other (See Comments)    Felt bad while taking Rybelsus  03/2023   Warfarin  Sodium Diarrhea    Antimicrobials this admission: 6/22 Azithromycin >> x 5 doses 6/23 Cefepime >>  6/24 Vancomycin >>   Microbiology results: 6/22 BCx: 1 (aerobic) of 4 bottles with MRSA (mecA/C & MREJ detected  6/22 Sputum: not collected 6/23 MRSA PCR: not detected  Thank you for allowing pharmacy to be a part of this patient's care.  Jenifer Struve, PharmD, BCPS, BCIDP Work Cell: [phone] 07/29/2023 8:59 AM

## 2023-07-30 ENCOUNTER — Other Ambulatory Visit: Payer: Self-pay

## 2023-07-30 DIAGNOSIS — E871 Hypo-osmolality and hyponatremia: Secondary | ICD-10-CM | POA: Insufficient documentation

## 2023-07-30 DIAGNOSIS — A419 Sepsis, unspecified organism: Secondary | ICD-10-CM | POA: Diagnosis not present

## 2023-07-30 DIAGNOSIS — R042 Hemoptysis: Secondary | ICD-10-CM | POA: Diagnosis not present

## 2023-07-30 DIAGNOSIS — C3492 Malignant neoplasm of unspecified part of left bronchus or lung: Secondary | ICD-10-CM | POA: Diagnosis not present

## 2023-07-30 DIAGNOSIS — J189 Pneumonia, unspecified organism: Secondary | ICD-10-CM | POA: Diagnosis not present

## 2023-07-30 LAB — CBC
HCT: 31.4 % — ABNORMAL LOW (ref 39.0–52.0)
Hemoglobin: 10.4 g/dL — ABNORMAL LOW (ref 13.0–17.0)
MCH: 28.9 pg (ref 26.0–34.0)
MCHC: 33.1 g/dL (ref 30.0–36.0)
MCV: 87.2 fL (ref 80.0–100.0)
Platelets: 331 10*3/uL (ref 150–400)
RBC: 3.6 MIL/uL — ABNORMAL LOW (ref 4.22–5.81)
RDW: 16.1 % — ABNORMAL HIGH (ref 11.5–15.5)
WBC: 30.5 10*3/uL — ABNORMAL HIGH (ref 4.0–10.5)
nRBC: 0.9 % — ABNORMAL HIGH (ref 0.0–0.2)

## 2023-07-30 LAB — BASIC METABOLIC PANEL WITH GFR
Anion gap: 13 (ref 5–15)
BUN: 51 mg/dL — ABNORMAL HIGH (ref 8–23)
CO2: 20 mmol/L — ABNORMAL LOW (ref 22–32)
Calcium: 9 mg/dL (ref 8.9–10.3)
Chloride: 96 mmol/L — ABNORMAL LOW (ref 98–111)
Creatinine, Ser: 2.15 mg/dL — ABNORMAL HIGH (ref 0.61–1.24)
GFR, Estimated: 30 mL/min — ABNORMAL LOW (ref >60)
Glucose, Bld: 175 mg/dL — ABNORMAL HIGH (ref 70–99)
Potassium: 3.7 mmol/L (ref 3.5–5.1)
Sodium: 129 mmol/L — ABNORMAL LOW (ref 135–145)

## 2023-07-30 LAB — ECHOCARDIOGRAM COMPLETE
Height: 67 in
P 1/2 time: 269 ms
S' Lateral: 2.7 cm
Weight: 2720 [oz_av]

## 2023-07-30 LAB — GLUCOSE, CAPILLARY: Glucose-Capillary: 166 mg/dL — ABNORMAL HIGH (ref 70–99)

## 2023-07-30 MED ORDER — COMBIVENT RESPIMAT 20-100 MCG/ACT IN AERS
1.0000 | INHALATION_SPRAY | Freq: Four times a day (QID) | RESPIRATORY_TRACT | 0 refills | Status: DC
  Filled 2023-07-30: qty 4, 30d supply, fill #0

## 2023-07-30 MED ORDER — DOXYCYCLINE HYCLATE 100 MG PO TABS
100.0000 mg | ORAL_TABLET | Freq: Two times a day (BID) | ORAL | 0 refills | Status: DC
  Filled 2023-07-30: qty 20, 10d supply, fill #0

## 2023-07-30 MED ORDER — SODIUM CHLORIDE 0.9 % IV SOLN: 2.0000 g | INTRAVENOUS | Status: DC

## 2023-07-30 MED ORDER — VANCOMYCIN HCL 1500 MG/300ML IV SOLN: 1500.0000 mg | INTRAVENOUS | Status: DC

## 2023-07-30 MED ORDER — LINEZOLID 600 MG PO TABS
600.0000 mg | ORAL_TABLET | Freq: Two times a day (BID) | ORAL | 0 refills | Status: AC
  Filled 2023-07-30: qty 28, 14d supply, fill #0

## 2023-07-30 MED ORDER — AMOXICILLIN-POT CLAVULANATE 500-125 MG PO TABS
1.0000 | ORAL_TABLET | Freq: Two times a day (BID) | ORAL | 0 refills | Status: AC
  Filled 2023-07-30: qty 20, 10d supply, fill #0

## 2023-07-30 NOTE — TOC Progression Note (Signed)
 Transition of Care [facility]) - Progression Note    Patient Details  Name: SALEM LEMBKE MRN: [id number] Date of Birth: 04/07/1940  Transition of Care [facility]) CM/SW Contact  Tomasa JAYSON Childes, RN Phone Number: 07/30/2023, 10:12 AM  Clinical Narrative:    Spoke with patient's wife to confirm request for home hospice per message received from MD.  Patient wife  confirms she would like [facility] and has previously been in contact with a representative from [facility] name Mliss. She confirms a BSC and oxygen is being requested. She has been advised a referral from this RNCM is being sent to the onsite [facility] rep, Marinell. Mrs. Savannah was advised [facility] will contact her today.   Referral for home hospice made to [facility] from [facility].          Expected Discharge Plan and Services                                               Social Determinants of Health (SDOH) Interventions SDOH Screenings   Food Insecurity: No Food Insecurity (07/28/2023)  Housing: Low Risk  (07/28/2023)  Transportation Needs: No Transportation Needs (07/28/2023)  Utilities: Not At Risk (07/28/2023)  Alcohol Screen: Low Risk  (07/15/2022)  Depression (PHQ2-9): Low Risk  (05/16/2023)  Financial Resource Strain: Low Risk  (02/13/2023)  Physical Activity: Insufficiently Active (02/13/2023)  Social Connections: Moderately Isolated (07/28/2023)  Stress: No Stress Concern Present (02/13/2023)  Tobacco Use: Medium Risk (07/27/2023)  Health Literacy: Adequate Health Literacy (09/09/2022)    Readmission Risk Interventions     No data to display

## 2023-07-30 NOTE — Evaluation (Addendum)
 Occupational Therapy Evaluation Patient Details Name: Bobby Thomas MRN: [id number] DOB: 11/30/1940 Today's Date: 07/30/2023   History of Present Illness   Pt admitted for sepsis secondary to pneumonia. HIstory of Afib, anxiety, SBO, CAD, HTN, HLD, DM, CVA, PCD, and stage 4 lung cancer. Of note, + L3 compression fx and new O2 needs.     Clinical Impressions Pt was seen for OT evaluation this date. PTA, pt resides at home with his wife where he was IND with all tasks without use of AD. 2 recent falls.  Pt presents to acute OT demonstrating impaired ADL performance and functional mobility 2/2 weakness, pain, balance deficits and low activity tolerance. Pt on 3L at rest with sp02 at 98%, placed on 2L and dropped to 85% with activity and pt replaced on 3L and maintained in 90's. Pt currently requires MOD I for bed mobility and supervision for STS and SPT to recliner. CGA for LB dressing to don bil socks at EOB.  Pt would benefit from skilled OT services to address noted impairments and functional limitations to maximize safety and independence while minimizing falls risk and caregiver burden. Do anticipate the need for follow up OT services upon acute hospital DC. Pt is hopeful to return home this date with hospice services to be initiated and DME ordered.      If plan is discharge home, recommend the following:   A little help with walking and/or transfers;A little help with bathing/dressing/bathroom;Assistance with cooking/housework;Help with stairs or ramp for entrance     Functional Status Assessment   Patient has had a recent decline in their functional status and demonstrates the ability to make significant improvements in function in a reasonable and predictable amount of time.     Equipment Recommendations   BSC/3in1;Hospital bed     Recommendations for Other Services         Precautions/Restrictions   Precautions Precautions: Fall Recall of  Precautions/Restrictions: Intact Restrictions Weight Bearing Restrictions Per Provider Order: No     Mobility Bed Mobility Overal bed mobility: Modified Independent             General bed mobility comments: increased time and effort, but no physical assist needed    Transfers Overall transfer level: Needs assistance Equipment used: 1 person hand held assist Transfers: Sit to/from Stand, Bed to chair/wheelchair/BSC Sit to Stand: Supervision     Step pivot transfers: Supervision     General transfer comment: supervision for STS via HHA and for SPT to recliner      Balance Overall balance assessment: History of Falls, Needs assistance (2 recent falls) Sitting-balance support: Feet supported, No upper extremity supported Sitting balance-Leahy Scale: Fair Sitting balance - Comments: dynamic balance more challenging with R lateral lean during LB dressing Postural control: Right lateral lean Standing balance support: Single extremity supported Standing balance-Leahy Scale: Fair Standing balance comment: HHA x1 with supervision                           ADL either performed or assessed with clinical judgement   ADL Overall ADL's : Needs assistance/impaired                     Lower Body Dressing: Contact guard assist;Supervision/safety;Sitting/lateral leans Lower Body Dressing Details (indicate cue type and reason): to don bil socks seated EOB with x1 LOB via R lateral lean Toilet Transfer: Supervision/safety Toilet Transfer Details (indicate cue type and reason): simulated to  recliner with HHA x1                 Vision         Perception         Praxis         Pertinent Vitals/Pain Pain Assessment Pain Assessment: No/denies pain     Extremity/Trunk Assessment Upper Extremity Assessment Upper Extremity Assessment: Overall WFL for tasks assessed   Lower Extremity Assessment Lower Extremity Assessment: Overall WFL for tasks  assessed       Communication Communication Communication: No apparent difficulties   Cognition Arousal: Alert Behavior During Therapy: WFL for tasks assessed/performed                                 Following commands: Intact       Cueing  General Comments   Cueing Techniques: Verbal cues  dropped to 85% on 2L with LB dressing, required 3L to improve to 90's   Exercises Other Exercises Other Exercises: Edu on role of OT in acute setting and ECS, PLB, and task simplification.   Shoulder Instructions      Home Living Family/patient expects to be discharged to:: Private residence Living Arrangements: Spouse/significant other Available Help at Discharge: Family Type of Home: House Home Access: Stairs to enter Secretary/administrator of Steps: 3 Entrance Stairs-Rails: Right;Left;Can reach both Home Layout: One level               Home Equipment: None          Prior Functioning/Environment Prior Level of Function : Independent/Modified Independent             Mobility Comments: reports he was ambulatory without AD in home ADLs Comments: indep    OT Problem List: Decreased activity tolerance;Impaired balance (sitting and/or standing)   OT Treatment/Interventions: Self-care/ADL training;Therapeutic exercise;Therapeutic activities;Energy conservation;Patient/family education;DME and/or AE instruction;Balance training      OT Goals(Current goals can be found in the care plan section)   Acute Rehab OT Goals Patient Stated Goal: go home OT Goal Formulation: With patient/family Time For Goal Achievement: 08/13/23 Potential to Achieve Goals: Fair ADL Goals Pt Will Perform Lower Body Bathing: with contact guard assist;with supervision;sitting/lateral leans;sit to/from stand Pt Will Perform Lower Body Dressing: with contact guard assist;with supervision;sit to/from stand;sitting/lateral leans Pt Will Transfer to Toilet: with  supervision;ambulating;regular height toilet Additional ADL Goal #1: Pt will demo 1 learned ECS during ADL performance 2/2 trials to maximize IND/safety for return home.   OT Frequency:  Min 2X/week    Co-evaluation              AM-PAC OT 6 Clicks Daily Activity     Outcome Measure Help from another person eating meals?: None Help from another person taking care of personal grooming?: None Help from another person toileting, which includes using toliet, bedpan, or urinal?: A Little Help from another person bathing (including washing, rinsing, drying)?: A Little Help from another person to put on and taking off regular upper body clothing?: None Help from another person to put on and taking off regular lower body clothing?: A Little 6 Click Score: 21   End of Session Equipment Utilized During Treatment: Oxygen Nurse Communication: Mobility status  Activity Tolerance: Patient tolerated treatment well Patient left: in chair;with call bell/phone within reach;with chair alarm set;with family/visitor present  OT Visit Diagnosis: Other abnormalities of gait and mobility (R26.89);Unsteadiness on feet (R26.81)  Time: 8886-8864 OT Time Calculation (min): 22 min Charges:  OT General Charges $OT Visit: 1 Visit OT Evaluation $OT Eval Moderate Complexity: 1 Mod OT Treatments $Self Care/Home Management : 8-22 mins Jaquavion Mccannon, OTR/L  07/30/23, 12:49 PM  Aundray Cartlidge E Reatha Sur 07/30/2023, 12:47 PM

## 2023-07-30 NOTE — Progress Notes (Signed)
 INFECTIOUS DISEASE PROGRESS NOTE Date of Admission:  07/27/2023     ID: Bobby Thomas is a 83 y.o. male with MRSA bacteemia Principal Problem:   Sepsis due to pneumonia [facility]) Active Problems:   HLD (hyperlipidemia)   Essential hypertension   Intermittent atrial fibrillation (HCC)   CKD stage 3b, GFR 30-44 ml/min (HCC)   Type 2 diabetes mellitus with diabetic chronic kidney disease (HCC)   Hemoptysis   Primary lung squamous cell carcinoma, left (HCC)   Acute respiratory insufficiency   Hyponatremia   Subjective: NO fevers, family wishes and pat wants to go home on hospice. Declines TEE, PICC  ROS  Eleven systems are reviewed and negative except per hpi  Medications:  Antibiotics Given (last 72 hours)     Date/Time Action Medication Dose Rate   07/27/23 1654 New Bag/Given   ceFEPIme (MAXIPIME) 2 g in sodium chloride  0.9 % 100 mL IVPB 2 g 200 mL/hr   07/27/23 1719 New Bag/Given   metroNIDAZOLE (FLAGYL) IVPB 500 mg 500 mg 100 mL/hr   07/27/23 1824 New Bag/Given   vancomycin (VANCOCIN) IVPB 1000 mg/200 mL premix 1,000 mg 200 mL/hr   07/27/23 2036 New Bag/Given   azithromycin (ZITHROMAX) 500 mg in sodium chloride  0.9 % 250 mL IVPB 500 mg 250 mL/hr   07/28/23 0953 New Bag/Given   ceFEPIme (MAXIPIME) 2 g in sodium chloride  0.9 % 100 mL IVPB 2 g 200 mL/hr   07/28/23 1800 New Bag/Given   azithromycin (ZITHROMAX) 500 mg in sodium chloride  0.9 % 250 mL IVPB 500 mg 250 mL/hr   07/28/23 2051 New Bag/Given  [cc]   ceFEPIme (MAXIPIME) 2 g in sodium chloride  0.9 % 100 mL IVPB 2 g 200 mL/hr   07/29/23 0201 New Bag/Given   vancomycin (VANCOREADY) IVPB 1750 mg/350 mL 1,750 mg 175 mL/hr   07/29/23 0835 New Bag/Given   ceFEPIme (MAXIPIME) 2 g in sodium chloride  0.9 % 100 mL IVPB 2 g 200 mL/hr   07/29/23 1731 New Bag/Given   azithromycin (ZITHROMAX) 500 mg in sodium chloride  0.9 % 250 mL IVPB 500 mg 250 mL/hr   07/29/23 2114 New Bag/Given   ceFEPIme (MAXIPIME) 2 g in sodium chloride  0.9 %  100 mL IVPB 2 g 200 mL/hr   07/30/23 0902 New Bag/Given   vancomycin (VANCOREADY) IVPB 750 mg/150 mL 750 mg 150 mL/hr   07/30/23 0903 New Bag/Given   ceFEPIme (MAXIPIME) 2 g in sodium chloride  0.9 % 100 mL IVPB 2 g 200 mL/hr       apixaban   2.5 mg Oral BID   ezetimibe   10 mg Oral Daily   insulin  aspart  0-9 Units Subcutaneous TID WC   levalbuterol  0.63 mg Nebulization TID   metoprolol  succinate  25 mg Oral Daily   rosuvastatin   10 mg Oral Daily    Objective: Vital signs in last 24 hours: Temp:  [97.7 F (36.5 C)-98.5 F (36.9 C)] 97.7 F (36.5 C) (06/25 0746) Pulse Rate:  [88-100] 100 (06/25 0746) Resp:  [18-19] 19 (06/25 0449) BP: (124-145)/(68-80) 134/78 (06/25 0746) SpO2:  [92 %-97 %] 95 % (06/25 1342) FiO2 (%):  [32 %] 32 % (06/24 2023) Chroncially ill appearing RRR Dec BS bases Soft NT ND   Lab Results Recent Labs    07/29/23 0520 07/30/23 0318  WBC 35.1* 30.5*  HGB 10.5* 10.4*  HCT 32.1* 31.4*  NA 132* 129*  K 3.6 3.7  CL 97* 96*  CO2 24 20*  BUN 36* 51*  CREATININE 1.76* 2.15*    Microbiology: Results for orders placed or performed during the hospital encounter of 07/27/23  Blood culture (routine x 2)     Status: Abnormal (Preliminary result)   Collection Time: 07/27/23  4:35 PM   Specimen: BLOOD LEFT ARM  Result Value Ref Range Status   Specimen Description   Final    BLOOD LEFT ARM Performed at [facility], [address], [place], [place] [address]    Special Requests   Final    BOTTLES DRAWN AEROBIC AND ANAEROBIC Blood Culture adequate volume Performed at [facility], [address], [place], [place] [address]    Culture  Setup Time   Final    GRAM POSITIVE COCCI AEROBIC BOTTLE ONLY CRITICAL VALUE NOTED.  VALUE IS CONSISTENT WITH PREVIOUSLY REPORTED AND CALLED VALUE. Performed at [facility], [address], [place], [place] [address]    Culture (A)  Final    STAPHYLOCOCCUS AUREUS SUSCEPTIBILITIES TO  FOLLOW Performed at [facility], [address] [place], [place] [address]    Report Status PENDING  Incomplete  Blood culture (routine x 2)     Status: Abnormal (Preliminary result)   Collection Time: 07/27/23  4:35 PM   Specimen: BLOOD RIGHT ARM  Result Value Ref Range Status   Specimen Description   Final    BLOOD RIGHT ARM Performed at [facility], [address], [place], [place] [address]    Special Requests   Final    BOTTLES DRAWN AEROBIC AND ANAEROBIC Blood Culture adequate volume Performed at [facility], [address] [place], [place] [address]    Culture  Setup Time   Final    GRAM POSITIVE COCCI AEROBIC BOTTLE ONLY CRITICAL RESULT CALLED TO, READ BACK BY AND VERIFIED WITH:  NATHAN BELUE AT 0031 07/29/23 JG    Culture (A)  Final    STAPHYLOCOCCUS AUREUS SUSCEPTIBILITIES TO FOLLOW Performed at [facility], [address], [place], [place] [address]    Report Status PENDING  Incomplete  Blood Culture ID Panel (Reflexed)     Status: Abnormal   Collection Time: 07/27/23  4:35 PM  Result Value Ref Range Status   Enterococcus faecalis NOT DETECTED NOT DETECTED Final   Enterococcus Faecium NOT DETECTED NOT DETECTED Final   Listeria monocytogenes NOT DETECTED NOT DETECTED Final   Staphylococcus species DETECTED (A) NOT DETECTED Corrected    Comment: CRITICAL RESULT CALLED TO, READ BACK BY AND VERIFIED WITH:  NATHAN BELUE AT 0031 07/29/23 JG CORRECTED ON 06/24 AT 9297: PREVIOUSLY REPORTED AS DETECTED  NATHAN BELUE AT 0031 07/29/23 JG    Staphylococcus aureus (BCID) DETECTED (A) NOT DETECTED Final    Comment: Methicillin (oxacillin)-resistant Staphylococcus aureus (MRSA). MRSA is predictably resistant to beta-lactam antibiotics (except ceftaroline). Preferred therapy is vancomycin unless clinically contraindicated. Patient requires contact precautions if  hospitalized. CRITICAL RESULT CALLED TO, READ BACK BY AND VERIFIED WITH:  NATHAN  BELUE AT 0031 07/29/23 JG    Staphylococcus epidermidis NOT DETECTED NOT DETECTED Final   Staphylococcus lugdunensis NOT DETECTED NOT DETECTED Final   Streptococcus species NOT DETECTED NOT DETECTED Final   Streptococcus agalactiae NOT DETECTED NOT DETECTED Final   Streptococcus pneumoniae NOT DETECTED NOT DETECTED Final   Streptococcus pyogenes NOT DETECTED NOT DETECTED Final   A.calcoaceticus-baumannii NOT DETECTED NOT DETECTED Final   Bacteroides fragilis NOT DETECTED NOT DETECTED Final   Enterobacterales NOT DETECTED NOT DETECTED Final   Enterobacter cloacae complex NOT  DETECTED NOT DETECTED Final   Escherichia coli NOT DETECTED NOT DETECTED Final   Klebsiella aerogenes NOT DETECTED NOT DETECTED Final   Klebsiella oxytoca NOT DETECTED NOT DETECTED Final   Klebsiella pneumoniae NOT DETECTED NOT DETECTED Final   Proteus species NOT DETECTED NOT DETECTED Final   Salmonella species NOT DETECTED NOT DETECTED Final   Serratia marcescens NOT DETECTED NOT DETECTED Final   Haemophilus influenzae NOT DETECTED NOT DETECTED Final   Neisseria meningitidis NOT DETECTED NOT DETECTED Final   Pseudomonas aeruginosa NOT DETECTED NOT DETECTED Final   Stenotrophomonas maltophilia NOT DETECTED NOT DETECTED Final   Candida albicans NOT DETECTED NOT DETECTED Final   Candida auris NOT DETECTED NOT DETECTED Final   Candida glabrata NOT DETECTED NOT DETECTED Final   Candida krusei NOT DETECTED NOT DETECTED Final   Candida parapsilosis NOT DETECTED NOT DETECTED Final   Candida tropicalis NOT DETECTED NOT DETECTED Final   Cryptococcus neoformans/gattii NOT DETECTED NOT DETECTED Final   Meth resistant mecA/C and MREJ DETECTED (A) NOT DETECTED Final    Comment: CRITICAL RESULT CALLED TO, READ BACK BY AND VERIFIED WITH:  NATHAN BELUE AT 0031 07/29/23 JG Performed at [facility], [address] [place], [place] [address]   Expectorated Sputum Assessment w Gram Stain, Rflx to Resp Cult     Status:  None   Collection Time: 07/28/23  1:00 PM   Specimen: Sputum  Result Value Ref Range Status   Specimen Description SPUTUM  Final   Special Requests NONE  Final   Sputum evaluation   Final    THIS SPECIMEN IS ACCEPTABLE FOR SPUTUM CULTURE Performed at [facility], [address] [place], [place] [address]    Report Status 07/29/2023 FINAL  Final  Culture, Respiratory w Gram Stain     Status: None (Preliminary result)   Collection Time: 07/28/23  1:00 PM   Specimen: SPU  Result Value Ref Range Status   Specimen Description   Final    SPUTUM Performed at [facility], [address] [place], [place] [address]    Special Requests   Final    NONE Reflexed from [phone] [phone] Performed at [facility], [address] [place], [place] [address]    Gram Stain   Final    FEW WBC PRESENT, PREDOMINANTLY PMN NO ORGANISMS SEEN    Culture   Final    CULTURE REINCUBATED FOR BETTER GROWTH Performed at [facility], [address] [place], [place] [address]    Report Status PENDING  Incomplete  MRSA Next Gen by PCR, Nasal     Status: None   Collection Time: 07/28/23  4:36 PM   Specimen: Nasal Mucosa; Nasal Swab  Result Value Ref Range Status   MRSA by PCR Next Gen NOT DETECTED NOT DETECTED Final    Comment: (NOTE) The GeneXpert MRSA Assay (FDA approved for NASAL specimens only), is one component of a comprehensive MRSA colonization surveillance program. It is not intended to diagnose MRSA infection nor to guide or monitor treatment for MRSA infections. Test performance is not FDA approved in patients less than 65 years old. Performed at [facility], [address] [place], [place] [address]   Culture, blood (single) w Reflex to ID Panel     Status: None (Preliminary result)   Collection Time: 07/30/23  3:18 AM   Specimen: BLOOD RIGHT ARM  Result Value Ref Range Status   Specimen Description BLOOD RIGHT ARM  Final   Special  Requests    Final    BOTTLES DRAWN AEROBIC AND ANAEROBIC Blood Culture adequate volume   Culture   Final    NO GROWTH < 12 HOURS Performed at [facility], [address] [place] [place], [place] [address]    Report Status PENDING  Incomplete    Studies/Results: ECHOCARDIOGRAM COMPLETE Result Date: 07/30/2023    ECHOCARDIOGRAM REPORT   Patient Name:   Bobby Thomas Date of Exam: 07/29/2023 Medical Rec #:  [id number]       Height:       67.0 in Accession #:    [id number]      Weight:       170.0 lb Date of Birth:  October 17, 1940       BSA:          1.887 m Patient Age:    82 years        BP:           106/57 mmHg Patient Gender: M               HR:           98 bpm. Exam Location:  [facility] Procedure: 2D Echo, Cardiac Doppler and Color Doppler (Both Spectral and Color            Flow Doppler were utilized during procedure). Indications:     R78.81 Bacteremia.  History:         Patient has no prior history of Echocardiogram examinations.                  CAD, Stroke, Arrythmias:Atrial Fibrillation; Risk                  Factors:Dyslipidemia.  Sonographer:     Carl Coma RDCS Referring Phys:  [id number] Keyleigh Manninen P Darian Cansler Diagnosing Phys: Evalene Lunger MD IMPRESSIONS  1. No valve vegetation noted. Valves well visualized.  2. Left ventricular ejection fraction, by estimation, is 55 to 60%. The left ventricle has normal function. The left ventricle has no regional wall motion abnormalities. Left ventricular diastolic parameters are indeterminate.  3. Right ventricular systolic function is normal. The right ventricular size is normal. There is moderately elevated pulmonary artery systolic pressure. The estimated right ventricular systolic pressure is 52.1 mmHg.  4. The mitral valve is normal in structure. No evidence of mitral valve regurgitation. No evidence of mitral stenosis.  5. The aortic valve is normal in structure. Aortic valve regurgitation is mild. No aortic stenosis is present.  6. The inferior vena cava is  normal in size with greater than 50% respiratory variability, suggesting right atrial pressure of 3 mmHg. FINDINGS  Left Ventricle: Left ventricular ejection fraction, by estimation, is 55 to 60%. The left ventricle has normal function. The left ventricle has no regional wall motion abnormalities. Strain was performed and the global longitudinal strain is indeterminate. The left ventricular internal cavity size was normal in size. There is no left ventricular hypertrophy. Left ventricular diastolic parameters are indeterminate. Right Ventricle: The right ventricular size is normal. No increase in right ventricular wall thickness. Right ventricular systolic function is normal. There is moderately elevated pulmonary artery systolic pressure. The tricuspid regurgitant velocity is 3.43 m/s, and with an assumed right atrial pressure of 5 mmHg, the estimated right ventricular systolic pressure is 52.1 mmHg. Left Atrium: Left atrial size was normal in size. Right Atrium: Right atrial size was normal in size. Pericardium: There is no evidence of pericardial effusion. Mitral Valve: The mitral  valve is normal in structure. No evidence of mitral valve regurgitation. No evidence of mitral valve stenosis. Tricuspid Valve: The tricuspid valve is normal in structure. Tricuspid valve regurgitation is mild . No evidence of tricuspid stenosis. The aortic valve is normal in structure. Aortic valve regurgitation is mild. No aortic stenosis is present. Pulmonic Valve: The pulmonic valve was normal in structure. Pulmonic valve regurgitation is mild. No evidence of pulmonic stenosis. Aorta: The aortic root is normal in size and structure. Venous: The inferior vena cava is normal in size with greater than 50% respiratory variability, suggesting right atrial pressure of 3 mmHg. IAS/Shunts: No atrial level shunt detected by color flow Doppler. Additional Comments: 3D was performed not requiring image post processing on an independent  workstation and was indeterminate.  LEFT VENTRICLE PLAX 2D LVIDd:         4.30 cm LVIDs:         2.70 cm LV PW:         0.80 cm LV IVS:        0.80 cm LVOT diam:     2.10 cm LV SV:         38 LV SV Index:   20 LVOT Area:     3.46 cm  RIGHT VENTRICLE             IVC RV Basal diam:  4.40 cm     IVC diam: 2.10 cm RV S prime:     10.49 cm/s TAPSE (M-mode): 1.7 cm LEFT ATRIUM              Index        RIGHT ATRIUM           Index LA diam:        4.90 cm  2.60 cm/m   RA Area:     28.10 cm LA Vol (A2C):   60.6 ml  32.11 ml/m  RA Volume:   98.50 ml  52.19 ml/m LA Vol (A4C):   150.0 ml 79.48 ml/m LA Biplane Vol: 103.0 ml 54.58 ml/m  AORTIC VALVE LVOT Vmax:   71.60 cm/s LVOT Vmean:  45.400 cm/s LVOT VTI:    0.111 m AI PHT:      269 msec  AORTA Ao Root diam: 3.80 cm Ao Asc diam:  3.60 cm MV E velocity: 96.68 cm/s  TRICUSPID VALVE                            TR Peak grad:   47.1 mmHg                            TR Vmax:        343.00 cm/s                             SHUNTS                            Systemic VTI:  0.11 m                            Systemic Diam: 2.10 cm Evalene Lunger MD Electronically signed by Evalene Lunger MD Signature Date/Time: 07/30/2023/7:24:29 AM    Final     Assessment/Plan: Bobby Thomas is a 83 y.o. male  with stage 4 Lung cancer , not undergoing treatment due to patient choice, admitted with abd pain and hemoptysis. Found to have MRSA bacteremia, L rib fractures from a fall and vert fracture as well as worsening airspace disease   5/25- wishes to go home on hospice and declines TEE PICC. TTE was neg for vegs  Recommendations MRSA bacteremia- likely source is skin lesions and abrasions from fall. No current signs metastatic infection. No prosthetic hardware, ports or pacemakers Repeat bcx NGTD Neg  ECHO - TTE Pt wishes to go home on hospice on orals. Will rec linezolid x 14 days   PNA sputum cx pending-dcing on linezolid and augmentin  Thank you very much for the consult. Will  follow with you.  Alm SHAUNNA Needle   07/30/2023, 2:40 PM

## 2023-07-30 NOTE — Progress Notes (Signed)
 [facility] Liaison Note  Received request from Keona Alston, RN,  Transitions of Care Manager, for hospice services at home after discharge.  Spoke with patient, spouse and daughter to initiate education related to hospice philosophy, services, and team approach to care.  Patient and family  verbalized understanding of information given.  Per discussion, the plan is for discharge home today via [facility].    DME needs discussed.  Patient has the following equipment in the home: Shower chair.  Patient/family requests the following equipment in the home: home 02, hospital bed [facility] and walker  The address has been verified and is correct in the chart. Nethaniel Mattie and phone number [phone] is the family contact to arrange time of equipment delivery.   Please send signed and completed DNR home with the patient/family.  Please provide prescriptions at discharge as needed to ensure ongoing symptom management.   [facility] information and contact numbers given to spouse.   Above information shared with Keona Alston, RN, Transitions of Care Manager.     Please call with any Hospice related questions or concerns.  Thank you for the opportunity to participate in this patient's care.  Marinell Nova, [facility] Liaison [phone]

## 2023-07-30 NOTE — Evaluation (Signed)
 Physical Therapy Evaluation Patient Details Name: Bobby Thomas MRN: [id number] DOB: 11-10-1940 Today's Date: 07/30/2023  History of Present Illness  Pt admitted for sepsis secondary to pneumonia. HIstory of Afib, anxiety, SBO, CAD, HTN, HLD, DM, CVA, PCD, and stage 4 lung cancer. Of note, + L3 compression fx and new O2 needs.  Clinical Impression  Pt is a pleasant 83 year old male who was admitted for sepsis secondary to pneumonia. Pt performs bed mobility with mod I and able to sit at EOB for several minutes prior to self electing to return to bed. Further transfers/mobility declined as pt just returned to bed from sitting up in recliner. Pt is hopeful to dc home this date with hospice services initiated. Pt demonstrates deficits with functional weakness/endurance. All mobility performed on 3L of O2. Would benefit from skilled PT to address above deficits and promote optimal return to PLOF. Pt will continue to receive skilled PT services while admitted and will defer to TOC/care team for updates regarding disposition planning.       If plan is discharge home, recommend the following: A little help with walking and/or transfers;A little help with bathing/dressing/bathroom;Assist for transportation;Help with stairs or ramp for entrance   Can travel by private vehicle        Equipment Recommendations Rolling walker (2 wheels);BSC/3in1;Hospital bed  Recommendations for Other Services       Functional Status Assessment Patient has had a recent decline in their functional status and demonstrates the ability to make significant improvements in function in a reasonable and predictable amount of time.     Precautions / Restrictions Precautions Precautions: Fall Recall of Precautions/Restrictions: Intact Restrictions Weight Bearing Restrictions Per Provider Order: No      Mobility  Bed Mobility Overal bed mobility: Modified Independent             General bed mobility comments:  safe technique, however fatigues quickly while seated at EOB, after sitting ~5 mins- returns self back in bed, declining further mobility. Reports he was in chair for 45 min earlier this date    Transfers                   General transfer comment: declines    Ambulation/Gait                  Stairs            Wheelchair Mobility     Tilt Bed    Modified Rankin (Stroke Patients Only)       Balance Overall balance assessment: History of Falls, Needs assistance (2 recent falls) Sitting-balance support: Feet supported, No upper extremity supported Sitting balance-Leahy Scale: Fair                                       Pertinent Vitals/Pain Pain Assessment Pain Assessment: No/denies pain    Home Living Family/patient expects to be discharged to:: Private residence Living Arrangements: Spouse/significant other Available Help at Discharge: Family Type of Home: House Home Access: Stairs to enter Entrance Stairs-Rails: Right;Left;Can reach both Secretary/administrator of Steps: 3   Home Layout: One level Home Equipment: None      Prior Function Prior Level of Function : Independent/Modified Independent             Mobility Comments: reports he was ambulatory without AD in home ADLs Comments: indep     Extremity/Trunk  Assessment   Upper Extremity Assessment Upper Extremity Assessment: Overall WFL for tasks assessed    Lower Extremity Assessment Lower Extremity Assessment: Overall WFL for tasks assessed       Communication   Communication Communication: No apparent difficulties    Cognition Arousal: Alert Behavior During Therapy: WFL for tasks assessed/performed   PT - Cognitive impairments: No apparent impairments                       PT - Cognition Comments: occasionally has word finding issues. Some of history obtained from spouse and daughter at bedside Following commands: Intact       Cueing  Cueing Techniques: Verbal cues     General Comments      Exercises     Assessment/Plan    PT Assessment Patient needs continued PT services  PT Problem List Decreased strength;Decreased balance;Decreased activity tolerance;Decreased mobility;Decreased knowledge of use of DME;Decreased safety awareness       PT Treatment Interventions DME instruction;Gait training;Therapeutic exercise;Balance training    PT Goals (Current goals can be found in the Care Plan section)  Acute Rehab PT Goals Patient Stated Goal: to go home PT Goal Formulation: With patient Time For Goal Achievement: 08/13/23 Potential to Achieve Goals: Good    Frequency Min 1X/week     Co-evaluation               AM-PAC PT 6 Clicks Mobility  Outcome Measure Help needed turning from your back to your side while in a flat bed without using bedrails?: None Help needed moving from lying on your back to sitting on the side of a flat bed without using bedrails?: None Help needed moving to and from a bed to a chair (including a wheelchair)?: A Little Help needed standing up from a chair using your arms (e.g., wheelchair or bedside chair)?: A Little Help needed to walk in hospital room?: A Little Help needed climbing 3-5 steps with a railing? : A Lot 6 Click Score: 19    End of Session Equipment Utilized During Treatment: Oxygen Activity Tolerance: Patient limited by fatigue Patient left: in bed;with bed alarm set Nurse Communication: Mobility status PT Visit Diagnosis: Muscle weakness (generalized) (M62.81);Repeated falls (R29.6);Difficulty in walking, not elsewhere classified (R26.2)    Time: 8961-8942 PT Time Calculation (min) (ACUTE ONLY): 19 min   Charges:   PT Evaluation $PT Eval Low Complexity: 1 Low   PT General Charges $$ ACUTE PT VISIT: 1 Visit         Corean Dade, PT, DPT, GCS [phone]   Krishauna Schatzman 07/30/2023, 11:28 AM

## 2023-07-30 NOTE — TOC Transition Note (Signed)
 Transition of Care [facility]) - Discharge Note   Patient Details  Name: Bobby Thomas MRN: [id number] Date of Birth: 07-Nov-1940  Transition of [facility]) CM/SW Contact:  Jacynda Brunke C Verlena Marlette, RN Phone Number: 07/30/2023, 11:17 AM   Clinical Narrative:    Discharging home with [facility]. Once DME delivery has been confirmed by [place], [facility] Liaison, EMS can be arranged.  Face sheet and medical necessity forms printed to floor to be added to EMS pack.  Nurse notified of details for discharge.    Final next level of care: Home w Home Health Services Photographer) Barriers to Discharge: Barriers Resolved   Patient Goals and CMS Choice Patient states their goals for this hospitalization and ongoing recovery are:: home with hospice CMS Medicare.gov Compare Post Acute Care list provided to:: Patient Represenative (must comment)        Discharge Placement                       Discharge Plan and Services Additional resources added to the After Visit Summary for                                       Social Drivers of Health (SDOH) Interventions SDOH Screenings   Food Insecurity: No Food Insecurity (07/28/2023)  Housing: Low Risk  (07/28/2023)  Transportation Needs: No Transportation Needs (07/28/2023)  Utilities: Not At Risk (07/28/2023)  Alcohol Screen: Low Risk  (07/15/2022)  Depression (PHQ2-9): Low Risk  (05/16/2023)  Financial Resource Strain: Low Risk  (02/13/2023)  Physical Activity: Insufficiently Active (02/13/2023)  Social Connections: Moderately Isolated (07/28/2023)  Stress: No Stress Concern Present (02/13/2023)  Tobacco Use: Medium Risk (07/27/2023)  Health Literacy: Adequate Health Literacy (09/09/2022)     Readmission Risk Interventions     No data to display

## 2023-07-30 NOTE — Progress Notes (Signed)
 Pharmacy Antibiotic Note  Bobby Thomas is a 83 y.o. male admitted on 07/27/2023 with MRSA bacteremia.  Patient with PMH advanced lung cancer (per note he declined chemotherapy). He presented with abdominal pain.  CT chest/abdomen/pelvis showed small pleural effusion, possible pneumonia, increased lung mass, stable AAA, subacute rib fractures, cystic area of pancreas. Pharmacy has been consulted for Vancomycin dosing.  Today, 07/30/2023 Day # 3 vancomycin, Cefepime and azithromycin) Renal: CKD - SCr increased overnight to 2.15 WBC 30.5 (improved) Afebrile 6/22 blood cx: 1 of 4 bottles GPC, BCID = MRSA No central lines, cardiac devices, or valves 6/25 ECHO: no vegetations (valves well visualized)  Plan: Adjust vancomycin dose to 1500mg  IV q48h - next dose due 6/26 Estimated AUC with above dose is 526 with Goal AUC 400-600. SCr used: 2.15 Estimated trough = 12 mcg/mL Adjust Cefepime to 2gm IV q24h as CrCl < 88ml/min Follow renal function closely Check levels at steady-state F/u ability to stop concomitant antibiotics Await ID evaluation (automatic ID consult for MRSA bacteremia) - follow repeat blood cx. Long-term plan for antibiotics  Temp (24hrs), Avg:98.1 F (36.7 C), Min:97.7 F (36.5 C), Max:98.5 F (36.9 C)   Recent Labs  Lab 07/27/23 0503 07/27/23 1541 07/27/23 1635 07/27/23 1832 07/28/23 0503 07/29/23 0520 07/30/23 0318  WBC  --  32.7*  --   --  40.9* 35.1* 30.5*  CREATININE  --  1.93*  --   --  1.77* 1.76* 2.15*  LATICACIDVEN 1.9  --  2.0* 2.3*  --   --   --     Estimated Creatinine Clearance: 25.6 mL/min (A) (by C-G formula based on SCr of 2.15 mg/dL (H)).    Allergies  Allergen Reactions   Hydrocodone-Guaifenesin Shortness Of Breath and Other (See Comments)    Not able to sit still when using Codiclear DH   Bee Venom Other (See Comments)   Codeine Other (See Comments)    Other reaction(s): Pruritus   Guaifenesin-Codeine Other (See Comments)   Morphine  Other (See Comments)   Morphine Sulfate     Other reaction(s): Pruritus   Semaglutide  Other (See Comments)    Felt bad while taking Rybelsus  03/2023   Warfarin Sodium Diarrhea    Antimicrobials this admission: 6/22 Azithromycin >> x 5 doses 6/23 Cefepime >>  6/24 Vancomycin >>   Microbiology results: 6/22 BCx: 1 (aerobic) of 4 bottles with MRSA (mecA/C & MREJ detected  6/22 Sputum: not collected 6/23 MRSA PCR: not detected  Thank you for allowing pharmacy to be a part of this patient's care.  Jodel Mayhall, PharmD, BCPS, BCIDP Work Cell: [phone] 07/30/2023 10:06 AM

## 2023-07-30 NOTE — Discharge Instructions (Signed)
 While on the antibiotic linezolid avoid or minimize following foods and drinks as they may interact with linezolid.  These foods and drinks contain tyramine.  Tyramine is a natural product found in some plants and animals.  Too much tyramine in combination with linezolid can cause high levels of serotonin in the body.  Serotonin is a chemical in our body that controls mood, sleep, digestion, and other functions.  Several signs of too much serotonin in the body (also called serotonin syndrome) are fast heart rate, sweating, fevers, high blood pressure, muscle twitching, and confusion.  It is important to seek medical attention if you have these symptoms.   Avoid foods and beverages that are high in tyramine while taking linezolid, including: Alcoholic beverages (such as beer, red wine, vermouth, sherry) Aged cheeses (such as cheddar, blue cheese, swiss, feta, parmesan, camembert) Fermented or pickled foods (such as sauerkraut, pickled beets, pickled peppers, pickled cucumbers/pickles, kim chee/kimchi)  Dried/aged, smoked or processed meats and sausages (such as salami, pepperoni, liverwurst, hot dogs, bologna, bacon) Soybean products (such as soy sauce, tofu) Preserved fish (such as pickled herring) Products that contain large amounts of yeast (such as bouillon cubes, powdered soup/gravy, homemade or sourdough bread)  Broad/fava beans  Following foods are OK to eat while taking linezolid: Pasteurized cheeses (such as [place], ricotta, cottage cheese, cream cheese) Vegetables (not fermented or pickled) Non-cured or smoked meats

## 2023-07-30 NOTE — Discharge Summary (Signed)
 Physician Discharge Summary   [name] MRN: [id number] DOB: [birth year]  Admit date:     07/27/2023  Discharge date: 07/30/23  Discharge Physician: Murvin Mana   PCP: Gasper Nancyann BRAVO, MD   Recommendations at discharge:   Follow up with hospice as outpatient   Discharge Diagnoses: Principal Problem:   Sepsis due to pneumonia [facility]) Active Problems:   HLD (hyperlipidemia)   Essential hypertension   Intermittent atrial fibrillation (HCC)   CKD stage 3b, GFR 30-44 ml/min (HCC)   Type 2 diabetes mellitus with diabetic chronic kidney disease (HCC)   Hemoptysis   Primary lung squamous cell carcinoma, left (HCC)   Acute respiratory insufficiency   Hyponatremia  Resolved Problems:   * No resolved hospital problems. *  Hospital Course: 83 y.o. male with a PMH significant for atrial fibrillation, actinic keratosis, allergy, anxiety, history of small bowel obstruction, carotid artery disease, coronary artery disease, hypertension, hyperlipidemia, type 2 diabetes, history of CVA, aphasia, peripheral arterial disease, stage IIIb CKD, clotting disorder, stage IV lung cancer who presented to the emergency department with a left-sided abdominal pain. In the hospital, patient was found to have significant hypoxemia with shortness of breath, tachypnea.  Was placed on high flow oxygen.  Chest CT scan showed left-sided lung cancer, with obstruction of the main bronchus and consolidation. Condition is consistent with postobstructive pneumonia.  Patient was treated with broad-spectrum antibiotics. After met with the family, patient and family decide against further inpatient treatment, they have previously refused any cancer treatment.  They wish to go home with hospice.  Anticipating death in 2 weeks to 3 months.  Assessment and Plan: Acute respiratory failure and severe sepsis secondary to PNA: Postobstructive pneumonia. MRSA septicemia secondary to pneumonia. Severe sepsis secondary  to pneumonia. Patient met severe sepsis criteria at time of admission with tachycardia, tachypnea, severe leukocytosis.  Patient also has elevated lactic acid level, elevated procalcitonin level. This is secondary to postobstructive pneumonia from lung cancer. After discussion with the family, no additional treatment is desired.  Wishes to go home with hospice.  I have prescribed 10-day course of Augmentin and doxycycline.     Rib fractures: Managed by hospice for pain management.  Normocytic anemia: No additional treatment.     Hemoptysis secondary to lung cancer  Stage III lung cancer; No additional treatment is desired.   CKD Stage IIIa Hyponatremia: No additional treatment due to hospice care   Hyperbilirubinemia: - notably elevated on admission but now resolved     HLD (hyperlipidemia) No additional treatment.     Essential hypertension Resumed metoprolol .     Intermittent atrial fibrillation (HCC) On metoprolol , discontinued anticoagulation due to poor prognosis from cancer     Type 2 diabetes mellitus with diabetic chronic kidney disease (HCC) uncontrolled with hyperglycemia. - A1C > 9.8 No additional treatment due to prognosis with hospice care      Consultants: ID Procedures performed: None  Disposition: Hospice care Diet recommendation:  Discharge Diet Orders (From admission, onward)     Start     Ordered   07/30/23 0000  Diet general       Comments: Regular diet   07/30/23 1058           Regular diet DISCHARGE MEDICATION: Allergies as of 07/30/2023       Reactions   Hydrocodone-guaifenesin Shortness Of Breath, Other (See Comments)   Not able to sit still when using Codiclear DH   Bee Venom Other (See Comments)   Codeine  Other (See Comments)   Other reaction(s): Pruritus   Guaifenesin-codeine Other (See Comments)   Morphine Other (See Comments)   Morphine Sulfate    Other reaction(s): Pruritus   Semaglutide  Other (See Comments)   Felt bad  while taking Rybelsus  03/2023   Warfarin Sodium Diarrhea        Medication List     STOP taking these medications    dapagliflozin  propanediol 10 MG Tabs tablet Commonly known as: Farxiga    Eliquis  2.5 MG Tabs tablet Generic drug: apixaban    ezetimibe  10 MG tablet Commonly known as: ZETIA    furosemide 40 MG tablet Commonly known as: LASIX   glipiZIDE  10 MG tablet Commonly known as: GLUCOTROL    OneTouch Ultra test strip Generic drug: glucose blood   pioglitazone  45 MG tablet Commonly known as: ACTOS    rosuvastatin  10 MG tablet Commonly known as: CRESTOR    tirzepatide  2.5 MG/0.5ML Pen Commonly known as: MOUNJARO        TAKE these medications    amoxicillin-clavulanate 500-125 MG tablet Commonly known as: Augmentin Take 1 tablet by mouth 2 (two) times daily for 10 days.   Combivent Respimat 20-100 MCG/ACT Aers respimat Generic drug: Ipratropium-Albuterol Inhale 1 puff into the lungs every 6 (six) hours.   doxycycline 100 MG tablet Commonly known as: VIBRA-TABS Take 1 tablet (100 mg total) by mouth 2 (two) times daily for 10 days.   EPINEPHrine  0.3 mg/0.3 mL Soaj injection Commonly known as: EPI-PEN Inject 0.3 mg into the muscle as needed.   LORazepam  1 MG tablet Commonly known as: ATIVAN  TAKE 1/2 TO 1 TABLET BY MOUTH NIGHTLY AT BEDTIME   metoprolol  succinate 25 MG 24 hr tablet Commonly known as: TOPROL -XL Take 1 tablet (25 mg total) by mouth daily.        Follow-up Information     Gasper Nancyann BRAVO, MD Follow up in 1 week(s).   Specialty: Family Medicine Contact information: [address] [place] [address] [phone]                Discharge Exam: Fredricka Weights   07/27/23 1555  Weight: 77.1 kg   General exam: Appears calm and comfortable  Respiratory system: Clear to auscultation. Respiratory effort normal. Cardiovascular system: S1 & S2 heard, RRR. No JVD, murmurs, rubs, gallops or clicks. No pedal  edema. Gastrointestinal system: Abdomen is nondistended, soft and nontender. No organomegaly or masses felt. Normal bowel sounds heard. Central nervous system: Alert and oriented x3. No focal neurological deficits. Extremities: Symmetric 5 x 5 power. Skin: No rashes, lesions or ulcers Psychiatry: Mood & affect appropriate.    Condition at discharge: poor  The results of significant diagnostics from this hospitalization (including imaging, microbiology, ancillary and laboratory) are listed below for reference.   Imaging Studies: ECHOCARDIOGRAM COMPLETE Result Date: 07/30/2023    ECHOCARDIOGRAM REPORT   Patient Name:   Bobby Thomas Date of Exam: 07/29/2023 Medical Rec #:  [id number]       Height:       67.0 in Accession #:    [id number]      Weight:       170.0 lb Date of Birth:  [birth year]       BSA:          1.887 m Patient Age:    82 years        BP:           106/57 mmHg Patient Gender: M  HR:           98 bpm. Exam Location:  [facility] Procedure: 2D Echo, Cardiac Doppler and Color Doppler (Both Spectral and Color            Flow Doppler were utilized during procedure). Indications:     R78.81 Bacteremia.  History:         Patient has no prior history of Echocardiogram examinations.                  CAD, Stroke, Arrythmias:Atrial Fibrillation; Risk                  Factors:Dyslipidemia.  Sonographer:     Carl Coma RDCS Referring Phys:  [id number] DAVID P FITZGERALD Diagnosing Phys: Evalene Lunger MD IMPRESSIONS  1. No valve vegetation noted. Valves well visualized.  2. Left ventricular ejection fraction, by estimation, is 55 to 60%. The left ventricle has normal function. The left ventricle has no regional wall motion abnormalities. Left ventricular diastolic parameters are indeterminate.  3. Right ventricular systolic function is normal. The right ventricular size is normal. There is moderately elevated pulmonary artery systolic pressure. The estimated right ventricular systolic  pressure is 52.1 mmHg.  4. The mitral valve is normal in structure. No evidence of mitral valve regurgitation. No evidence of mitral stenosis.  5. The aortic valve is normal in structure. Aortic valve regurgitation is mild. No aortic stenosis is present.  6. The inferior vena cava is normal in size with greater than 50% respiratory variability, suggesting right atrial pressure of 3 mmHg. FINDINGS  Left Ventricle: Left ventricular ejection fraction, by estimation, is 55 to 60%. The left ventricle has normal function. The left ventricle has no regional wall motion abnormalities. Strain was performed and the global longitudinal strain is indeterminate. The left ventricular internal cavity size was normal in size. There is no left ventricular hypertrophy. Left ventricular diastolic parameters are indeterminate. Right Ventricle: The right ventricular size is normal. No increase in right ventricular wall thickness. Right ventricular systolic function is normal. There is moderately elevated pulmonary artery systolic pressure. The tricuspid regurgitant velocity is 3.43 m/s, and with an assumed right atrial pressure of 5 mmHg, the estimated right ventricular systolic pressure is 52.1 mmHg. Left Atrium: Left atrial size was normal in size. Right Atrium: Right atrial size was normal in size. Pericardium: There is no evidence of pericardial effusion. Mitral Valve: The mitral valve is normal in structure. No evidence of mitral valve regurgitation. No evidence of mitral valve stenosis. Tricuspid Valve: The tricuspid valve is normal in structure. Tricuspid valve regurgitation is mild . No evidence of tricuspid stenosis. The aortic valve is normal in structure. Aortic valve regurgitation is mild. No aortic stenosis is present. Pulmonic Valve: The pulmonic valve was normal in structure. Pulmonic valve regurgitation is mild. No evidence of pulmonic stenosis. Aorta: The aortic root is normal in size and structure. Venous: The inferior  vena cava is normal in size with greater than 50% respiratory variability, suggesting right atrial pressure of 3 mmHg. IAS/Shunts: No atrial level shunt detected by color flow Doppler. Additional Comments: 3D was performed not requiring image post processing on an independent workstation and was indeterminate.  LEFT VENTRICLE PLAX 2D LVIDd:         4.30 cm LVIDs:         2.70 cm LV PW:         0.80 cm LV IVS:        0.80 cm LVOT diam:  2.10 cm LV SV:         38 LV SV Index:   20 LVOT Area:     3.46 cm  RIGHT VENTRICLE             IVC RV Basal diam:  4.40 cm     IVC diam: 2.10 cm RV S prime:     10.49 cm/s TAPSE (M-mode): 1.7 cm LEFT ATRIUM              Index        RIGHT ATRIUM           Index LA diam:        4.90 cm  2.60 cm/m   RA Area:     28.10 cm LA Vol (A2C):   60.6 ml  32.11 ml/m  RA Volume:   98.50 ml  52.19 ml/m LA Vol (A4C):   150.0 ml 79.48 ml/m LA Biplane Vol: 103.0 ml 54.58 ml/m  AORTIC VALVE LVOT Vmax:   71.60 cm/s LVOT Vmean:  45.400 cm/s LVOT VTI:    0.111 m AI PHT:      269 msec  AORTA Ao Root diam: 3.80 cm Ao Asc diam:  3.60 cm MV E velocity: 96.68 cm/s  TRICUSPID VALVE                            TR Peak grad:   47.1 mmHg                            TR Vmax:        343.00 cm/s                             SHUNTS                            Systemic VTI:  0.11 m                            Systemic Diam: 2.10 cm Evalene Lunger MD Electronically signed by Evalene Lunger MD Signature Date/Time: 07/30/2023/7:24:29 AM    Final    CT CHEST ABDOMEN PELVIS W CONTRAST Result Date: 07/27/2023 CLINICAL DATA:  Left-sided abdominal pain and chest pain. Active stage III lung cancer. EXAM: CT CHEST, ABDOMEN, AND PELVIS WITH CONTRAST TECHNIQUE: Multidetector CT imaging of the chest, abdomen and pelvis was performed following the standard protocol during bolus administration of intravenous contrast. RADIATION DOSE REDUCTION: This exam was performed according to the departmental dose-optimization program  which includes automated exposure control, adjustment of the mA and/or kV according to patient size and/or use of iterative reconstruction technique. CONTRAST:  80mL OMNIPAQUE  IOHEXOL  300 MG/ML  SOLN COMPARISON:  CT of the chest 04/08/2023. PET CT 03/21/2023. FINDINGS: CT CHEST FINDINGS Cardiovascular: The heart is enlarged. Aorta is normal in size. There are atherosclerotic calcifications of the aorta and coronary arteries. There is no pericardial effusion. Mediastinum/Nodes: Enlarged subcarinal lymph node measures 16 mm short axis, unchanged. No new enlarged lymph nodes are identified. Visualized esophagus and thyroid  gland are within normal limits. Lungs/Pleura: Small left pleural effusion is new from prior. There is new airspace disease in the medial left lower lobe. Left upper lobe/perihilar mass is again seen measuring 6.7 x 4.5 cm on image 2/18 there  is narrowing of the distal left mainstem bronchus and cutoff of left upper lobe bronchus as seen on the prior study. Airspace consolidation in the left upper lobe distal to the mass has increased. Left upper lobe pulmonary nodule measuring 6 mm image 4/41 appears new from prior. Scattered scarring in the right lung is unchanged. Moderate emphysema is stable. Scarring in the right lung apex is stable. Musculoskeletal: There are numerous healed inferior left rib fractures which are new from prior. CT ABDOMEN PELVIS FINDINGS Hepatobiliary: Coarse calcifications in the right lobe of the liver are unchanged. No new liver lesions. Gallbladder and bile ducts are within normal limits. Pancreas: Cystic area in the tail the pancreas measures 12 mm and appears unchanged. Pancreas is otherwise within normal limits. Spleen: Status post splenectomy. Adrenals/Urinary Tract: Bilateral renal atrophy present with cortical scarring. There are numerous subcentimeter hypodensities in the kidneys which are too small to characterize. There is no hydronephrosis or perinephric fluid. The  adrenal glands and bladder are within normal limits. Stomach/Bowel: Stomach is within normal limits. Appendix is not seen. No evidence of bowel wall thickening, distention, or inflammatory changes. Vascular/Lymphatic: 3.1 cm abdominal aortic aneurysm is unchanged. There are atherosclerotic calcifications of the aorta and iliac arteries. Prominent upper abdominal lymph nodes are again seen similar to prior. Reproductive: Prostate is unremarkable. Other: There are small fat containing inguinal hernias. There is no ascites. Musculoskeletal: Compression fracture of the superior endplate of L3 is age indeterminate, possibly acute or subacute. The bones are diffusely osteopenic. Degenerative changes affect the spine. IMPRESSION: 1. New small left pleural effusion. 2. New airspace disease in the medial left lower lobe worrisome for pneumonia. 3. Grossly unchanged left upper lobe/perihilar mass measuring 6.7 x 4.5 cm. Stable narrowing of the distal left mainstem bronchus and cutoff of the left upper lobe. 4. Airspace consolidation in the left upper lobe distal to the mass has increased. 5. New 6 mm left upper lobe pulmonary nodule. 6. Stable enlarged subcarinal lymph node. 7. Stable 3.1 cm abdominal aortic aneurysm. Recommend follow-up ultrasound every 3 years. 8. Stable 12 mm cystic area in the tail of the pancreas. Recommend follow-up MRI/MRCP in 2 years. 9. Compression fracture of the superior endplate of L3 is age indeterminate, possibly acute or subacute. 10. New subacute left lower rib fractures. Aortic Atherosclerosis (ICD10-I70.0) and Emphysema (ICD10-J43.9). Electronically Signed   By: Greig Pique M.D.   On: 07/27/2023 17:29    Microbiology: Results for orders placed or performed during the hospital encounter of 07/27/23  Blood culture (routine x 2)     Status: Abnormal (Preliminary result)   Collection Time: 07/27/23  4:35 PM   Specimen: BLOOD LEFT ARM  Result Value Ref Range Status   Specimen  Description   Final    BLOOD LEFT ARM Performed at [facility], [address] [place], [place] [address]    Special Requests   Final    BOTTLES DRAWN AEROBIC AND ANAEROBIC Blood Culture adequate volume Performed at [facility], [address] [place], [place] [address]    Culture  Setup Time   Final    GRAM POSITIVE COCCI AEROBIC BOTTLE ONLY CRITICAL VALUE NOTED.  VALUE IS CONSISTENT WITH PREVIOUSLY REPORTED AND CALLED VALUE. Performed at [facility], [address] [place], [place] [address]    Culture (A)  Final    STAPHYLOCOCCUS AUREUS SUSCEPTIBILITIES TO FOLLOW Performed at [facility], [address], [place], [place] [address]    Report Status  PENDING  Incomplete  Blood culture (routine x 2)     Status: Abnormal (Preliminary result)   Collection Time: 07/27/23  4:35 PM   Specimen: BLOOD RIGHT ARM  Result Value Ref Range Status   Specimen Description   Final    BLOOD RIGHT ARM Performed at [facility], [address] [place], [place] [address]    Special Requests   Final    BOTTLES DRAWN AEROBIC AND ANAEROBIC Blood Culture adequate volume Performed at [facility], [address] [place], [place] [address]    Culture  Setup Time   Final    GRAM POSITIVE COCCI AEROBIC BOTTLE ONLY CRITICAL RESULT CALLED TO, READ BACK BY AND VERIFIED WITH:  NATHAN BELUE AT 0031 07/29/23 JG    Culture (A)  Final    STAPHYLOCOCCUS AUREUS SUSCEPTIBILITIES TO FOLLOW Performed at [facility], [address] [place], [place] [address]    Report Status PENDING  Incomplete  Blood Culture ID Panel (Reflexed)     Status: Abnormal   Collection Time: 07/27/23  4:35 PM  Result Value Ref Range Status   Enterococcus faecalis NOT DETECTED NOT DETECTED Final   Enterococcus Faecium NOT DETECTED NOT DETECTED Final   Listeria monocytogenes NOT DETECTED NOT DETECTED Final   Staphylococcus species DETECTED (A) NOT DETECTED  Corrected    Comment: CRITICAL RESULT CALLED TO, READ BACK BY AND VERIFIED WITH:  NATHAN BELUE AT 0031 07/29/23 JG CORRECTED ON 06/24 AT 9297: PREVIOUSLY REPORTED AS DETECTED  NATHAN BELUE AT 0031 07/29/23 JG    Staphylococcus aureus (BCID) DETECTED (A) NOT DETECTED Final    Comment: Methicillin (oxacillin)-resistant Staphylococcus aureus (MRSA). MRSA is predictably resistant to beta-lactam antibiotics (except ceftaroline). Preferred therapy is vancomycin unless clinically contraindicated. Patient requires contact precautions if  hospitalized. CRITICAL RESULT CALLED TO, READ BACK BY AND VERIFIED WITH:  NATHAN BELUE AT 0031 07/29/23 JG    Staphylococcus epidermidis NOT DETECTED NOT DETECTED Final   Staphylococcus lugdunensis NOT DETECTED NOT DETECTED Final   Streptococcus species NOT DETECTED NOT DETECTED Final   Streptococcus agalactiae NOT DETECTED NOT DETECTED Final   Streptococcus pneumoniae NOT DETECTED NOT DETECTED Final   Streptococcus pyogenes NOT DETECTED NOT DETECTED Final   A.calcoaceticus-baumannii NOT DETECTED NOT DETECTED Final   Bacteroides fragilis NOT DETECTED NOT DETECTED Final   Enterobacterales NOT DETECTED NOT DETECTED Final   Enterobacter cloacae complex NOT DETECTED NOT DETECTED Final   Escherichia coli NOT DETECTED NOT DETECTED Final   Klebsiella aerogenes NOT DETECTED NOT DETECTED Final   Klebsiella oxytoca NOT DETECTED NOT DETECTED Final   Klebsiella pneumoniae NOT DETECTED NOT DETECTED Final   Proteus species NOT DETECTED NOT DETECTED Final   Salmonella species NOT DETECTED NOT DETECTED Final   Serratia marcescens NOT DETECTED NOT DETECTED Final   Haemophilus influenzae NOT DETECTED NOT DETECTED Final   Neisseria meningitidis NOT DETECTED NOT DETECTED Final   Pseudomonas aeruginosa NOT DETECTED NOT DETECTED Final   Stenotrophomonas maltophilia NOT DETECTED NOT DETECTED Final   Candida albicans NOT DETECTED NOT DETECTED Final   Candida auris NOT DETECTED NOT  DETECTED Final   Candida glabrata NOT DETECTED NOT DETECTED Final   Candida krusei NOT DETECTED NOT DETECTED Final   Candida parapsilosis NOT DETECTED NOT DETECTED Final   Candida tropicalis NOT DETECTED NOT DETECTED Final   Cryptococcus neoformans/gattii NOT DETECTED NOT DETECTED Final   Meth resistant mecA/C and MREJ DETECTED (A) NOT DETECTED Final    Comment: CRITICAL RESULT CALLED TO, READ  BACK BY AND VERIFIED WITH:  RANKIN DILLS AT 9968 07/29/23 JG Performed at [facility], [address] [place], [place] [address]   Expectorated Sputum Assessment w Gram Stain, Rflx to Resp Cult     Status: None   Collection Time: 07/28/23  1:00 PM   Specimen: Sputum  Result Value Ref Range Status   Specimen Description SPUTUM  Final   Special Requests NONE  Final   Sputum evaluation   Final    THIS SPECIMEN IS ACCEPTABLE FOR SPUTUM CULTURE Performed at [facility], [address] [place], [place] [address]    Report Status 07/29/2023 FINAL  Final  Culture, Respiratory w Gram Stain     Status: None (Preliminary result)   Collection Time: 07/28/23  1:00 PM   Specimen: SPU  Result Value Ref Range Status   Specimen Description   Final    SPUTUM Performed at [facility], [address] [place], [place] [address]    Special Requests   Final    NONE Reflexed from [id number] Performed at [facility], [address] [place], [place] [address]    Gram Stain   Final    FEW WBC PRESENT, PREDOMINANTLY PMN NO ORGANISMS SEEN Performed at [facility], [address], [place], [place] [address]    Culture PENDING  Incomplete   Report Status PENDING  Incomplete  MRSA Next Gen by PCR, Nasal     Status: None   Collection Time: 07/28/23  4:36 PM   Specimen: Nasal Mucosa; Nasal Swab  Result Value Ref Range Status   MRSA by PCR Next Gen NOT DETECTED NOT DETECTED Final    Comment: (NOTE) The GeneXpert MRSA Assay (FDA approved for NASAL specimens  only), is one component of a comprehensive MRSA colonization surveillance program. It is not intended to diagnose MRSA infection nor to guide or monitor treatment for MRSA infections. Test performance is not FDA approved in patients less than 38 years old. Performed at [facility], [address] [place], [place] [address]   Culture, blood (single) w Reflex to ID Panel     Status: None (Preliminary result)   Collection Time: 07/30/23  3:18 AM   Specimen: BLOOD RIGHT ARM  Result Value Ref Range Status   Specimen Description BLOOD RIGHT ARM  Final   Special Requests   Final    BOTTLES DRAWN AEROBIC AND ANAEROBIC Blood Culture adequate volume   Culture   Final    NO GROWTH < 12 HOURS Performed at [facility], [address] [place], [place] [address]    Report Status PENDING  Incomplete    Labs: CBC: Recent Labs  Lab 07/27/23 1541 07/28/23 0503 07/29/23 0520 07/30/23 0318  WBC 32.7* 40.9* 35.1* 30.5*  HGB 12.2* 11.1* 10.5* 10.4*  HCT 36.2* 32.9* 32.1* 31.4*  MCV 87.4 88.0 88.7 87.2  PLT 351 319 307 331   Basic Metabolic Panel: Recent Labs  Lab 07/27/23 1541 07/28/23 0503 07/29/23 0520 07/30/23 0318  NA 132* 134* 132* 129*  K 2.7* 3.5 3.6 3.7  CL 90* 99 97* 96*  CO2 29 24 24  20*  GLUCOSE 265* 190* 189* 175*  BUN 36* 32* 36* 51*  CREATININE 1.93* 1.77* 1.76* 2.15*  CALCIUM  8.8* 8.3* 8.8* 9.0  MG 1.9  --   --   --    Liver Function Tests: Recent Labs  Lab 07/27/23 1541 07/28/23 0503 07/29/23 0520  AST 19 17 17   ALT  13 12 13   ALKPHOS 46 41 50  BILITOT 1.8* 2.1* 1.1  PROT 7.9 6.7 6.6  ALBUMIN  3.3* 2.6* 2.4*   CBG: Recent Labs  Lab 07/29/23 0744 07/29/23 1155 07/29/23 1603 07/29/23 1946 07/30/23 0743  GLUCAP 197* 197* 188* 219* 166*    Discharge time spent: greater than 30 minutes.  Signed: Murvin Mana, MD [facility] 07/30/2023

## 2023-07-31 LAB — CULTURE, BLOOD (ROUTINE X 2)
Culture  Setup Time: NO GROWTH
Special Requests: ADEQUATE
Special Requests: ADEQUATE

## 2023-08-01 ENCOUNTER — Other Ambulatory Visit: Payer: Self-pay | Admitting: Family Medicine

## 2023-08-01 LAB — CULTURE, RESPIRATORY W GRAM STAIN

## 2023-08-01 MED ORDER — DAPAGLIFLOZIN PROPANEDIOL 10 MG PO TABS: 10.0000 mg | ORAL_TABLET | Freq: Every day | ORAL | 4 refills | Status: DC

## 2023-08-04 LAB — CULTURE, BLOOD (SINGLE)
Culture: NO GROWTH
Special Requests: ADEQUATE

## 2023-08-06 ENCOUNTER — Other Ambulatory Visit (HOSPITAL_COMMUNITY): Payer: Self-pay

## 2023-08-06 NOTE — Telephone Encounter (Signed)
 Pharmacy Patient Advocate Encounter  Received notification from [employer] that Prior Authorization for Mounjaro  2.5MG /0.5ML auto-injectors  has been APPROVED from 07/10/23 to 07/09/24. Ran test claim, Copay is $0. This test claim was processed through [facility]- copay amounts may vary at other pharmacies due to pharmacy/plan contracts, or as the patient moves through the different stages of their insurance plan.   PA #/Case ID/Reference #: [id number]

## 2023-08-11 ENCOUNTER — Other Ambulatory Visit: Payer: Self-pay | Admitting: Pharmacist

## 2023-08-11 DIAGNOSIS — E1122 Type 2 diabetes mellitus with diabetic chronic kidney disease: Secondary | ICD-10-CM

## 2023-08-11 NOTE — Progress Notes (Signed)
   08/11/2023  Patient ID: Bobby Thomas, male   DOB: 1940-12-05, 83 y.o.   MRN: [id number]  Receive voicemail from patient's wife requesting a call back.  Return call to spouse.  Advises that patient now receiving hospice services, including receiving his medications through hospice team and requests guidance on how to cancel shipments of Farxiga  from [employer] patient assistance program. - provide contact information for [employer] patient assistance program  Denies further medication questions or concerns today Provide contact information for clinic pharmacist to contact if needed in future for medication questions/concerns  Sharyle Sia, PharmD, [facility] [phone]

## 2023-08-15 ENCOUNTER — Ambulatory Visit: Admitting: Family Medicine

## 2023-08-25 ENCOUNTER — Ambulatory Visit: Admitting: Dermatology

## 2023-08-28 ENCOUNTER — Ambulatory Visit: Admitting: Dermatology

## 2023-09-05 DEATH — deceased

## 2023-09-24 ENCOUNTER — Other Ambulatory Visit: Admitting: Pharmacist

## 2023-12-10 ENCOUNTER — Other Ambulatory Visit: Admitting: Pharmacist
